# Patient Record
Sex: Male | Born: 1970 | Race: Black or African American | Hispanic: No | Marital: Married | Smoking: Former smoker
Health system: Southern US, Community
[De-identification: ages and names within clinical notes are randomized; demographics above are authoritative.]

## PROBLEM LIST (undated history)

## (undated) DIAGNOSIS — G709 Myoneural disorder, unspecified: Secondary | ICD-10-CM

## (undated) DIAGNOSIS — K5090 Crohn's disease, unspecified, without complications: Secondary | ICD-10-CM

## (undated) DIAGNOSIS — G35D Multiple sclerosis, unspecified: Secondary | ICD-10-CM

## (undated) DIAGNOSIS — I4891 Unspecified atrial fibrillation: Secondary | ICD-10-CM

## (undated) DIAGNOSIS — A4902 Methicillin resistant Staphylococcus aureus infection, unspecified site: Secondary | ICD-10-CM

## (undated) DIAGNOSIS — G35 Multiple sclerosis: Secondary | ICD-10-CM

## (undated) DIAGNOSIS — K5010 Crohn's disease of large intestine without complications: Secondary | ICD-10-CM

## (undated) DIAGNOSIS — Z9049 Acquired absence of other specified parts of digestive tract: Secondary | ICD-10-CM

## (undated) DIAGNOSIS — K56699 Other intestinal obstruction unspecified as to partial versus complete obstruction: Secondary | ICD-10-CM

## (undated) DIAGNOSIS — K624 Stenosis of anus and rectum: Secondary | ICD-10-CM

## (undated) DIAGNOSIS — Q250 Patent ductus arteriosus: Secondary | ICD-10-CM

## (undated) DIAGNOSIS — K603 Anal fistula: Secondary | ICD-10-CM

## (undated) DIAGNOSIS — Z86010 Personal history of colonic polyps: Secondary | ICD-10-CM

## (undated) HISTORY — DX: Multiple sclerosis, unspecified: G35.D

## (undated) HISTORY — DX: Patent ductus arteriosus: Q25.0

## (undated) HISTORY — DX: Unspecified atrial fibrillation: I48.91

## (undated) HISTORY — DX: Crohn's disease, unspecified, without complications: K50.90

## (undated) HISTORY — DX: Other intestinal obstruction unspecified as to partial versus complete obstruction: K56.699

## (undated) HISTORY — DX: Acquired absence of other specified parts of digestive tract: Z90.49

## (undated) HISTORY — DX: Personal history of colonic polyps: Z86.010

## (undated) HISTORY — DX: Stenosis of anus and rectum: K62.4

## (undated) HISTORY — DX: Myoneural disorder, unspecified: G70.9

## (undated) HISTORY — DX: Crohn's disease of large intestine without complications: K50.10

## (undated) HISTORY — DX: Multiple sclerosis: G35

## (undated) HISTORY — DX: Anal fistula: K60.3

## (undated) HISTORY — PX: COLONOSCOPY: SHX174

---

## 1994-10-24 DIAGNOSIS — K603 Anal fistula, unspecified: Secondary | ICD-10-CM

## 1994-10-24 HISTORY — DX: Anal fistula: K60.3

## 1994-10-24 HISTORY — DX: Anal fistula, unspecified: K60.30

## 2001-06-01 ENCOUNTER — Other Ambulatory Visit: Admission: RE | Admit: 2001-06-01 | Discharge: 2001-06-01 | Payer: Self-pay | Admitting: Gastroenterology

## 2001-06-01 ENCOUNTER — Encounter (INDEPENDENT_AMBULATORY_CARE_PROVIDER_SITE_OTHER): Payer: Self-pay | Admitting: Specialist

## 2005-01-20 ENCOUNTER — Ambulatory Visit: Payer: Self-pay | Admitting: Internal Medicine

## 2005-01-28 ENCOUNTER — Ambulatory Visit: Payer: Self-pay | Admitting: Cardiology

## 2005-02-16 ENCOUNTER — Ambulatory Visit: Payer: Self-pay | Admitting: Gastroenterology

## 2005-04-05 ENCOUNTER — Ambulatory Visit: Payer: Self-pay | Admitting: Gastroenterology

## 2005-04-14 ENCOUNTER — Ambulatory Visit: Payer: Self-pay | Admitting: Gastroenterology

## 2005-04-14 ENCOUNTER — Encounter (INDEPENDENT_AMBULATORY_CARE_PROVIDER_SITE_OTHER): Payer: Self-pay | Admitting: Specialist

## 2005-04-15 ENCOUNTER — Encounter: Payer: Self-pay | Admitting: Internal Medicine

## 2005-04-15 ENCOUNTER — Ambulatory Visit: Payer: Self-pay | Admitting: Gastroenterology

## 2005-04-15 ENCOUNTER — Inpatient Hospital Stay (HOSPITAL_COMMUNITY): Admission: EM | Admit: 2005-04-15 | Discharge: 2005-04-18 | Payer: Self-pay | Admitting: Emergency Medicine

## 2005-05-11 ENCOUNTER — Ambulatory Visit: Payer: Self-pay | Admitting: Gastroenterology

## 2005-06-13 ENCOUNTER — Encounter: Payer: Self-pay | Admitting: Internal Medicine

## 2005-06-23 ENCOUNTER — Encounter: Payer: Self-pay | Admitting: Internal Medicine

## 2005-08-04 ENCOUNTER — Encounter: Payer: Self-pay | Admitting: Internal Medicine

## 2006-01-04 ENCOUNTER — Ambulatory Visit: Payer: Self-pay | Admitting: Gastroenterology

## 2006-09-13 ENCOUNTER — Ambulatory Visit: Payer: Self-pay | Admitting: Gastroenterology

## 2006-11-26 ENCOUNTER — Emergency Department (HOSPITAL_COMMUNITY): Admission: EM | Admit: 2006-11-26 | Discharge: 2006-11-26 | Payer: Self-pay | Admitting: Family Medicine

## 2006-11-28 ENCOUNTER — Emergency Department (HOSPITAL_COMMUNITY): Admission: EM | Admit: 2006-11-28 | Discharge: 2006-11-28 | Payer: Self-pay | Admitting: Family Medicine

## 2007-01-11 ENCOUNTER — Ambulatory Visit: Payer: Self-pay | Admitting: Cardiology

## 2007-01-24 ENCOUNTER — Encounter: Payer: Self-pay | Admitting: Internal Medicine

## 2007-01-24 ENCOUNTER — Ambulatory Visit: Payer: Self-pay

## 2007-01-31 ENCOUNTER — Encounter: Admission: RE | Admit: 2007-01-31 | Discharge: 2007-05-01 | Payer: Self-pay | Admitting: Neurology

## 2008-12-11 ENCOUNTER — Ambulatory Visit: Payer: Self-pay | Admitting: Internal Medicine

## 2008-12-11 ENCOUNTER — Ambulatory Visit: Payer: Self-pay | Admitting: Family Medicine

## 2008-12-11 DIAGNOSIS — K5010 Crohn's disease of large intestine without complications: Secondary | ICD-10-CM

## 2008-12-11 DIAGNOSIS — G35 Multiple sclerosis: Secondary | ICD-10-CM

## 2008-12-15 ENCOUNTER — Ambulatory Visit: Payer: Self-pay | Admitting: Cardiovascular Disease

## 2009-01-07 ENCOUNTER — Ambulatory Visit: Payer: Self-pay | Admitting: Internal Medicine

## 2009-01-14 ENCOUNTER — Ambulatory Visit: Payer: Self-pay | Admitting: Internal Medicine

## 2009-01-14 ENCOUNTER — Encounter: Payer: Self-pay | Admitting: Internal Medicine

## 2009-01-22 ENCOUNTER — Encounter: Payer: Self-pay | Admitting: Internal Medicine

## 2009-02-19 ENCOUNTER — Encounter: Payer: Self-pay | Admitting: Internal Medicine

## 2009-02-23 ENCOUNTER — Telehealth: Payer: Self-pay | Admitting: Internal Medicine

## 2009-03-11 ENCOUNTER — Ambulatory Visit: Payer: Self-pay | Admitting: Internal Medicine

## 2009-03-14 ENCOUNTER — Encounter: Payer: Self-pay | Admitting: Internal Medicine

## 2009-04-08 ENCOUNTER — Encounter: Payer: Self-pay | Admitting: Internal Medicine

## 2010-10-21 ENCOUNTER — Telehealth: Payer: Self-pay | Admitting: Internal Medicine

## 2010-10-22 ENCOUNTER — Encounter (INDEPENDENT_AMBULATORY_CARE_PROVIDER_SITE_OTHER): Payer: Self-pay | Admitting: *Deleted

## 2010-11-21 LAB — CONVERTED CEMR LAB
Basophils Absolute: 0 10*3/uL (ref 0.0–0.1)
Basophils Relative: 0.1 % (ref 0.0–3.0)
CO2: 31 meq/L (ref 19–32)
Creatinine, Ser: 1 mg/dL (ref 0.4–1.5)
GFR calc Af Amer: 107 mL/min
GFR calc non Af Amer: 88 mL/min
Glucose, Bld: 94 mg/dL (ref 70–99)
HCT: 39.9 % (ref 39.0–52.0)
Hemoglobin: 13.6 g/dL (ref 13.0–17.0)
Lymphocytes Relative: 20.3 % (ref 12.0–46.0)
MCHC: 34.2 g/dL (ref 30.0–36.0)
Monocytes Relative: 5.1 % (ref 3.0–12.0)
Neutro Abs: 4.1 10*3/uL (ref 1.4–7.7)
RBC: 4.77 M/uL (ref 4.22–5.81)
Total Bilirubin: 1.5 mg/dL — ABNORMAL HIGH (ref 0.3–1.2)
Vit D, 25-Hydroxy: 16 ng/mL — ABNORMAL LOW (ref 30–89)

## 2010-11-25 NOTE — Letter (Signed)
Summary: Appt Reminder [facility]  [place], [place] [address]   Phone: [phone]  Fax: [phone]        October 22, 2010 MRN: [id number]    JOHNTHAN AXTMAN [address] [place], [place]  [address]    Dear Mr. Partin,   You have a return appointment with Dr. Carlean Purl on 11/22/2010 at 1:30pm. Please remember to bring a complete list of the medicines you are taking, your insurance card and your co-pay. If you have to cancel or reschedule this appointment, please call before 5:00 pm the evening before to avoid a cancellation fee. If you have any questions or concerns, please call [phone].    Sincerely,    Bernita Buffy CMA (AAMA)

## 2010-11-25 NOTE — Progress Notes (Signed)
Summary: recall project  ---- Converted from flag ---- ---- 10/21/2010 11:49 AM, Bernita Buffy CMA (AAMA) wrote: this patient is on the recall project list, the last office visit does not mention a recall colonoscopy. Does he still need a colonoscopy? ------------------------------  Phone Note Outgoing Call Call back at [facility] Phone [phone]   Call placed by: Bernita Buffy CMA Deborra Medina),  October 21, 2010 2:47 PM Call placed to: Patient Summary of Call: he needs a phone call to see if he plans to return to our practice and if so an office visit. If he has had other care at [facility], etc, will need records.  Initial call taken by: Gatha Mayer MD, [facility]  October 21, 2010 12:04 PM  Follow-up for Phone Call        Left a message on patients machine to call back. Bernita Buffy CMA Deborra Medina)  October 21, 2010 2:48 PM   spoke to pt he states that the last time he saw a MD at [facility] was 03/2009 and he has been dealing with his MS. I have made him an office visit with Dr. Carlean Purl for the first open slot on 11/22/2010 and I will be mailing him a reminder letter as well.  Follow-up by: Bernita Buffy CMA Deborra Medina),  October 22, 2010 12:27 PM

## 2010-12-03 ENCOUNTER — Encounter: Payer: Self-pay | Admitting: Internal Medicine

## 2010-12-03 ENCOUNTER — Ambulatory Visit (INDEPENDENT_AMBULATORY_CARE_PROVIDER_SITE_OTHER): Payer: Medicare Other | Admitting: Internal Medicine

## 2010-12-03 DIAGNOSIS — G35 Multiple sclerosis: Secondary | ICD-10-CM

## 2010-12-03 DIAGNOSIS — K5010 Crohn's disease of large intestine without complications: Secondary | ICD-10-CM

## 2010-12-03 DIAGNOSIS — K56609 Unspecified intestinal obstruction, unspecified as to partial versus complete obstruction: Secondary | ICD-10-CM

## 2010-12-09 NOTE — Assessment & Plan Note (Addendum)
Summary: Discuss Colonoscopy   History of Present Illness Visit Type: Follow-up Visit Primary GI MD: Silvano Rusk MD [facility] Primary Provider: Juluis Pitch, MD Requesting Provider: n/a Chief Complaint: Discuss Colonoscopy, No GI complaints History of Present Illness:   40 yo African American man last seen in 2010 at which time he was sent to [facility] for surgical consultation. He has a chronic left colon stricture that makes colonoscopy impossible to complete. He was and is not interested in pursuing surgery. He continues on Avonex for his Crohn's and saays he has no bowel or abdominal problems.  He is now on disability due to MS. He had MS relapse 7 months ago but lately has been ok (no numbness, dizziness and balance problems).   GI Review of Systems      Denies abdominal pain, acid reflux, belching, bloating, chest pain, dysphagia with liquids, dysphagia with solids, heartburn, loss of appetite, nausea, vomiting, vomiting blood, weight loss, and  weight gain.        Denies anal fissure, black tarry stools, change in bowel habit, constipation, diarrhea, diverticulosis, fecal incontinence, heme positive stool, hemorrhoids, irritable bowel syndrome, jaundice, light color stool, liver problems, rectal bleeding, and  rectal pain.    Current Medications (verified): 1)  Avonex 30 Mcg/vial Kit (Interferon Beta-1a) .Marland Kitchen.. 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  Alcohol Use - no Illicit Drug Use - no Occupation: disabled, [employer] in past Married, son (1)  and daughter (2) Daily Caffeine Use - 2-3 sodas  caffeine free Patient does not get regular exercise.   Social History: Patient has never smoked.  Alcohol Use - no Illicit Drug Use - no Occupation: disabled, [employer] in past Married, son (1)  and daughter (2) Daily Caffeine Use - 2-3 sodas  caffeine free Patient does not get regular exercise.   Vital Signs:  Patient profile:   40 year old male Height:      72 inches Weight:      181 pounds BMI:     24.64 BSA:     2.04 Pulse rate:   84 / minute Pulse rhythm:   regular BP sitting:   118 / 58  (left arm)  Vitals Entered By: Jonathon Vazquez) (December 03, 2010 10:47 AM)  Physical Exam  General:  Well developed, well nourished, no acute distress. Eyes:  anicteric Lungs:  Clear throughout to auscultation. Heart:  Regular rate and rhythm; no murmurs, rubs,  or bruits. Abdomen:  Soft, nontender and nondistended. No masses, hepatosplenomegaly or hernias noted. Normal bowel sounds.   Impression & Recommendations:  Problem # 1:  CROHN'S DISEASE, LARGE INTESTINE (ICD-555.1) Assessment Unchanged This appears improved and under some control on Avonex (takes for multiple sclerosis). The descending colon stricture is his main problem but that seems asymptomatic. Main issue is the cancer risk there and inability to screen and due surveillance. Avonex has been used in Crohn's trials but is not established therapy. However, I think he is getting benefit and would not add other medication  at this time. He should have a colonoscopy attempted and biopsies of the stricture taken given malignancy poetential. He says he will call back to do in April. Will request labs from [facility].  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 Understandable though it limits ability to adequately screen and do surveillance of the colon.   Problem # 3:  MULTIPLE SCLEROSIS (ICD-340) Assessment: Unchanged Seems ok for the most part on Avonex, though he is now disabled due to [place].  Patient Instructions: 1)  You are due for a colonoscopy. We will contact you in 2-3 weeks once we get our schedule out for April and will discuss colonoscopy at that time with you. 2)  We will obtain labs from the last 6 months from [facility]. 3)  The medication list was reviewed and reconciled.  All changed / newly prescribed medications were explained.  A complete medication list was provided to the patient / caregiver. cc: W. Margaretmary Eddy, MD and [facility].  Appended Document: Discuss Colonoscopy we asked local neuro and his PCP no labs in last year will obtain when he returns for colonoscopy

## 2010-12-22 ENCOUNTER — Telehealth: Payer: Self-pay | Admitting: Internal Medicine

## 2010-12-28 ENCOUNTER — Encounter: Payer: Self-pay | Admitting: Internal Medicine

## 2011-01-04 NOTE — Progress Notes (Signed)
Summary: Schedule Colonoscopy  Phone Note Outgoing Call   Call placed by: Madlyn Frankel CMA Deborra Medina),  December 22, 2010 9:01 AM Call placed to: Patient Summary of Call: Left message on patient's voicemail for him to call back. Patient needs to schedule colonoscopy as per Dr Celesta Aver recommendations at patients recent office visit. Initial call taken by: Madlyn Frankel CMA Deborra Medina),  December 22, 2010 9:02 AM  Follow-up for Phone Call        I have left a message for the patient to call back. Dottie Nelson-Thull CMA Deborra Medina)  December 24, 2010 8:13 AM   I have left a message for the patient to call back. Dottie Nelson-Ruegg CMA Deborra Medina)  December 27, 2010 11:50 AM   Additional Follow-up for Phone Call Additional follow up Details #1::        3 attempts to reach patient. No return call. I will send a letter to patient's home address. Dottie Nelson-Barney CMA Deborra Medina)  December 28, 2010 12:08 PM

## 2011-01-04 NOTE — Letter (Signed)
Summary: Colonoscopy Letter  [facility]  [place], [place] [address]   Phone: [phone]  Fax: [phone]      December 28, 2010 MRN: [id number]   Jonathon Vazquez, [place]  [address]   Dear [name],   According to your medical record, it is time for you to schedule a Colonoscopy. Our office has tried to contact you on multiple occasions to schedule this but unfortunately, we have been unable to do so. The American Cancer Society recommends this procedure as a method to detect early colon cancer. Patients with a family history of colon cancer, or a personal history of colon polyps or inflammatory bowel disease are at increased risk.  This letter has been generated based on the recommendations made at the time of your procedure. If you feel that in your particular situation this may no longer apply, please contact our office.  Please call our office at [phone] to schedule this appointment or to update your records at your earliest convenience.  Thank you for cooperating with [place] to provide you with the very best care possible.   Sincerely,    [facility] [phone]  Appended Document: Colonoscopy Letter letter mailed to patient's home address.

## 2011-03-11 NOTE — H&P (Signed)
Jonathon Vazquez, ROHRBACH NO.:  [id number]   MEDICAL RECORD NO.:  [id number]          PATIENT TYPE:  INP   LOCATION:  3014                         FACILITY:  [facility]   PHYSICIAN:  Norberto Sorenson T. Fuller Plan, M.D. [facility] OF BIRTH:  September 03, 1971   DATE OF ADMISSION:  04/14/2005  DATE OF DISCHARGE:                                HISTORY & PHYSICAL   GASTROENTEROLOGIST: Dr. Lyla Son.   CHIEF COMPLAINT:  Temperatures with abdominal pain and nausea, vomiting,  following a colonoscopy earlier on the day of admission.   HISTORY OF PRESENT ILLNESS:  This is a very nice, 40 year old African-  American gentleman with an approximately 11-year history of Crohn's disease.  Apparently he has had some fistulous disease in the past but has never been  on Remicade. He has been taking Asacol for a long time and has required  steroids in the past, but since being transitioned over to a daily dosage of  75 mg of 6-MP, he has not needed steroids. He has been pretty asymptomatic  from the stand-point of his Crohn's with no significant diarrheal episodes,  gaining weight, having a good appetite and no constitutional symptoms. He is  able to hold down a full time and a part-time job.   Yesterday he underwent a screening colonoscopy by Dr. Lyla Son. During  that colonoscopy Dr. Inocente Salles found active Crohn's disease and several areas of  stricturing and stenosis associated with the Crohn's disease at one point.  We believe this was in the ascending colon. He was unable to pass the scope  beyond the strictured area. At its most severe, the Crohn's disease was  described as severe. Biopsies were obtained. In the postoperative area he  did pass some bloody diarrhea before he went home.   The patient went home from the endoscopy center and that evening (Thursday,  April 14, 2005) developed a temperature of 101 degrees. His wife, who is an  R. N. at [employer], and also does relief nursing at [employer] on the rehab unit, called Dr. Fuller Plan covering for GI service. Dr.  Fuller Plan recommended Tylenol. He did say that if the patient developed any  further abdominal pain, bleeding or nausea and vomiting, or if the fevers  persisted, that she should call Dr. Fuller Plan back. At the time of that first  call the patient was not having any GI symptoms, just the fever. Shortly  thereafter he developed chills and right-sided abdominal pain, proceeding  then to nausea and vomiting. The family was then in contact again with Dr.  Fuller Plan and it was recommended that he come to the emergency room for further  evaluation. A CT scan of the abdomen did reveal left-sided colitis. Plain  abdominal films as well as the CT scan did not confirm any perforation. Once  the patient got to the emergency room he did pass a large amount of gas,  which he had been unable to do at home, and he did feel better. His  temperature initially in the ER was 104.8 with a pulse of 115 and  blood  pressure 118/61. At one point in the emergency room his blood pressure did  go as low as 84/30.   Dr. Fuller Plan evaluated the patient and admitted him for further work up and  observation. Note that Dr. Velora Heckler had wanted to increase this patient's  dose of 6-MP from 75 mg daily up to 100 mg daily but the patient has not  started this increased dose yet.   PAST MEDICAL HISTORY:  Crohn's disease. Has not required surgery in the  past.   CURRENT MEDICATIONS:  1.  Asacol 1.2 grams p.o. t.i.d.  2.  6-MP 75 mg daily but was to be increased to 100 mg daily.  3.  Multivitamin once daily when he remembers but this is usually 2-3 times      a week.   ALLERGIES:  No known drug or environmental allergies.   SOCIAL HISTORY:  The patient works at the [employer] in [place],  [place]. He also works as a Adult nurse. Does not drink alcohol.  He lives with his wife. He has a 22-year-old child from a prior marriage but  this child  does not live with the patient.   FAMILY HISTORY:  Several of his grandparents had diabetes mellitus type 2.  His paternal grandfather had prostate cancer. His paternal grandmother has  renal cell carcinoma and diabetes mellitus type 2.   REVIEW OF SYSTEMS:  CONSTITUTIONAL:  Fevers, sweats, rigors as described  above. Some weakness, no presyncope. GASTROINTESTINAL: Abdominal pain has  predominantly been on the right side of the abdomen but seems to be  radiating over to the left side at times. It is probably about a 7 or 8 out  of 10 in severity. UROLOGIC:  The patient is urinating well, has not noticed  any oliguria. GENERAL: The patient reports about a 10 pound weight gain in  the last year. ENDOCRINE:  The patient denies any personal history of  diabetes, excessive thirst or excessive urination. CARDIOVASCULAR:  No  palpitations, no history of angina or chest pain. No peripheral edema. The  patient does report he had a heart murmur as a child.  MUSCULOSKELETAL:  No  arthralgias. The patient does not exercise regularly.   PHYSICAL EXAMINATION:  GENERAL:  The patient is a pleasant, mildly  uncomfortable African-American man who looks non toxic. He is a good  historian.  VITAL SIGNS:  Blood pressure is 106/44, pulse is 70, respirations 20,  temperature max 104.8. By the time of the actual exam by Dr. Fuller Plan, 98.5.  HEENT:  Sclerae are non icteric. Conjunctivae pink. Extraocular movements  are intact.  Oropharynx is moist and clear. Dentition in good repair.  NECK:  No jugular venous distension, no masses, no bruits.  CHEST:  Clear to auscultation and percussion with excellent breath sounds,  no cough and no dyspnea with speech.  HEART:  Regular rate and rhythm, questionable systolic click.  ABDOMEN:  Mild right abdominal tenderness. The abdomen is soft and  nondistended. No guarding or rebound. No masses or hepatosplenomegaly and no  hernia is noted. RECTAL EXAM:  Deferred as he just  had a colonoscopy on April 14, 2005.  NEUROLOGIC EXAM:  The patient is alert and oriented x3. He is moving all  four extremities. Grip and pedal strength are 5/5 bilaterally. No tremor is  noted.  PSYCHIATRIC:  The patient is a little bit subdued but does not appear  depressed. He is appropriate.  GU:  Exam was deferred.   IMAGING  STUDIES:  Plain abdominal films show no obstruction or free air but  some fluid in the proximal colon. Abdomen and pelvic CT shows two segments  of colonic wall thickening with inflammatory edematous type changes in the  descending colon. No free air or bowel distention.   LABORATORY DATA:  White blood cell count 8 thousand, hemoglobin 13.5,  hematocrit 40.7. MCV 88.7. Platelets 149,000. Urinalysis: no nitrites, no  leukocyte esterase, negative for protein, 15 mg of ketones are present.  There is a small amount of bilirubin, no glucose present and specific  gravity is within normal limits. Sodium is 135, potassium is 2.8, chloride  is 105. CO2 23. Glucose 103. BUN is 14, creatinine is 1.2. Total bilirubin  2.0, alkaline phosphatase 74, AST 20, ALT 18. Albumin is 3.4. Lipase is 31.   IMPRESSION:  1.  Crohn's disease with colonoscopy within the last 24 hours revealing      active Crohn's and stricturing. Now with high fever following      colonoscopy, and right-sided abdominal pain along with nausea and      vomiting. Rule out bacterial translocation/bacteremia, sepsis. Currently      the white blood cell count is normal. There is no evidence for any      perforation of the bowel.  2.  Hypokalemia.   PLAN:  Admit patient to the hospital on a non monitored bed. Obtain blood  cultures. Will begin empiric antibiotics, specifically Primaxin. Will  continue his oral Asacol and 6-MP. Will supplement potassium in his IV  fluids. Plan to repeat labs: CBC and BMET along with sedimentation rate in  several hours.       SG/MEDQ  D:  04/15/2005  T:  04/15/2005   Job:  [id number]

## 2011-03-11 NOTE — Discharge Summary (Signed)
NAMEJHASE, Jonathon Vazquez NO.:  [id number]   MEDICAL RECORD NO.:  [id number]          PATIENT TYPE:  INP   LOCATION:  3014                         FACILITY:  [facility]   PHYSICIAN:  Delfin Edis, M.D. [facility]  DATE OF BIRTH:  1971-09-21   DATE OF ADMISSION:  04/14/2005  DATE OF DISCHARGE:  04/18/2005                                 DISCHARGE SUMMARY   ADMISSION DIAGNOSES:  1.  High fevers, abdominal pain, nausea and vomiting following colonoscopy.  2.  Colonoscopy on April 14, 2005, revealing active Crohn's disease and      stricturing.  3.  Hypokalemia.  4.  Longstanding history of Crohn's disease. Has not required surgery in the      past.   DISCHARGE DIAGNOSES:  1.  Fevers, abdominal pain, nausea and vomiting following colonoscopy.      Question transient bacteremia from translocation of bacteria via the      colon. Fevers resolved. White blood cell count normal throughout the      hospital stay. No evidence for Clostridium difficile colitis. Imaging      studies unremarkable. Fevers and symptoms resolved.  2.  Hypokalemia, corrected, resolved.   CONSULTATIONS:  None.   PROCEDURES:  None.   BRIEF HISTORY:  Jonathon Vazquez is a very nice, 40 year old, American gentleman.  He has about an 11-year history of Crohn's disease and says that he has had  some fistulous disease in the past, but has not been treated with Remicade  previously. He has been on Asacol for many years and has required steroids  in the past. However, he was started on 6MP about a year ago and has been  able to taper off steroid use as a result. Crohn's disease has been quiet  for several months with no diarrheal episodes and no bloody stools. He has  been gaining weight and appetite is good. The patient underwent screening  colonoscopy by Dr. Lyla Son on April 14, 2005. Findings included active  Crohn's disease and several areas of stricturing and stenosis associated  with the Crohn's disease.  Apparently in the descending colon, Dr. Velora Heckler  was unable to pass the scope beyond a strictured region. At its most severe,  the disease was described as severe. Biopsies were obtained, but are still  pending. He had some bloody diarrhea before he went home from the  colonoscopy. That very evening, the patient developed an initial temperature  spike of 101 degrees and Dr. Fuller Plan, on call, was contacted. He advised them  to use some Tylenol, but to call back if there are any other problems. A few  hours later, the patient's wife called saying the patient was having chills  and was now having right-sided abdominal pain, as well as nausea and  vomiting. The patient was advised to come to the emergency room. On initial  x-rays, there was no evidence for any free air or perforation. There was a  nonobstructive bowel gas pattern and some fluid contents in the proximal  colon. CT scan showed focal colitis in the descending colon. Again there was  no evidence for any perforation or obstructive pattern. In the emergency  room, his initial temperature reading was 104.8 degrees. His white blood  cell count was, however, normal, though the differential did reveal a left  shift. Urinalysis was negative. The patient was quite uncomfortable and Dr.  Fuller Plan admitted him for observation and supportive care. Blood cultures were  obtained and the patient was then started on Primaxin.   LABORATORIES:  Clostridium difficile toxin assay was negative. The initial  white blood cell count was 8, it was 4.2 at discharge. The hematocrit was  38.7 and MCV 87.5. The differential on white blood cells showed a left shift  with 93% neutrophils, though the absolute count normal and lymphocytes  decreased at 2% with the absolute count also decreased. Other white cell  lines were within normal limits. The sedimentation rate was minimally  elevated at 18. Sodium 135. Initial potassium 2.8, corrected to 3.8.  Chloride 105,  CO2 23, glucose 103, BUN 14, creatinine 1.2, calcium 8.3,  albumin 3.4. Total bilirubin 2, alkaline phosphatase 74, AST 20, ALT 18.  Lipase 31. Urinalysis revealed no nitrites, no leukocyte esterase and  negative for protein. There were 15 mg of ketones. Specific gravity within  normal limits. No growth on blood cultures. Urine culture also with no  growth. Clostridium difficile toxin assay was checked twice and was both  times negative.   IMAGING STUDIES:  Described above in history of present illness.   HOSPITAL COURSE:  The patient was admitted to a nontelemetry bed. Within the  first 16 hours, the patient did continue to have temperatures up to 102.4  degrees, but never surpassed the initial temperature of 104.8 degrees  registered in the emergency room. On April 16, 2005, and thereafter, he was  afebrile. Laboratories as outlined above were unremarkable as to source of  the patient's fever.   Within the first 18 hours or so, he had diarrhea all night long, but this,  along with the fevers resolved within 24 hours. Diet was slowly advanced  from ice chips and sips up to a low-residue diet. He tolerated diet  progression without problems. Primaxin, started empirically, was continued  for the duration of his hospital stay, but that it was not felt that he  would need antibiotics when discharge as he had improved so rapidly and had  remained afebrile  for more than 48 hours.   During this hospitalization the patient's 6MP dosage was increased from the  previous 75 mg a day up to 100 mg a day which had been recommended by Dr.  Lyla Son. Levels of these drugs will need to be checked within the next  month or so. Other considerations for future treatment in this patient are  Remicade or Humira. All in all, the patient's condition improved and he was  stable for discharge on April 18, 2005. The overall working diagnosis as to cause of his acute fevers and GI symptoms was transient  bacteremia from  translocation of intestinal bacteria.   MEDICATIONS AT DISCHARGE:  1.  Asacol 400 mg four tablets three times daily.  2.  MP6 50 mg two tablets daily.       SG/MEDQ  D:  04/18/2005  T:  04/18/2005  Job:  [id number]   cc:   Clarene Reamer, M.D. [facility]

## 2011-03-11 NOTE — Letter (Signed)
September 13, 2006    Candis Shine, M.D.  [facility]  [address], [place] [address]   RE:  Jonathon Vazquez, Jonathon Vazquez  MRN:  [id number]  /  DOB:  1971-01-18   Dear Mechele Claude:   I would appreciate it if you would see this very nice young man, Jonathon Vazquez, some time in the near future.  I told him you probably could see him  some time in January or early February.  I just wanted to make sure that his  condition is not worsening.  He is so stoic that it is hard to really know  where he stands with his Crohn's.  Hopefully he is doing well with the 6-MP  but it is hard to be sure.   Again, thank you for your care.    Sincerely,      Clarene Reamer, MD  Electronically Signed    SML/MedQ  DD: 09/13/2006  DT: 09/13/2006  Job #: [id number]   CC:    along with this letter, both done today. Send copy of office note on  this patient

## 2011-03-11 NOTE — Assessment & Plan Note (Signed)
[place] OFFICE NOTE   GANNON, HEINZMAN                        MRN:          [id number]  DATE:01/11/2007                            DOB:          [birth year]    Mr. Bernards is a 40 year old male with past medical history of Crohn's as  well as a patent ductus arteriosus we were asked to evaluate prior to  consideration of life insurance.  The patient does have cardiac history  and I saw the patient in 1998.  Note that at that time he had been seen  in [place] and an electrocardiogram revealed atrial fibrillation with a  controlled ventricular response.  He did have an echocardiogram on Feb 27, 1997.  He was found to have low-normal LV function with an estimated  ejection fraction of 50-55%.  There was mild left ventricular  hypertrophy and mild left ventricular enlargement.  The right atrium and  right ventricle were felt to be mildly enlarged.  The pulmonary artery  was also dilated.  By color, there was a patent ductus arteriosus.  We  did refer the patient to [facility] so that he could see Dr. Jenne Pane for this PDA.  He apparently was seen but I do not have those  records.  He was told that Dr. Aline Brochure did find his PDA but felt that  it did not need therapy as there were no symptoms.  He has not followed  up at [facility] or in this clinic since.  The patient recently  applied for life insurance and they asked for him to be evaluated prior  to that.  Note he denies any dyspnea on exertion, orthopnea, PND, pedal  edema, palpitations, presyncope, syncope, or exertional chest pain.   His medications include:  1. Lialda 1.2 p.o. daily.  2. Mercaptopurine 50 mg tablets two p.o. daily.  3. Multivitamin one p.o. daily.   PAST MEDICAL HISTORY:  There is no diabetes mellitus, hypertension or  hyperlipidemia.  He does have a history of Crohn's disease.  He also has  a history of atrial  fibrillation when I saw him in May 1998.  He has a  history of a patent ductus arteriosus.  There is no previous surgical  history.   FAMILY HISTORY:  Negative for coronary artery disease.   SOCIAL HISTORY:  He has a remote history of tobacco use but he does not  consume alcohol or use drugs.   REVIEW OF SYSTEMS:  He denies any headaches or fevers or chills.  There  is no productive cough or hemoptysis.  There is no productive cough or  hemoptysis.  There is no dysphagia, odynophagia, or melena.  He does  occasionally have hematochezia related to his Crohn's.  There is no  dysuria or hematuria.  There is no rash or seizure activity.  There is  no orthopnea or PND noted.  There is no claudication noted.  The  remaining systems are negative.   PHYSICAL EXAMINATION:  VITAL SIGNS:  Shows a blood pressure 125/73 and  his pulse  is 15.  He weighs 200 pounds.  GENERAL:  He is well-developed and well-nourished in no acute distress.  His skin is warm and dry.  He does not appear to be depressed and there  is no gross focal abnormality.  HEENT:  Unremarkable with normal eyelids.  NECK:  Supple with an normal upstroke bilaterally and there are no  bruits noted.  There is no jugular venous distention and I cannot  appreciate thyromegaly.  CHEST:  Clear to auscultation with normal expansion.  CARDIOVASCULAR:  Reveals a regular rate and rhythm with a normal S1.  S2  appears to be widely split.  I cannot appreciate murmurs.  There is no  S3 or S4.  I can appreciate no RV heave and his PMI is nondisplaced.  ABDOMEN:  Not tender or distended, positive bowel sounds.  No  hepatosplenomegaly, no mass appreciated.  There is no abdominal bruit.  He has 2+ femoral pulses bilaterally and no bruits.  EXTREMITIES:  Show no edema and I can palpate no cords.  He has 2+  posterior tibial pulses bilaterally.  NEUROLOGIC:  Grossly intact.   His electrocardiogram shows a sinus rhythm at a rate of 91 and there  are  no significant ST changes noted.   DIAGNOSES:  1. History of patent ductus arteriosus.  2. Remote history of atrial fibrillation.  3. Crohn's disease.   PLAN:  Mr. Geissinger presents for evaluation of his patent ductus arteriosus  prior to consideration of life insurance.  Note he has no symptoms  including no dyspnea, palpitations, chest pain or syncope.  On exam  today I do not hear a PDA.  We will schedule him to have an  echocardiogram to reassess his PDA and also obtain records from his  evaluation at [facility] in 1998.  I think if his PDA has closed  then no further cardiac would be indicated.  We will see him back in  approximately 2 years.     Denice Bors Stanford Breed, MD, [facility]  Electronically Signed    BSC/MedQ  DD: 01/11/2007  DT: 01/11/2007  Job #: [id number]   cc:   Robyn Haber, M.D.

## 2011-03-11 NOTE — Assessment & Plan Note (Signed)
[place] OFFICE NOTE   Jonathon Vazquez, Jonathon Vazquez                        MRN:          [id number]  DATE:09/13/2006                            DOB:          Oct 10, 1971    He has Crohn's colitis.  He has been evaluated by Dr. Candis Shine down at  [facility] and there was a question about Remicade but we have kept him on 100 mg  of 6-MP and he says he has been taking it along with some Asacol four t.i.d.  and a stool softener.  He says he has been feeling well, that is why he has  not been back.  He just needs refills on his medication.   PHYSICAL EXAMINATION TODAY:  VITAL SIGNS:  He weighed 189, blood pressure  106/62, pulse 68.  GENERAL:  Awake, alert, appeared very good.  HEENT:  His oropharynx was negative.  NECK:  Negative.  CHEST:  Clear.  HEART:  Revealed regular rhythm without significant murmur.  ABDOMEN:  Soft, no mass or organomegaly.  SKIN:  Clear.  EXTREMITIES:  Unremarkable.  RECTAL:  Deferred.   IMPRESSION:  1. Crohn's colitis, responding nicely to the 6-MP it seems, although Jonathon Vazquez      is quite stoic.  2. History of paroxysmal atrial fibrillation.  3. History of mitral stenosis in the past.  4. Gastroesophageal reflux disease.   RECOMMENDATION:  The patient is to continue the same medication.  I do think  he needs to follow up with Dr. Redmond Pulling down at [facility] and they may think he  needs a repeat colonoscopic examination to reassess his condition.  Jonathon Vazquez is  so stoic it is hard to really know where we stand on his Crohn's.  I do  worry that he could be getting worsening stenosis and he may require surgery  in the future.  I told him I would like to see him back some time after he  sees Dr. Redmond Pulling for followup prior to my retiring in June so that we can  discuss his followup care in the future.  In the meantime, I would keep him  on the same medications.  I have given him refills for this and get  routine  labs on him as well, and he is to call us if he has any change in his  symptomatology.     Clarene Reamer, MD  Electronically Signed    SML/MedQ  DD: 09/13/2006  DT: 09/13/2006  Job #: [id number]   cc:   Candis Shine, MD

## 2011-03-11 NOTE — Letter (Signed)
February 08, 2007     RE:  HOLT, WOOLBRIGHT  MRN:  [id number]  /  DOB:  07-05-1971   To Whom It May Concern:   Reda Gettis is a 40 year old male patient of mine in [place], [place] at [facility] The patient has a patent ductus  arteriosus and wants to be considered for life insurance. We did perform  an echocardiogram on him recently and his LV function was at the lower  limits of normal. He had no significant valvular abnormalities and there  was a patent ductus arteriosus noted. Note, his right atrium and right  ventricle are normal in size and the right ventricular function was  normal. He also has absolutely no symptoms with this. It therefore does  not require any correction at this point and should not affect his life  expectancy. Please feel free to contact me if there are any concerns or  questions concerning this issue.    Sincerely,      Denice Bors. Stanford Breed, MD, [facility]  Electronically Signed    BSC/MedQ  DD: 02/08/2007  DT: 02/09/2007  Job #: [id number]

## 2011-11-04 DIAGNOSIS — R209 Unspecified disturbances of skin sensation: Secondary | ICD-10-CM | POA: Diagnosis not present

## 2011-11-04 DIAGNOSIS — G35 Multiple sclerosis: Secondary | ICD-10-CM | POA: Diagnosis not present

## 2011-11-23 DIAGNOSIS — Z7729 Contact with and (suspected ) exposure to other hazardous substances: Secondary | ICD-10-CM | POA: Diagnosis not present

## 2011-12-27 DIAGNOSIS — G35 Multiple sclerosis: Secondary | ICD-10-CM | POA: Diagnosis not present

## 2011-12-27 DIAGNOSIS — Z79899 Other long term (current) drug therapy: Secondary | ICD-10-CM | POA: Diagnosis not present

## 2011-12-27 DIAGNOSIS — R209 Unspecified disturbances of skin sensation: Secondary | ICD-10-CM | POA: Diagnosis not present

## 2012-01-03 DIAGNOSIS — G35 Multiple sclerosis: Secondary | ICD-10-CM | POA: Diagnosis not present

## 2012-01-03 DIAGNOSIS — H04129 Dry eye syndrome of unspecified lacrimal gland: Secondary | ICD-10-CM | POA: Diagnosis not present

## 2012-01-03 DIAGNOSIS — H40019 Open angle with borderline findings, low risk, unspecified eye: Secondary | ICD-10-CM | POA: Diagnosis not present

## 2012-01-03 DIAGNOSIS — H11159 Pinguecula, unspecified eye: Secondary | ICD-10-CM | POA: Diagnosis not present

## 2012-01-13 DIAGNOSIS — J069 Acute upper respiratory infection, unspecified: Secondary | ICD-10-CM | POA: Diagnosis not present

## 2012-01-13 DIAGNOSIS — J019 Acute sinusitis, unspecified: Secondary | ICD-10-CM | POA: Diagnosis not present

## 2012-01-18 DIAGNOSIS — Z79899 Other long term (current) drug therapy: Secondary | ICD-10-CM | POA: Diagnosis not present

## 2012-01-18 DIAGNOSIS — R209 Unspecified disturbances of skin sensation: Secondary | ICD-10-CM | POA: Diagnosis not present

## 2012-01-18 DIAGNOSIS — G35 Multiple sclerosis: Secondary | ICD-10-CM | POA: Diagnosis not present

## 2012-01-31 DIAGNOSIS — J309 Allergic rhinitis, unspecified: Secondary | ICD-10-CM | POA: Diagnosis not present

## 2012-01-31 DIAGNOSIS — J3489 Other specified disorders of nose and nasal sinuses: Secondary | ICD-10-CM | POA: Diagnosis not present

## 2012-02-08 ENCOUNTER — Encounter: Payer: Self-pay | Admitting: *Deleted

## 2012-02-09 DIAGNOSIS — Z113 Encounter for screening for infections with a predominantly sexual mode of transmission: Secondary | ICD-10-CM | POA: Diagnosis not present

## 2012-02-09 DIAGNOSIS — E785 Hyperlipidemia, unspecified: Secondary | ICD-10-CM | POA: Diagnosis not present

## 2012-02-09 DIAGNOSIS — Z202 Contact with and (suspected) exposure to infections with a predominantly sexual mode of transmission: Secondary | ICD-10-CM | POA: Diagnosis not present

## 2012-02-09 DIAGNOSIS — Z7721 Contact with and (suspected) exposure to potentially hazardous body fluids: Secondary | ICD-10-CM | POA: Diagnosis not present

## 2012-02-09 DIAGNOSIS — G35 Multiple sclerosis: Secondary | ICD-10-CM | POA: Diagnosis not present

## 2012-02-13 ENCOUNTER — Encounter: Payer: Self-pay | Admitting: Internal Medicine

## 2012-02-13 ENCOUNTER — Ambulatory Visit (INDEPENDENT_AMBULATORY_CARE_PROVIDER_SITE_OTHER): Payer: Medicare Other | Admitting: Internal Medicine

## 2012-02-13 VITALS — BP 104/60 | HR 72 | Ht 71.0 in | Wt 183.8 lb

## 2012-02-13 DIAGNOSIS — K5010 Crohn's disease of large intestine without complications: Secondary | ICD-10-CM

## 2012-02-13 DIAGNOSIS — K56609 Unspecified intestinal obstruction, unspecified as to partial versus complete obstruction: Secondary | ICD-10-CM | POA: Diagnosis not present

## 2012-02-13 DIAGNOSIS — K56699 Other intestinal obstruction unspecified as to partial versus complete obstruction: Secondary | ICD-10-CM

## 2012-02-13 DIAGNOSIS — K5900 Constipation, unspecified: Secondary | ICD-10-CM | POA: Diagnosis not present

## 2012-02-13 HISTORY — DX: Other intestinal obstruction unspecified as to partial versus complete obstruction: K56.699

## 2012-02-13 MED ORDER — PEG-KCL-NACL-NASULF-NA ASC-C 100 G PO SOLR: 1.0000 | Freq: Once | ORAL | Status: DC

## 2012-02-13 NOTE — Assessment & Plan Note (Addendum)
Symptomatically deteriorated with starting prednisone. He is having constipation and change in bowel habits presumably related to his colonic stricture from Crohn's disease. Will investigate with colonoscopy.

## 2012-02-13 NOTE — Progress Notes (Signed)
Subjective:    Patient ID: Jonathon Vazquez, male    DOB: 17-Jul-1971, 41 y.o.   MRN: [id number]  HPI This 41 year old [place] American man presents for followup in the setting of Crohn's disease of the colon with known colonic stricture. He was last seen in the lower a year ago. He decided not to schedule colonoscopy at that time. He has been treated with Avonex for multiple sclerosis for a number of years and that seems to have helped his Crohn's disease. Recently he developed a sinus infection. He took some antibiotics which did not seem to clear it. A prednisone taper was prescribed and when he started that he developed constipation and small caliber stools. He started laxative pills with success, he temporarily held as prednisone and has improved. He is now having bowel movements. He is not reporting any perianal problems, fever, nausea, vomiting, abdominal distention or abdominal pain.  No Known Allergies Outpatient Prescriptions Prior to Visit  Medication Sig Dispense Refill  . interferon beta-1a (AVONEX) 30 MCG/0.5ML injection Inject 30 mcg into the muscle every 7 (seven) days.       Past Medical History  Diagnosis Date  . Crohn's disease   . Patent ductus arteriosus   . Atrial fibrillation   . Multiple sclerosis   . Colon stricture     descending colon  . Anal fistula 1996   Past Surgical History  Procedure Date  . Colonoscopy Multiple   History   Social History  . Marital Status: Married    Spouse Name: N/A    Number of Children: 3  . Years of Education: N/A   Occupational History  . disabled    Social History Main Topics  . Smoking status: Never Smoker   . Smokeless tobacco: Never Used  . Alcohol Use: No  . Drug Use: No    Social History Narrative   Married, 1 son one daughter. Wife is Therapist, sports.Disabled, previously worked at [employer], disability due to multiple sclerosis   Family History  Problem Relation Age of Onset  . Prostate cancer Paternal Grandfather   .  Diabetes Paternal Grandmother   . Colon cancer Neg Hx   . Ulcerative colitis Father         Review of Systems As per history of present illness    Objective:   Physical Exam General:  NAD Eyes:   anicteric Lungs:  clear Heart:  S1S2 no rubs, murmurs or gallops Abdomen:  soft and nontender, BS+ Ext:   no edema    Data Reviewed:   Will request copies of recent labs, he says he had blood work at [place]:   1. CROHN'S DISEASE, LARGE INTESTINE   2. Stricture of colon   3. Constipation    He's had a change in bowel habits with new constipation, in the setting of a known colonic stricture related to Crohn's disease. He will continue what he is doing as far as laxatives. He does say he tried some MiraLax which did seem to help. We will schedule a colonoscopy, upper endoscopy will be used given the stricture, history of inability to pass a pediatric colonoscope. Further plans pending the above. Risks benefits and indications were explained to the patient he understands and agrees to proceed. I have explained that there is a cancer risk versus just progression of the disease and stricture affect from inflammation and scarring.   Will send copies to Gillespie, MD  and [facility].

## 2012-02-13 NOTE — Patient Instructions (Addendum)
You have been scheduled for a colonoscopy. Please follow written instructions given to you at your visit today.  Please pick up your prep kit at the pharmacy within the next 1-3 days.  We have requested your labs from [facility].

## 2012-02-22 ENCOUNTER — Encounter: Payer: Self-pay | Admitting: Internal Medicine

## 2012-02-22 ENCOUNTER — Ambulatory Visit (AMBULATORY_SURGERY_CENTER): Payer: Medicare Other | Admitting: Internal Medicine

## 2012-02-22 VITALS — BP 112/62 | HR 54 | Temp 96.7°F | Resp 20 | Ht 71.0 in | Wt 183.0 lb

## 2012-02-22 DIAGNOSIS — K56609 Unspecified intestinal obstruction, unspecified as to partial versus complete obstruction: Secondary | ICD-10-CM | POA: Diagnosis not present

## 2012-02-22 DIAGNOSIS — K5090 Crohn's disease, unspecified, without complications: Secondary | ICD-10-CM | POA: Diagnosis not present

## 2012-02-22 DIAGNOSIS — K633 Ulcer of intestine: Secondary | ICD-10-CM | POA: Diagnosis not present

## 2012-02-22 DIAGNOSIS — K56699 Other intestinal obstruction unspecified as to partial versus complete obstruction: Secondary | ICD-10-CM

## 2012-02-22 DIAGNOSIS — K624 Stenosis of anus and rectum: Secondary | ICD-10-CM | POA: Diagnosis not present

## 2012-02-22 DIAGNOSIS — K5010 Crohn's disease of large intestine without complications: Secondary | ICD-10-CM

## 2012-02-22 MED ORDER — SODIUM CHLORIDE 0.9 % IV SOLN: 500.0000 mL | INTRAVENOUS | Status: DC

## 2012-02-22 NOTE — Op Note (Addendum)
[address] [address] [place], [place]  [address]  COLONOSCOPY PROCEDURE REPORT  PATIENT:  Jonathon Vazquez, Jonathon Vazquez  MR#:  [id number] BIRTHDATE:  1971-01-29, 41 yrs. old  GENDER:  male ENDOSCOPIST:  Gatha Mayer, MD, [facility]  PROCEDURE DATE:  02/22/2012 PROCEDURE:  Colonoscopy with biopsy ASA CLASS:  Class II INDICATIONS:  Crohn's disease change in bowels MEDICATIONS:   These medications were titrated to patient response per physician's verbal order, Fentanyl 50 mcg IV, Versed 5 mg IV  DESCRIPTION OF PROCEDURE:   After the risks benefits and alternatives of the procedure were thoroughly explained, informed consent was obtained.  Digital rectal exam was performed and revealed anal stenosis and normal prostate.   The  Olympus gastroscope  was introduced through the anus and advanced to the terminal ileum which was intubated for a short distance, without limitations.  The quality of the prep was excellent, using MoviPrep.  The instrument was then slowly withdrawn as the colon was fully examined. <<PROCEDUREIMAGES>>  FINDINGS:  A stricture was found in the sigmoid colon. Ulcerated and inflamed stricture, firm and somewhat fixed. 35-38 cm. Multiple biopsies were obtained and sent to pathology.  Stenosis was found in the descending colon. Stenosis with associated diverticulum at 50 cm. Addedum: two ersoins on the ileocecal valve.  This was otherwise a normal examination of the colon and distal terminal ileum.  Retroflexed views in the rectum revealed no abnormalities.    The time to cecum = 6:02 minutes. The scope was then withdrawn in 7:55 minutes from the cecum and the procedure completed. COMPLICATIONS:  None ENDOSCOPIC IMPRESSION: 1) Crohn's stricture in the sigmoid colon - 3 cm long 2) Stenosis in the descending colon - with diverticulum. Suspect prior stricture (healed) 3) Otherwise normal examination to terminal ileum using gastroscope 4) Addendum: two small erosions on  ileocecal valve RECOMMENDATIONS: 1) Await biopsy results 2) will call him with results and plans REPEAT EXAM:  In for Colonoscopy, pending biopsy results.  Gatha Mayer, MD, [facility]  CC:  The Patient and Jenna Luo, MD  n. REVISED:  02/22/2012 12:03 PM eSIGNED:   Gatha Mayer at 02/22/2012 12:03 PM  [place], [phone]

## 2012-02-22 NOTE — Patient Instructions (Addendum)
I was able to complete a colonoscopy using a gastroscope (smaller, usually used for checking the stomach). The colon stricture remaine, looks inflammatory and not like cancer but I took biopsies. I will call with the results and plans. Gatha Mayer, MD, FACG   YOU HAD AN ENDOSCOPIC PROCEDURE TODAY AT [facility]: Refer to the procedure report that was given to you for any specific questions about what was found during the examination.  If the procedure report does not answer your questions, please call your gastroenterologist to clarify.  If you requested that your care partner not be given the details of your procedure findings, then the procedure report has been included in a sealed envelope for you to review at your convenience later.  YOU SHOULD EXPECT: Some feelings of bloating in the abdomen. Passage of more gas than usual.  Walking can help get rid of the air that was put into your GI tract during the procedure and reduce the bloating. If you had a lower endoscopy (such as a colonoscopy or flexible sigmoidoscopy) you may notice spotting of blood in your stool or on the toilet paper. If you underwent a bowel prep for your procedure, then you may not have a normal bowel movement for a few days.  DIET: Your first meal following the procedure should be a light meal and then it is ok to progress to your normal diet.  A half-sandwich or bowl of soup is an example of a good first meal.  Heavy or fried foods are harder to digest and may make you feel nauseous or bloated.  Likewise meals heavy in dairy and vegetables can cause extra gas to form and this can also increase the bloating.  Drink plenty of fluids but you should avoid alcoholic beverages for 24 hours.  ACTIVITY: Your care partner should take you home directly after the procedure.  You should plan to take it easy, moving slowly for the rest of the day.  You can resume normal activity the day after the procedure however you  should NOT DRIVE or use heavy machinery for 24 hours (because of the sedation medicines used during the test).    SYMPTOMS TO REPORT IMMEDIATELY: A gastroenterologist can be reached at any hour.  During normal business hours, 8:30 AM to 5:00 PM Monday through Friday, call [phone].  After hours and on weekends, please call the GI answering service at [phone] who will take a message and have the physician on call contact you.   Following lower endoscopy (colonoscopy or flexible sigmoidoscopy):  Excessive amounts of blood in the stool  Significant tenderness or worsening of abdominal pains  Swelling of the abdomen that is new, acute  Fever of 100F or higher  Following upper endoscopy (EGD)  Vomiting of blood or coffee ground material  New chest pain or pain under the shoulder blades  Painful or persistently difficult swallowing  New shortness of breath  Fever of 100F or higher  Black, tarry-looking stools  FOLLOW UP: If any biopsies were taken you will be contacted by phone or by letter within the next 1-3 weeks.  Call your gastroenterologist if you have not heard about the biopsies in 3 weeks.  Our staff will call the home number listed on your records the next business day following your procedure to check on you and address any questions or concerns that you may have at that time regarding the information given to you following your procedure. This is a  courtesy call and so if there is no answer at the home number and we have not heard from you through the emergency physician on call, we will assume that you have returned to your regular daily activities without incident.  SIGNATURES/CONFIDENTIALITY: You and/or your care partner have signed paperwork which will be entered into your electronic medical record.  These signatures attest to the fact that that the information above on your After Visit Summary has been reviewed and is understood.  Full responsibility of the  confidentiality of this discharge information lies with you and/or your care-partner.   Please follow all discharge instructions given to you by the recovery room nurse. If you have any questions or problems after discharge please call one of the numbers listed above. You will receive a phone call in the am to see how you are doing and answer any questions you may have. Thank you for choosing [facility] for your health care needs.

## 2012-02-23 ENCOUNTER — Telehealth: Payer: Self-pay | Admitting: *Deleted

## 2012-02-23 NOTE — Telephone Encounter (Signed)
  Follow up Call-  Call back number 02/22/2012  Post procedure Call Back phone  # [phone] or cell [phone]  Permission to leave phone message Yes     Patient questions:  Do you have a fever, pain , or abdominal swelling? no Pain Score  0 *  Have you tolerated food without any problems? yes  Have you been able to return to your normal activities? yes  Do you have any questions about your discharge instructions: Diet   no Medications  no Follow up visit  no  Do you have questions or concerns about your Care? no  Actions: * If pain score is 4 or above: No action needed, pain <4.

## 2012-03-01 ENCOUNTER — Encounter: Payer: Self-pay | Admitting: Internal Medicine

## 2012-03-01 DIAGNOSIS — Q250 Patent ductus arteriosus: Secondary | ICD-10-CM | POA: Insufficient documentation

## 2012-05-02 ENCOUNTER — Ambulatory Visit: Payer: Medicare Other | Admitting: Internal Medicine

## 2012-05-29 ENCOUNTER — Ambulatory Visit (INDEPENDENT_AMBULATORY_CARE_PROVIDER_SITE_OTHER): Payer: Medicare Other | Admitting: Internal Medicine

## 2012-05-29 ENCOUNTER — Encounter: Payer: Self-pay | Admitting: Internal Medicine

## 2012-05-29 VITALS — BP 90/54 | HR 72 | Ht 69.5 in | Wt 176.1 lb

## 2012-05-29 DIAGNOSIS — K5010 Crohn's disease of large intestine without complications: Secondary | ICD-10-CM

## 2012-05-29 DIAGNOSIS — K56609 Unspecified intestinal obstruction, unspecified as to partial versus complete obstruction: Secondary | ICD-10-CM

## 2012-05-29 DIAGNOSIS — K56699 Other intestinal obstruction unspecified as to partial versus complete obstruction: Secondary | ICD-10-CM

## 2012-05-29 NOTE — Patient Instructions (Addendum)
Glad you are doing ok.  I will contact Dr. Redmond Pulling about your treatment and let you know what I find out within a few weeks.  Thank you for choosing me and [facility].  Gatha Mayer, MD, [facility]

## 2012-05-29 NOTE — Assessment & Plan Note (Signed)
asymptomatic

## 2012-05-29 NOTE — Progress Notes (Addendum)
Patient ID: Jonathon Vazquez, male   DOB: 1971/09/03, 41 y.o.   MRN: [id number]  41 yo AA man here with wife. He has crohn's disease and colonoscopy in spring showed a left colon stricture that appears inflammatory and biopsies confirmed this.  He is doing well overall without pain, diarrhea or constipation. No rectal bleeding.  Medications, allergies, past medical history, past surgical history, family history and social history are reviewed and updated in the EMR.   Ass/Plan:  He appears to be doing well.  1. Continue Avonex for MS, I suspect it is helping his Crohn's 2. Will investigate if there is other therapy needed at this time.   07/17/2012  Will ask him to add mesalamine to his regiein.

## 2012-06-05 DIAGNOSIS — M940 Chondrocostal junction syndrome [Tietze]: Secondary | ICD-10-CM | POA: Diagnosis not present

## 2012-07-18 ENCOUNTER — Telehealth: Payer: Self-pay

## 2012-07-18 MED ORDER — MESALAMINE 1.2 G PO TBEC: 2400.0000 mg | DELAYED_RELEASE_TABLET | Freq: Every day | ORAL | Status: DC

## 2012-07-18 NOTE — Telephone Encounter (Signed)
Patient advised.  Recall entered.  I have provided him samples of Lialda Samples of this drug were given to the patient, quantity 2 boxes Lot Number   [id number], exp 11/2013

## 2012-07-18 NOTE — Telephone Encounter (Signed)
Message copied by Marlon Pel on Wed Jul 18, 2012  9:36 AM ------      Message from: Gatha Mayer      Created: Tue Jul 17, 2012  1:25 PM      Regarding: Crohn's tx       Let him know I was unable to get in touch with Dr. Redmond Pulling at [facility] - they are tied up with Epic implementation and I know she is busy            I recommend he take Lialda 2.4 grams daily to try to help Crohn's more            #60 with 11 refills            Office visit recall 10 months

## 2012-10-05 DIAGNOSIS — Z23 Encounter for immunization: Secondary | ICD-10-CM | POA: Diagnosis not present

## 2013-01-03 DIAGNOSIS — H40019 Open angle with borderline findings, low risk, unspecified eye: Secondary | ICD-10-CM | POA: Diagnosis not present

## 2013-01-03 DIAGNOSIS — H11159 Pinguecula, unspecified eye: Secondary | ICD-10-CM | POA: Diagnosis not present

## 2013-01-03 DIAGNOSIS — H04129 Dry eye syndrome of unspecified lacrimal gland: Secondary | ICD-10-CM | POA: Diagnosis not present

## 2013-01-10 ENCOUNTER — Other Ambulatory Visit: Payer: Self-pay | Admitting: Neurology

## 2013-03-22 ENCOUNTER — Ambulatory Visit (INDEPENDENT_AMBULATORY_CARE_PROVIDER_SITE_OTHER): Payer: Medicare Other | Admitting: Neurology

## 2013-03-22 ENCOUNTER — Encounter: Payer: Self-pay | Admitting: Neurology

## 2013-03-22 VITALS — BP 93/60 | HR 62 | Ht 71.0 in | Wt 172.0 lb

## 2013-03-22 DIAGNOSIS — G35 Multiple sclerosis: Secondary | ICD-10-CM

## 2013-03-22 NOTE — Progress Notes (Signed)
History of Present Illness  HPI: Jonathon Vazquez, 42year-old black male returns today for followup of RRMS.  He was seen by his GNA since 2000, initially presenting with facial numbness, MRI demonstrates lesions consistent with MS, however he has lost followups, was suggested Copaxone in 2004, but never followed through, he was reevaluated by Dr. Ron Vazquez in February of 2008, he began to have bladder incontinence, dizziness, blurry vision, unbalanced gait, repeat MRI at that time continued to demonstrate multiple foci of signal hyperintensity, involving brainstem, superior peduncle, deep white matters, there was pericallosal involvement, no enhancement, but there was positive DWIs lesion, he was started on Avenox since 06/13/2007,  he continued to have flulike illness following injection,  He also reported a history of Crohn's disease, last treatment was in 2004 with Ascol, he no longer has significant GI symptoms, not on any immunosuppressive medications.  He was treated with immunosuppressive treatment since 1992.  He had one to 2 flareups each year,  presenting with increased dizziness, unbalanced gait, blurry vision, worsening memory trouble, often partial improvement with steroid package.  Most recent MRI cervical was in July 2011, remote demyelinating plaque at C2 and C4, no enhancing lesions.  Repeat MRI brain and cervical in Jan 2013 showed a defined hyperintensity in the spinal cord at C2 which likely represents remote age inactive demyelinating plaque. No enhancing lesions are noted. As compared with previous MRI dated 04/30/2010 the C4 spinal cord hyperintensity is no longer seen.  MRI scan of the brain showed multiple brainstem, cerebellar, corpus callosal and periventricular white matter hyperintensities consistent with multiple sclerosis. No enhancing lesions are noted. Presence of T1 black holes  andd corpus callosal atrophy indicate chronic disease. Overall no significant changes compared with  MRI from 01/23/2007   UPDATE May 30th 2014: He is doing very well, there was no new neurological symptoms, he is taking Avonex, mild flulike illness right out of the injection, improved by Tylenol, or aleve.  She complains of frequent bilateral frontal headaches,  Review of Systems  Out of a complete 14 system review, the patient complains of only the following symptoms, and all other reviewed systems are negative.   Constitutional:   fatigue Cardiovascular:  N/A Ear/Nose/Throat:  N/A Skin: N/A Eyes: N/A Respiratory: N/A Gastroitestinal: N/A    Hematology/Lymphatic:  N/A Endocrine:  N/A Musculoskeletal: aching muscles Allergy/Immunology: N/A Neurological: memory loss, headache Psychiatric:    Decreased energy   Physical Exam  General: well developed, well  nourished, seated, in no evident distress Head: head  normocephalic and atraumatic.  Oropharynx benign. Neck: supple no carotid bruits Respiratory: clear to auscultation bilaterally Cardiovascular: regular rate rhythm  Neurologic Exam  Mental Status: pleasant, awake, alert, cooperative to history, talking, and casual conversation. Cranial Nerves: CN II-XII pupils were equal round reactive to light.  Fundi were sharp bilaterally.  Extraocular movements were full.  Visual fields were full on confrontational test.  Facial sensation and strength were normal.  Hearing was intact to finger rubbing bilaterally.  Uvula tongue were midline.  Head turning and shoulder shrugging were normal and symmetric.  Tongue protrusion into the cheeks strength were normal.  Motor: mild lower extremity spasticity, no weakness. Sensory: Normal to light touch, pinprick, proprioception, and vibratory sensation. Coordination: mild finger to nose and heel to shin dysmetria. Gait and Station: wide based and unsteady, mild difficulty to perform tiptoe, heel, and moderate difficulty with tandem walking.  Romberg sign: positive. mild trunk ataxia Reflexes: Deep  tendon reflexes: Biceps: 2/2, Brachioradialis: 2/2, Triceps:  2/2, Pateller: 2+/2+, Achilles: 1/1.  Plantar responses are flexor.   Assessment and Plan:   42 years old right handed [place] American male, with history of relapsing remitting multiple sclerosis, continued symptoms of unsteady gait,dizziness, memory loss, he has been on Avonex since August 2008, flulike illness with Avonex injection.  1.Repeat MRI of the brain, and cervical continued showed evidence of demyelinating disease. 2.  Because of his previous history of Crohn;s disease  and immunosuppresive treatment, will keep him on Avonex now, only switch to different medication if he has more clinical relapses, and worsening imaging findings.

## 2013-04-02 ENCOUNTER — Telehealth: Payer: Self-pay | Admitting: Neurology

## 2013-04-02 NOTE — Telephone Encounter (Signed)
I called pt and LMVM for her that Dr. Krista Blue wanted to proceed with MRI's.  He is to call back if questions.

## 2013-04-08 ENCOUNTER — Encounter: Payer: Self-pay | Admitting: Internal Medicine

## 2013-04-12 ENCOUNTER — Other Ambulatory Visit: Payer: Self-pay

## 2013-04-12 MED ORDER — INTERFERON BETA-1A 30 MCG/0.5ML IM KIT: 30.0000 ug | PACK | INTRAMUSCULAR | Status: DC

## 2013-04-17 ENCOUNTER — Telehealth: Payer: Self-pay | Admitting: Neurology

## 2013-04-17 MED ORDER — INTERFERON BETA-1A 30 MCG/0.5ML IM KIT: 30.0000 ug | PACK | INTRAMUSCULAR | Status: DC

## 2013-04-17 NOTE — Telephone Encounter (Signed)
Rx Sent  

## 2013-04-19 ENCOUNTER — Encounter: Payer: Self-pay | Admitting: Family Medicine

## 2013-04-19 ENCOUNTER — Ambulatory Visit (INDEPENDENT_AMBULATORY_CARE_PROVIDER_SITE_OTHER): Payer: Medicare Other | Admitting: Family Medicine

## 2013-04-19 VITALS — BP 90/60 | HR 72 | Temp 98.2°F | Resp 14 | Ht 71.0 in | Wt 162.0 lb

## 2013-04-19 DIAGNOSIS — L259 Unspecified contact dermatitis, unspecified cause: Secondary | ICD-10-CM

## 2013-04-19 MED ORDER — PREDNISONE 20 MG PO TABS: ORAL_TABLET | ORAL | Status: DC

## 2013-04-19 NOTE — Progress Notes (Signed)
  Subjective:    Patient ID: Jonathon Vazquez, male    DOB: 1970/11/05, 42 y.o.   MRN: [id number]  HPI Very pleasant gentleman he comes in with a one-week history of hives and erythema on his left volar forearm.  They're linear excoriations within the erythema, there also linear groupings of papules.  Is intensely pruritic and is not going away. He was recently working in the yard and rubbed a rag that had been laying in the field over that part of his skin. Past Medical History  Diagnosis Date  . Crohn's disease   . Patent ductus arteriosus   . Atrial fibrillation   . Multiple sclerosis   . Colon stricture     descending colon  . Anal fistula 1996  . Anal stenosis   . Regional enteritis of large intestine    Current Outpatient Prescriptions on File Prior to Visit  Medication Sig Dispense Refill  . acetaminophen (TYLENOL) 325 MG tablet Take 650 mg by mouth every 6 (six) hours as needed.      . interferon beta-1a (AVONEX PEN) 30 MCG/0.5ML injection Inject 0.5 mLs (30 mcg total) into the muscle every 7 (seven) days.  1 kit  11   No current facility-administered medications on file prior to visit.   No Known Allergies History   Social History  . Marital Status: Married    Spouse Name: [place]    Number of Children: 3  . Years of Education: 13   Occupational History  . disabled    Social History Main Topics  . Smoking status: Former Research scientist (life sciences)  . Smokeless tobacco: Never Used  . Alcohol Use: No  . Drug Use: No  . Sexually Active: Not on file   Other Topics Concern  . Not on file   Social History Narrative   Married, 1 son one daughter. 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 Multimedia programmer)   Disabled, previously worked at [employer], disability due to multiple sclerosis   Caffeine one daily   Left handed.      Review of Systems  All other systems reviewed and are negative.       Objective:   Physical Exam  Vitals reviewed. Cardiovascular: Normal rate and regular rhythm.   Pulmonary/Chest:  Effort normal and breath sounds normal.  Skin: Rash noted. There is erythema.   see the description in the history of present illness.        Assessment & Plan:  1. Contact dermatitis I suspect rhus dermatitis. Recheck in one week if no better sooner if worse. - predniSONE (DELTASONE) 20 MG tablet; 3 tabs poqday 1-2, 2 tabs poqday 3-4, 1 tab poqday 5-6  Dispense: 12 tablet; Refill: 0

## 2013-04-22 ENCOUNTER — Ambulatory Visit
Admission: RE | Admit: 2013-04-22 | Discharge: 2013-04-22 | Disposition: A | Discharge status: Home / Self Care | Payer: Medicare Other | Source: Ambulatory Visit | Attending: Neurology | Admitting: Neurology

## 2013-04-22 DIAGNOSIS — G35 Multiple sclerosis: Secondary | ICD-10-CM

## 2013-04-22 MED ORDER — GADOBENATE DIMEGLUMINE 529 MG/ML IV SOLN
15.0000 mL | Freq: Once | INTRAVENOUS | Status: AC | PRN
  Administered 2013-04-22: 15 mL via INTRAVENOUS

## 2013-04-23 ENCOUNTER — Telehealth: Payer: Self-pay | Admitting: Neurology

## 2013-04-23 NOTE — Telephone Encounter (Signed)
Please call patient, MRI brain showed progression of his MS,  I need to discuss with him treatment options, give him a followup appt in 1-3 weeks.

## 2013-04-24 NOTE — Telephone Encounter (Signed)
I spoke to patient and relayed results of MRI brain showing progression of MS, per Dr. Krista Blue.  I will have Jonathon Vazquez call him for a follow up appointment to discuss treatment options.  Best number to reach him is [phone].

## 2013-04-30 ENCOUNTER — Ambulatory Visit (INDEPENDENT_AMBULATORY_CARE_PROVIDER_SITE_OTHER): Payer: Medicare Other | Admitting: Neurology

## 2013-04-30 ENCOUNTER — Encounter: Payer: Self-pay | Admitting: Neurology

## 2013-04-30 VITALS — BP 112/65 | HR 69 | Ht 69.0 in | Wt 169.5 lb

## 2013-04-30 DIAGNOSIS — K5010 Crohn's disease of large intestine without complications: Secondary | ICD-10-CM | POA: Diagnosis not present

## 2013-04-30 DIAGNOSIS — K56609 Unspecified intestinal obstruction, unspecified as to partial versus complete obstruction: Secondary | ICD-10-CM | POA: Diagnosis not present

## 2013-04-30 DIAGNOSIS — G35 Multiple sclerosis: Secondary | ICD-10-CM

## 2013-04-30 DIAGNOSIS — K56699 Other intestinal obstruction unspecified as to partial versus complete obstruction: Secondary | ICD-10-CM

## 2013-04-30 DIAGNOSIS — Q250 Patent ductus arteriosus: Secondary | ICD-10-CM

## 2013-04-30 NOTE — Progress Notes (Signed)
History of Present Illness:   Jonathon Vazquez, 42 year-old black male returns today for followup of RRMS.  He was seen by his GNA since 2000, initially presenting with facial numbness, MRI demonstrates lesions consistent with MS, however he has lost followups, was suggested Copaxone in 2004, but never followed through, he was reevaluated by Dr. Ron Parker in February of 2008, he began to have bladder incontinence, dizziness, blurry vision, unbalanced gait, repeat MRI at that time continued to demonstrate multiple foci of signal hyperintensity, involving brainstem, superior peduncle, deep white matters, there was pericallosal involvement, no enhancement, but there was positive DWIs lesion, he was started on Avenox since 06/13/2007,  he continued to have flulike illness following injection,  He also reported a history of Crohn's disease, last treatment was in 2004 with Ascol, he no longer has significant GI symptoms, not on any immunosuppressive medications.  He was treated with immunosuppressive treatment since 1992.  He had one to 2 flareups each year,  presenting with increased dizziness, unbalanced gait, blurry vision, worsening memory trouble, often partial improvement with steroid package.  Most recent MRI cervical was in July 2011, remote demyelinating plaque at C2 and C4, no enhancing lesions.  Repeat MRI brain and cervical in Jan 2013 showed a defined hyperintensity in the spinal cord at C2 which likely represents remote age inactive demyelinating plaque. No enhancing lesions are noted. As compared with previous MRI dated 04/30/2010 the C4 spinal cord hyperintensity is no longer seen.  MRI scan of the brain showed multiple brainstem, cerebellar, corpus callosal and periventricular white matter hyperintensities consistent with multiple sclerosis. No enhancing lesions are noted. Presence of T1 black holes  andd corpus callosal atrophy indicate chronic disease. Overall no significant changes compared with MRI  from 01/23/2007   UPDATE May 30th 2014: He is doing very well, there was no new neurological symptoms, he is taking Avonex, mild flulike illness right out of the injection, improved by Tylenol, or aleve.  He complains of frequent bilateral frontal headaches.  UPDATE July 9th 2014: He came in early to review MRI of the brain, which has demonstrated multiple periventricular and subcortical and infratentorial chronic demyelinating plaques. No acute plaques. Compared to prior MRI on 01/23/07, there are has been disease progression.  He continues to do very well, reported prior to MRI scan, he has been off of his Avonex for 4 months, due to insurance reasons, after discussed with Jonathon Vazquez we decided to stay on current Avonex treatment, overall only slight change in his MRI scan there is no significant clinical worsening, we will repeat MRI of the brain and cervical spine with and without contrast in one year  Review of system:Out of a complete 14 system review, the patient complains of only the following symptoms, and all other reviewed systems are negative.   Constitutional:   fatigue Cardiovascular:  N/A Ear/Nose/Throat:  N/A Skin: N/A Eyes: N/A Respiratory: N/A Gastroitestinal: N/A    Hematology/Lymphatic:  N/A Endocrine:  N/A Musculoskeletal: aching muscles Allergy/Immunology: N/A Neurological: memory loss, headache Psychiatric:    Decreased energy   Physical Exam  General: well developed, well  nourished, seated, in no evident distress Head: head  normocephalic and atraumatic.  Oropharynx benign. Neck: supple no carotid bruits Respiratory: clear to auscultation bilaterally Cardiovascular: regular rate rhythm  Neurologic Exam  Mental Status: pleasant, awake, alert, cooperative to history, talking, and casual conversation. Cranial Nerves: CN II-XII pupils were equal round reactive to light.  Fundi were sharp bilaterally.  Extraocular movements were full.  Visual fields were full on  confrontational test.  Facial sensation and strength were normal.  Hearing was intact to finger rubbing bilaterally.  Uvula tongue were midline.  Head turning and shoulder shrugging were normal and symmetric.  Tongue protrusion into the cheeks strength were normal.  Motor: mild lower extremity spasticity, no weakness. Sensory: Normal to light touch, pinprick, proprioception, and vibratory sensation. Coordination: mild finger to nose and heel to shin dysmetria. Gait and Station: wide based and unsteady, mild difficulty to perform tiptoe, heel, and moderate difficulty with tandem walking.  Romberg sign: positive. mild trunk ataxia Reflexes: Deep tendon reflexes: Biceps: 2/2, Brachioradialis: 2/2, Triceps: 2/2, Pateller: 2+/2+, Achilles: 1/1.  Plantar responses are flexor.   Assessment and Plan: 42 years old right handed [place] American male, with history of relapsing remitting multiple sclerosis, continued symptoms of unsteady gait,dizziness, memory loss, he has been on Avonex since August 2008, flulike illness with Avonex injection.  1. Repeat MRI of the brain, and cervical in August 2015. 2. Continue Avenox now 3. RTC in on year

## 2013-05-23 ENCOUNTER — Other Ambulatory Visit (INDEPENDENT_AMBULATORY_CARE_PROVIDER_SITE_OTHER): Payer: Medicare Other

## 2013-05-23 ENCOUNTER — Ambulatory Visit (INDEPENDENT_AMBULATORY_CARE_PROVIDER_SITE_OTHER): Payer: Medicare Other | Admitting: Internal Medicine

## 2013-05-23 ENCOUNTER — Encounter: Payer: Self-pay | Admitting: Internal Medicine

## 2013-05-23 VITALS — BP 94/60 | HR 72 | Ht 69.5 in | Wt 166.2 lb

## 2013-05-23 DIAGNOSIS — K5010 Crohn's disease of large intestine without complications: Secondary | ICD-10-CM

## 2013-05-23 DIAGNOSIS — K56609 Unspecified intestinal obstruction, unspecified as to partial versus complete obstruction: Secondary | ICD-10-CM | POA: Diagnosis not present

## 2013-05-23 DIAGNOSIS — K50112 Crohn's disease of large intestine with intestinal obstruction: Secondary | ICD-10-CM

## 2013-05-23 DIAGNOSIS — Z92241 Personal history of systemic steroid therapy: Secondary | ICD-10-CM | POA: Diagnosis not present

## 2013-05-23 DIAGNOSIS — E559 Vitamin D deficiency, unspecified: Secondary | ICD-10-CM

## 2013-05-23 LAB — COMPREHENSIVE METABOLIC PANEL
ALT: 8 U/L (ref 0–53)
Albumin: 3.6 g/dL (ref 3.5–5.2)
CO2: 29 mEq/L (ref 19–32)
Calcium: 9.2 mg/dL (ref 8.4–10.5)
Chloride: 102 mEq/L (ref 96–112)
Creatinine, Ser: 1 mg/dL (ref 0.4–1.5)
GFR: 103.46 mL/min (ref >60.00)
Total Protein: 7.3 g/dL (ref 6.0–8.3)

## 2013-05-23 LAB — CBC WITH DIFFERENTIAL/PLATELET
Basophils Absolute: 0 10*3/uL (ref 0.0–0.1)
Hemoglobin: 13.2 g/dL (ref 13.0–17.0)
Lymphocytes Relative: 25.5 % (ref 12.0–46.0)
Monocytes Relative: 10.3 % (ref 3.0–12.0)
Neutro Abs: 2.9 10*3/uL (ref 1.4–7.7)
RBC: 4.74 Mil/uL (ref 4.22–5.81)
RDW: 13.6 % (ref 11.5–14.6)

## 2013-05-23 MED ORDER — SOD PICOSULFATE-MAG OX-CIT ACD 10-3.5-12 MG-GM-GM PO PACK: 1.0000 | PACK | Freq: Once | ORAL | Status: DC

## 2013-05-23 NOTE — Patient Instructions (Addendum)
Your physician has requested that you go to the basement for the following lab work before leaving today: CBC/diff, CMET, C-reactive protein, Vitamin D level  You have been scheduled for a colonoscopy with propofol. Please follow written instructions given to you at your visit today.  Please use the prep kit you have been given today. If you use inhalers (even only as needed), please bring them with you on the day of your procedure. Your physician has requested that you go to www.[email or web address].com and enter the access code given to you at your visit today. This web site gives a general overview about your procedure. However, you should still follow specific instructions given to you by our office regarding your preparation for the procedure.   Please bring Jonathon Vazquez a copy of your vaccine records for our files.    I appreciate the opportunity to care for you.

## 2013-05-23 NOTE — Progress Notes (Signed)
  Subjective:    Patient ID: Jonathon Vazquez, male    DOB: 02/04/1971, 42 y.o.   MRN: [id number]  HPI The patient is here with his wife. He has Crohn's colitis and stricture - has been on IFN (Avonex) for MS and that has been treating his Crohn's - not on typical Crohn's Tx for some time. Earlier in Spring/Summer was unable to obtain IFN due to insurance issues - he has had some new brain lesions but no neuro sxs. In the past month or so he developed narrowing of stool caliber, increased LLQ pain and had some vomiting. This is improving.  Similar to prior flares  Medications, allergies, past medical history, past surgical history, family history and social history are reviewed and updated in the EMR.  Review of Systems Recent contact dermatitis    Objective:   Physical Exam General:  NAD Eyes:   anicteric Lungs:  clear Heart:  S1S2 no rubs, murmurs or gallops Abdomen:  soft and mildly tender LLQ, BS+ Ext:   no edema  Data Reviewed:  Prior colonoscopy and path Neuro note 05/01/2013    Assessment & Plan:  CC (Crohn's colitis), with stricture  Time to reassess with colonoscopy given chronic colitis and stricture with increased Ca risk May have had a flare since he was w/o IFN for 2-3 months - seems to be improving Will use gastroscope to perform colonoscopy  Labs today - CBC, CMET, CRP He will bring prior immunization records  Unspecified vitamin D deficiency  Recheck level - has been deficient in past  Personal history of systemic steroid therapy  DEXA scan due to osteoporosis risk - we tried but not covered by [facility] - will investigate why not as he has had significant hx prednisone use in past

## 2013-05-24 NOTE — Progress Notes (Signed)
Quick Note:  Labs ok - inform patient please ______

## 2013-08-01 ENCOUNTER — Encounter: Payer: Self-pay | Admitting: Internal Medicine

## 2013-08-01 ENCOUNTER — Ambulatory Visit (AMBULATORY_SURGERY_CENTER): Payer: Medicare Other | Admitting: Internal Medicine

## 2013-08-01 VITALS — BP 112/70 | HR 60 | Temp 97.7°F | Resp 0 | Ht 69.5 in | Wt 166.0 lb

## 2013-08-01 DIAGNOSIS — K5010 Crohn's disease of large intestine without complications: Secondary | ICD-10-CM

## 2013-08-01 DIAGNOSIS — K56609 Unspecified intestinal obstruction, unspecified as to partial versus complete obstruction: Secondary | ICD-10-CM | POA: Diagnosis not present

## 2013-08-01 DIAGNOSIS — K5289 Other specified noninfective gastroenteritis and colitis: Secondary | ICD-10-CM

## 2013-08-01 DIAGNOSIS — K6389 Other specified diseases of intestine: Secondary | ICD-10-CM

## 2013-08-01 DIAGNOSIS — Z1211 Encounter for screening for malignant neoplasm of colon: Secondary | ICD-10-CM | POA: Diagnosis not present

## 2013-08-01 DIAGNOSIS — K5090 Crohn's disease, unspecified, without complications: Secondary | ICD-10-CM | POA: Diagnosis not present

## 2013-08-01 DIAGNOSIS — K56699 Other intestinal obstruction unspecified as to partial versus complete obstruction: Secondary | ICD-10-CM

## 2013-08-01 DIAGNOSIS — I4891 Unspecified atrial fibrillation: Secondary | ICD-10-CM | POA: Diagnosis not present

## 2013-08-01 MED ORDER — SODIUM CHLORIDE 0.9 % IV SOLN: 500.0000 mL | INTRAVENOUS | Status: DC

## 2013-08-01 NOTE — Op Note (Addendum)
[place]  [address] [place], [address]   COLONOSCOPY PROCEDURE REPORT  PATIENT: Jonathon, Vazquez  MR#: [id number] BIRTHDATE: 12/01/1970 , 42  yrs. old GENDER: Male ENDOSCOPIST: Gatha Mayer, MD, [facility] PROCEDURE DATE:  08/01/2013 PROCEDURE:   Colonoscopy with biopsy First Screening Colonoscopy - Avg.  risk and is 50 yrs.  old or older - No.  Prior Negative Screening - Now for repeat screening. N/A  History of Adenoma - Now for follow-up colonoscopy & has been > or = to 3 yrs.  N/A ASA CLASS:   Class II INDICATIONS:High risk patient with previously diagnosed Crohn's disease: large intestine.   Stricture MEDICATIONS: propofol (Diprivan) 234m IV, MAC sedation, administered by CRNA, and These medications were titrated to patient response per physician's verbal order  DESCRIPTION OF PROCEDURE:   After the risks benefits and alternatives of the procedure were thoroughly explained, informed consent was obtained.  A digital rectal exam revealed no abnormalities of the rectum, A digital rectal exam revealed no prostatic nodules, and A digital rectal exam revealed the prostate was not enlarged.   The [id number] endoscope was introduced through the anus and advanced to the terminal ileum which was intubated for a short distance. No adverse events experienced. The quality of the prep was Prepopik excellent  The instrument was then slowly withdrawn as the colon was fully examined.   COLON FINDINGS: Traversable stricture with ulceration and inflammation but somewjhat fixed,  measuring 10 mm in length was found The finding was in the left colon 30-40 cm.  Multiple biopsies were performed using cold forceps.   Mild ileitis was found in the terminal ileum and at the ileocecal valve. Small ulcers, a few. Multiple biopsies were performed using cold forceps. A scar from prior stricure was noted at the splenic flexure. The colon mucosa was otherwise normal.   Retroflexed views revealed no abnormalities. The time to cecum=1 minutes 32 seconds. Withdrawal time=12 minutes 13 seconds.  The scope was withdrawn and the procedure completed. COMPLICATIONS: There were no complications.  ENDOSCOPIC IMPRESSION: 1.   Inflammatory stricture measuring 10 mm in length in the left colon 30-40 cm; multiple biopsies were performed using cold forceps  2.   Mild erosion and ileitis was found in the terminal ileum and at the ileocecal valve; multiple biopsies were performed using cold forceps 3.   Post-polypectomy scar at the splenic flexure 4.   The colon mucosa was otherwise normal - gastroscope used to perform procedure  RECOMMENDATIONS: Office will call with the results/recommendations. Current therapy not controlling disease. eSigned:  CGatha Mayer MD, [facility]10/06/2013 11:06 AMRevised: 08/01/2013 11:06 AM  cc: The Patient

## 2013-08-01 NOTE — Progress Notes (Signed)
Called to room to assist during endoscopic procedure.  Patient ID and intended procedure confirmed with present staff. Received instructions for my participation in the procedure from the performing physician.  

## 2013-08-01 NOTE — Progress Notes (Signed)
No complaints noted in the recovery room. Maw

## 2013-08-01 NOTE — Progress Notes (Signed)
Lidocaine-40mg IV prior to Propofol InductionPropofol given over incremental dosages 

## 2013-08-01 NOTE — Patient Instructions (Addendum)
You still have a stricture and it is  inflamed. There was mild inflammation in the ileum (small intestine). Biopsies were taken and I will call you about it.  Your current treatment is not working as well as it was.  I appreciate the opportunity to care for you. Gatha Mayer, MD, [facility]     You may resume your current medications today. A handout was given to you care partner on crohn's. Please call if any questions or concerns    YOU HAD AN ENDOSCOPIC PROCEDURE TODAY AT [place]: Refer to the procedure report that was given to you for any specific questions about what was found during the examination.  If the procedure report does not answer your questions, please call your gastroenterologist to clarify.  If you requested that your care partner not be given the details of your procedure findings, then the procedure report has been included in a sealed envelope for you to review at your convenience later.  YOU SHOULD EXPECT: Some feelings of bloating in the abdomen. Passage of more gas than usual.  Walking can help get rid of the air that was put into your GI tract during the procedure and reduce the bloating. If you had a lower endoscopy (such as a colonoscopy or flexible sigmoidoscopy) you may notice spotting of blood in your stool or on the toilet paper. If you underwent a bowel prep for your procedure, then you may not have a normal bowel movement for a few days.  DIET: Your first meal following the procedure should be a light meal and then it is ok to progress to your normal diet.  A half-sandwich or bowl of soup is an example of a good first meal.  Heavy or fried foods are harder to digest and may make you feel nauseous or bloated.  Likewise meals heavy in dairy and vegetables can cause extra gas to form and this can also increase the bloating.  Drink plenty of fluids but you should avoid alcoholic beverages for 24 hours.  ACTIVITY: Your care partner should take you  home directly after the procedure.  You should plan to take it easy, moving slowly for the rest of the day.  You can resume normal activity the day after the procedure however you should NOT DRIVE or use heavy machinery for 24 hours (because of the sedation medicines used during the test).    SYMPTOMS TO REPORT IMMEDIATELY: A gastroenterologist can be reached at any hour.  During normal business hours, 8:30 AM to 5:00 PM Monday through Friday, call [phone].  After hours and on weekends, please call the GI answering service at [phone] who will take a message and have the physician on call contact you.   Following lower endoscopy (colonoscopy or flexible sigmoidoscopy):  Excessive amounts of blood in the stool  Significant tenderness or worsening of abdominal pains  Swelling of the abdomen that is new, acute  Fever of 100F or higher   FOLLOW UP: If any biopsies were taken you will be contacted by phone or by letter within the next 1-3 weeks.  Call your gastroenterologist if you have not heard about the biopsies in 3 weeks.  Our staff will call the home number listed on your records the next business day following your procedure to check on you and address any questions or concerns that you may have at that time regarding the information given to you following your procedure. This is a courtesy call and  so if there is no answer at the home number and we have not heard from you through the emergency physician on call, we will assume that you have returned to your regular daily activities without incident.  SIGNATURES/CONFIDENTIALITY: You and/or your care partner have signed paperwork which will be entered into your electronic medical record.  These signatures attest to the fact that that the information above on your After Visit Summary has been reviewed and is understood.  Full responsibility of the confidentiality of this discharge information lies with you and/or your care-partner.

## 2013-08-02 ENCOUNTER — Telehealth: Payer: Self-pay | Admitting: *Deleted

## 2013-08-02 NOTE — Telephone Encounter (Signed)
  Follow up Call-  Call back number 08/01/2013 02/22/2012  Post procedure Call Back phone  # [phone] or cell [phone]  Permission to leave phone message Yes Yes     Patient questions:  Do you have a fever, pain , or abdominal swelling? no Pain Score  0 *  Have you tolerated food without any problems? yes  Have you been able to return to your normal activities? yes  Do you have any questions about your discharge instructions: Diet   no Medications  no Follow up visit  no  Do you have questions or concerns about your Care? no  Actions: * If pain score is 4 or above: No action needed, pain <4.

## 2013-08-12 NOTE — Progress Notes (Signed)
Quick Note:  Please let him know biopsies show inflammation - not pre-cancerous changes or cancer  I am looking into other Tx and will call  1 year colon recall from [facility] - no letter ______

## 2013-08-27 DIAGNOSIS — Z23 Encounter for immunization: Secondary | ICD-10-CM | POA: Diagnosis not present

## 2013-09-02 ENCOUNTER — Telehealth: Payer: Self-pay | Admitting: Neurology

## 2013-09-02 NOTE — Telephone Encounter (Signed)
Dana:  Please give him a follow up visit on next available.

## 2013-09-02 NOTE — Progress Notes (Signed)
Quick Note:  Let him know Dr. Krista Blue (neuro) and I are discussing Tx options that can help MS and Crohn's ______

## 2013-09-03 NOTE — Telephone Encounter (Signed)
Called Patient and spoke with him he is coming in to see Dr.Yan 09-09-2013

## 2013-09-09 ENCOUNTER — Ambulatory Visit (INDEPENDENT_AMBULATORY_CARE_PROVIDER_SITE_OTHER): Payer: Medicare Other | Admitting: Neurology

## 2013-09-09 ENCOUNTER — Encounter: Payer: Self-pay | Admitting: Neurology

## 2013-09-09 VITALS — BP 109/60 | HR 61 | Ht 69.0 in | Wt 165.0 lb

## 2013-09-09 DIAGNOSIS — G35 Multiple sclerosis: Secondary | ICD-10-CM

## 2013-09-09 DIAGNOSIS — K5010 Crohn's disease of large intestine without complications: Secondary | ICD-10-CM

## 2013-09-09 DIAGNOSIS — K56699 Other intestinal obstruction unspecified as to partial versus complete obstruction: Secondary | ICD-10-CM

## 2013-09-09 DIAGNOSIS — K56609 Unspecified intestinal obstruction, unspecified as to partial versus complete obstruction: Secondary | ICD-10-CM | POA: Diagnosis not present

## 2013-09-09 NOTE — Progress Notes (Addendum)
History of Present Illness:   Jonathon Vazquez, 42 year-old black male returns today for followup of RRMS.  He was seen by his GNA since 2000, initially presenting with facial numbness, MRI demonstrates lesions consistent with MS, however he has lost followups, was suggested Copaxone in 2004, but never followed through, he was reevaluated by Dr. Ron Parker in February of 2008, he began to have bladder incontinence, dizziness, blurry vision, unbalanced gait, repeat MRI at that time continued to demonstrate multiple foci of signal hyperintensity, involving brainstem, superior peduncle, deep white matters, there was pericallosal involvement, no enhancement, but there was positive DWIs lesion, he was started on Avenox since 06/13/2007,  he continued to have flulike illness following injection,  He also reported a history of Crohn's disease, last treatment was in 2004 with Ascol, he no longer has significant GI symptoms, not on any immunosuppressive medications.  He was treated with immunosuppressive treatment since 1992.  He had one to 2 flareups each year,  presenting with increased dizziness, unbalanced gait, blurry vision, worsening memory trouble, often partial improvement with steroid package.  Most recent MRI cervical was in July 2011, remote demyelinating plaque at C2 and C4, no enhancing lesions.  Repeat MRI brain and cervical in Jan 2013 showed a defined hyperintensity in the spinal cord at C2 which likely represents remote age inactive demyelinating plaque. No enhancing lesions are noted. As compared with previous MRI dated 04/30/2010 the C4 spinal cord hyperintensity is no longer seen.  MRI scan of the brain showed multiple brainstem, cerebellar, corpus callosal and periventricular white matter hyperintensities consistent with multiple sclerosis. No enhancing lesions are noted. Presence of T1 black holes  andd corpus callosal atrophy indicate chronic disease. Overall no significant changes compared with MRI  from 01/23/2007   He is doing very well, there was no new neurological symptoms, he is taking Avonex, mild flulike illness right out of the injection, improved by Tylenol, or aleve.  He complains of frequent bilateral frontal headaches.  UPDATE July 9th 2014: He came in early to review MRI of the brain, which has demonstrated multiple periventricular and subcortical and infratentorial chronic demyelinating plaques. No acute plaques. Compared to prior MRI on 01/23/07, there are has been disease progression.  He continues to do very well, reported prior to MRI scan, he has been off of his Avonex for 4 months, due to insurance reasons, after discussed with Mr. Jonathon Vazquez we decided to stay on current Avonex treatment, overall only slight change in his MRI scan there is no significant clinical worsening, we will repeat MRI of the brain and cervical spine with and without contrast in one year.  UPDATE 09/09/2013: He has been using Avenox since 2008,  Last flare up was in 42, dizziness, worsening gait difficulty. He is now doing very well with Avenox,  I had e-mailed with his GI physician Dr. Renata Caprice over the past few weeks, colonoscopy in October 9th has demonstrated,inflammatory stricture, measuring 10 mm had to have called in, biopsy showed inflammation, but he denies significant clinical flareup, such as abdominal pain, or bloody diarrhea,  Dr. Carlean Purl was considering using Remicade, but in light of his MS progression, we both agree Jonathon Vazquez.  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   Constitutional:   fatigue Cardiovascular:  N/A Ear/Nose/Throat:  N/A Skin: N/A Eyes: N/A Respiratory: N/A  Gastroitestinal: N/A    Hematology/Lymphatic:  N/A Endocrine:  N/A Musculoskeletal: aching muscles Allergy/Immunology:  N/A Neurological: memory loss, headache Psychiatric:    Decreased energy   Physical Exam  General: well developed, well  nourished, seated, in no evident distress Head: head  normocephalic and atraumatic.  Oropharynx benign. Neck: supple no carotid bruits Respiratory: clear to auscultation bilaterally Cardiovascular: regular rate rhythm  Neurologic Exam  Mental Status: pleasant, awake, alert, cooperative to history, talking, and casual conversation. Cranial Nerves: CN II-XII pupils were equal round reactive to light.  Fundi were sharp bilaterally.  Extraocular movements were full.  Visual fields were full on confrontational test.  Facial sensation and strength were normal.  Hearing was intact to finger rubbing bilaterally.  Uvula tongue were midline.  Head turning and shoulder shrugging were normal and symmetric.  Tongue protrusion into the cheeks strength were normal.  Motor: mild lower extremity spasticity, no weakness. Sensory: Normal to light touch, pinprick, proprioception, and vibratory sensation. Coordination: mild finger to nose and heel to shin dysmetria. Gait and Station: wide based and mildly unsteady, mild difficulty to perform tiptoe, heel, and moderate difficulty with tandem walking.  Romberg sign: positive. mild trunk ataxia Reflexes: Deep tendon reflexes: Biceps: 2/2, Brachioradialis: 2/2, Triceps: 2/2, Pateller: 2+/2+, Achilles: 1/1.  Plantar responses are flexor.   Assessment and Plan: 42 years old right handed [place] American male, with history of relapsing remitting multiple sclerosis, continued symptoms of unsteady gait,dizziness, memory loss, he has been on Avonex since August 2008, flulike illness with Avonex injection.  Recent colonoscopy showeed flareup of his Crohn's disease  1. JC virus antibody titer. 2. If JC virus is negative or lower titer, we will proceed with Tysarbri infusion, potential side effect including fatal PML was explained to the patient, he  understands the risks, 3 return to clinic in 2 months.

## 2013-09-10 LAB — CBC WITH DIFFERENTIAL
Basophils Absolute: 0 10*3/uL (ref 0.0–0.2)
Basos: 0 %
HCT: 40.1 % (ref 37.5–51.0)
Hemoglobin: 13.9 g/dL (ref 12.6–17.7)
Lymphocytes Absolute: 1.2 10*3/uL (ref 0.7–3.1)
Lymphs: 21 %
MCH: 28.1 pg (ref 26.6–33.0)
MCV: 81 fL (ref 79–97)
Monocytes: 10 %
Platelets: 200 10*3/uL (ref 150–379)
RBC: 4.94 x10E6/uL (ref 4.14–5.80)
RDW: 13.2 % (ref 12.3–15.4)
WBC: 5.9 10*3/uL (ref 3.4–10.8)

## 2013-09-10 LAB — COMPREHENSIVE METABOLIC PANEL
ALT: 6 IU/L (ref 0–44)
Alkaline Phosphatase: 84 IU/L (ref 39–117)
CO2: 33 mmol/L — ABNORMAL HIGH (ref 18–29)
Calcium: 9.5 mg/dL (ref 8.7–10.2)
Chloride: 100 mmol/L (ref 96–108)
GFR calc Af Amer: 104 mL/min/{1.73_m2} (ref >59)
Globulin, Total: 3.2 g/dL (ref 1.5–4.5)
Glucose: 68 mg/dL (ref 65–99)
Potassium: 4.4 mmol/L (ref 3.5–5.2)
Total Bilirubin: 1 mg/dL (ref 0.0–1.2)
Total Protein: 7.4 g/dL (ref 6.0–8.5)

## 2013-09-16 ENCOUNTER — Ambulatory Visit (INDEPENDENT_AMBULATORY_CARE_PROVIDER_SITE_OTHER): Payer: Medicare Other | Admitting: Physician Assistant

## 2013-09-16 ENCOUNTER — Encounter: Payer: Self-pay | Admitting: Physician Assistant

## 2013-09-16 VITALS — BP 106/60 | HR 88 | Temp 98.2°F | Resp 20 | Wt 164.0 lb

## 2013-09-16 DIAGNOSIS — H5789 Other specified disorders of eye and adnexa: Secondary | ICD-10-CM | POA: Diagnosis not present

## 2013-09-16 NOTE — Progress Notes (Signed)
Patient ID: KRISTI HYER MRN: [id number], DOB: 1970/10/28, 42 y.o. Date of Encounter: 09/16/2013, 10:46 AM    Chief Complaint:  Chief Complaint  Patient presents with  . red left eye x 2 weeks    denies any pain, drainage     HPI: 42 y.o. year old AA male is here for evaluation of redness in his left eye.  He says that this has happened in the past. He says he saw Dr. Katy Fitch. Was given some drops that he used but says that his eye got no better. Says that he had followup with Dr. Katy Fitch  but he told him to continue the same drops. Says the  drops never really helped,  but eventually the redness had improved. However now with another episode of the same type of redness. Says that his insurance requires a referral in order for him to see a specialist.  He has had no itching. He has had no drainage from the eye. No watery drainage and no mucousy drainage. His eyes have not had any crust on the eyelashes in the mornings and they have not been stuck together. He has had no rash or pain on the skin surrounding the eye. He has had no vision change. Nothing has gotten in the eye as far as he is aware.     Home Meds: See attached medication section for any medications that were entered at today's visit. The computer does not put those onto this list.The following list is a list of meds entered prior to today's visit.   Current Outpatient Prescriptions on File Prior to Visit  Medication Sig Dispense Refill  . acetaminophen (TYLENOL) 325 MG tablet Take 650 mg by mouth every 6 (six) hours as needed.      . interferon beta-1a (AVONEX PEN) 30 MCG/0.5ML injection Inject 0.5 mLs (30 mcg total) into the muscle every 7 (seven) days.  1 kit  11   No current facility-administered medications on file prior to visit.    Allergies: No Known Allergies    Review of Systems: See HPI for pertinent ROS. All other ROS negative.    Physical Exam: Blood pressure 106/60, pulse 88, temperature 98.2 F  (36.8 C), temperature source Oral, resp. rate 20, weight 164 lb (74.39 kg)., Body mass index is 24.21 kg/(m^2). General: WNWD AAM . Appears in no acute distress. HEENT: Right Eye: There are a few inflamed vessels at the inferior portion of the right. Otherwise the right eye appears normal. Left Eye: The inferior medial one fourth of the left eye has a diffuse area of diffuse pink erythema. Remainder of the appears normal. Neck: Supple. No thyromegaly. No lymphadenopathy. Lungs: Clear bilaterally to auscultation without wheezes, rales, or rhonchi. Breathing is unlabored. Heart: Regular rhythm. No murmurs, rubs, or gallops. Msk:  Strength and tone normal for age. Extremities/Skin: Warm and dry. No clubbing or cyanosis. No edema. No rashes or suspicious lesions. Neuro: Alert and oriented X 3. Moves all extremities spontaneously. Gait is normal. CNII-XII grossly in tact. Psych:  Responds to questions appropriately with a normal affect.     ASSESSMENT AND PLAN:  42 y.o. year old male with  1. Redness of left eye - Ambulatory referral to Ophthalmology I told patient if he had not received a phone call with the referral in 48 hours to call us to make sure that he is followup. If he develops any new signs or symptoms then followup sooner.  Signed, Olean Ree Corona, [place], [place]  09/16/2013 10:46 AM

## 2013-09-23 ENCOUNTER — Ambulatory Visit: Payer: Medicare Other | Admitting: Physician Assistant

## 2013-10-02 DIAGNOSIS — H15009 Unspecified scleritis, unspecified eye: Secondary | ICD-10-CM | POA: Diagnosis not present

## 2013-10-09 DIAGNOSIS — H15009 Unspecified scleritis, unspecified eye: Secondary | ICD-10-CM | POA: Diagnosis not present

## 2013-10-15 DIAGNOSIS — H15009 Unspecified scleritis, unspecified eye: Secondary | ICD-10-CM | POA: Diagnosis not present

## 2013-10-24 DIAGNOSIS — Z9049 Acquired absence of other specified parts of digestive tract: Secondary | ICD-10-CM

## 2013-10-24 HISTORY — PX: COLON SURGERY: SHX602

## 2013-10-24 HISTORY — DX: Acquired absence of other specified parts of digestive tract: Z90.49

## 2013-11-13 ENCOUNTER — Telehealth: Payer: Self-pay | Admitting: Neurology

## 2013-11-15 ENCOUNTER — Encounter (INDEPENDENT_AMBULATORY_CARE_PROVIDER_SITE_OTHER): Payer: Self-pay

## 2013-11-15 ENCOUNTER — Encounter: Payer: Self-pay | Admitting: Neurology

## 2013-11-15 ENCOUNTER — Ambulatory Visit (INDEPENDENT_AMBULATORY_CARE_PROVIDER_SITE_OTHER): Payer: Medicare Other | Admitting: Neurology

## 2013-11-15 VITALS — BP 105/63 | HR 79 | Ht 71.0 in | Wt 164.0 lb

## 2013-11-15 DIAGNOSIS — G35 Multiple sclerosis: Secondary | ICD-10-CM

## 2013-11-15 NOTE — Telephone Encounter (Signed)
error 

## 2013-11-15 NOTE — Progress Notes (Signed)
History of Present Illness:   Mr. Mcglinn, 43 year-old black male returns today for followup of RRMS.  He was seen by his GNA since 2000, initially presenting with facial numbness, MRI demonstrates lesions consistent with MS, however he has lost followups, was suggested Copaxone in 2004, but never followed through, he was reevaluated by Dr. Ron Parker in February of 2008, he began to have bladder incontinence, dizziness, blurry vision, unbalanced gait, repeat MRI at that time continued to demonstrate multiple foci of signal hyperintensity, involving brainstem, superior peduncle, deep white matters, there was pericallosal involvement, no enhancement, but there was positive DWIs lesion, he was started on Avenox since 06/13/2007,  he continued to have flulike illness following injection,  He also reported a history of Crohn's disease, last treatment was in 2004 with Ascol, he no longer has significant GI symptoms, not on any immunosuppressive medications.  He was treated with immunosuppressive treatment since 1992.  He had one to 2 flareups each year,  presenting with increased dizziness, unbalanced gait, blurry vision, worsening memory trouble, often partial improvement with steroid package.  Most recent MRI cervical was in July 2011, remote demyelinating plaque at C2 and C4, no enhancing lesions.  Repeat MRI brain and cervical in Jan 2013 showed a defined hyperintensity in the spinal cord at C2 which likely represents remote age inactive demyelinating plaque. No enhancing lesions are noted. As compared with previous MRI dated 04/30/2010 the C4 spinal cord hyperintensity is no longer seen.  MRI scan of the brain showed multiple brainstem, cerebellar, corpus callosal and periventricular white matter hyperintensities consistent with multiple sclerosis. No enhancing lesions are noted. Presence of T1 black holes  andd corpus callosal atrophy indicate chronic disease. Overall no significant changes compared with MRI  from 01/23/2007   He is doing very well, there was no new neurological symptoms, he is taking Avonex, mild flulike illness right out of the injection, improved by Tylenol, or aleve.  He complains of frequent bilateral frontal headaches.  We have reviewed MRI of the brain, which has demonstrated multiple periventricular and subcortical and infratentorial chronic demyelinating plaques. No acute plaques. Compared to prior MRI on 01/23/07, there are has been disease progression.  He continues to do very well, reported prior to MRI scan, he has been off of his Avonex for 4 months, due to insurance reasons, after discussed with Mr. Rathbone we decided to stay on current Avonex treatment, overall only slight change in his MRI scan there is no significant clinical worsening, we will repeat MRI of the brain and cervical spine with and without contrast in one year.  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 Carlean Purl, he has been treated with prednisone, mercaptopurine,( which is a nucleoside metabolic inhibitor that results in cell-cycle arrest and death) and mesalamine in the past,   We decided not to proceed with [name],  Keep him on Avonex he complains of 3 weeks history of left arm numbness, since January 2015, has much improved now,      Review of system:Out of a complete 14 system review, the patient complains of only the following symptoms, and all other reviewed systems are negative.   Mild gait difficulty, left arm numbness   Physical Exam  General: well developed, well  nourished, seated, in no evident distress Head: head  normocephalic and atraumatic.  Oropharynx benign.  Neck: supple no carotid bruits Respiratory: clear to auscultation bilaterally Cardiovascular: regular rate rhythm  Neurologic  Exam  Mental Status: pleasant, awake, alert, cooperative to history, talking, and casual conversation. Cranial Nerves: CN II-XII pupils were equal round reactive to light.  Fundi were sharp bilaterally.  Extraocular movements were full.  Visual fields were full on confrontational test.  Facial sensation and strength were normal.  Hearing was intact to finger rubbing bilaterally.  Uvula tongue were midline.  Head turning and shoulder shrugging were normal and symmetric.  Tongue protrusion into the cheeks strength were normal.  Motor: mild lower extremity spasticity, no weakness. Sensory: Normal to light touch, pinprick, proprioception, and vibratory sensation. Coordination: mild finger to nose and heel to shin dysmetria. Gait and Station: wide based and mildly unsteady, mild difficulty to perform tiptoe, heel, and moderate difficulty with tandem walking.  Romberg sign: positive. mild trunk ataxia Reflexes: Deep tendon reflexes: Biceps: 2/2, Brachioradialis: 2/2, Triceps: 2/2, Pateller: 2+/2+, Achilles: 1/1.  Plantar responses are flexor.   Assessment and Plan: 43 years old right handed [place] American male, with history of relapsing remitting multiple sclerosis, Crohn's disease, mild unsteady gait,dizziness, memory loss, he has been on Avonex since August 2008, flulike illness with Avonex injection.  there was slight progression on the repeat MRI in 2014, compared to 2008,  Recent colonoscopy showed inflammatory constriction  He is JC antibody positive, also had previous exposure to  mercaptopurine, after discussed with patient, and his wife, we decided to continue Avonex for his relapsing remitting multiple sclerosis, he is to continue followup with his GI Dr. Carlean Purl for the treatment choice for his Crohn's disease.  RTC in one year

## 2013-12-05 ENCOUNTER — Encounter: Payer: Self-pay | Admitting: Physician Assistant

## 2013-12-05 ENCOUNTER — Ambulatory Visit (INDEPENDENT_AMBULATORY_CARE_PROVIDER_SITE_OTHER)
Admission: RE | Admit: 2013-12-05 | Discharge: 2013-12-05 | Disposition: A | Discharge status: Home / Self Care | Payer: Medicare Other | Source: Ambulatory Visit | Attending: Physician Assistant | Admitting: Physician Assistant

## 2013-12-05 ENCOUNTER — Telehealth: Payer: Self-pay | Admitting: Internal Medicine

## 2013-12-05 ENCOUNTER — Ambulatory Visit (INDEPENDENT_AMBULATORY_CARE_PROVIDER_SITE_OTHER): Payer: Medicare Other | Admitting: Physician Assistant

## 2013-12-05 VITALS — BP 90/60 | HR 89 | Ht 71.0 in | Wt 164.4 lb

## 2013-12-05 DIAGNOSIS — K56609 Unspecified intestinal obstruction, unspecified as to partial versus complete obstruction: Secondary | ICD-10-CM | POA: Diagnosis not present

## 2013-12-05 DIAGNOSIS — R110 Nausea: Secondary | ICD-10-CM

## 2013-12-05 DIAGNOSIS — K5090 Crohn's disease, unspecified, without complications: Secondary | ICD-10-CM | POA: Diagnosis not present

## 2013-12-05 DIAGNOSIS — K56699 Other intestinal obstruction unspecified as to partial versus complete obstruction: Secondary | ICD-10-CM

## 2013-12-05 DIAGNOSIS — R109 Unspecified abdominal pain: Secondary | ICD-10-CM

## 2013-12-05 DIAGNOSIS — R1084 Generalized abdominal pain: Secondary | ICD-10-CM | POA: Diagnosis not present

## 2013-12-05 DIAGNOSIS — R52 Pain, unspecified: Secondary | ICD-10-CM | POA: Diagnosis not present

## 2013-12-05 MED ORDER — PREDNISONE 20 MG PO TABS: ORAL_TABLET | ORAL | Status: DC

## 2013-12-05 MED ORDER — HYDROCODONE-ACETAMINOPHEN 5-325 MG PO TABS: ORAL_TABLET | ORAL | Status: DC

## 2013-12-05 NOTE — Telephone Encounter (Signed)
2 day history of abdominal cramping and pain.  He has had one episode of vomiting, but remains nauseous.  No fever.  He will come in today and see Amy Esterwood PA at 3:00.  Discussed with Nicoletta Ba PA patient to get a multiview abdomen prior to the appt.  Wife is asked to have him arrive at 2:00 for xray

## 2013-12-05 NOTE — Progress Notes (Signed)
Subjective:    Patient ID: Jonathon Vazquez, male    DOB: 1971-07-02, 43 y.o.   MRN: [id number]  HPI  [name] is a pleasant 43 year old African American male known to Dr. Carlean Purl with history of Crohn's ileocolitis. He also has multiple sclerosis and is maintained on Avonex. He last underwent colonoscopy in October of 2014 and was found to have an inflammatory stricture in the colon about 10 mm in length in the left colon at 30-40 cm. He also had erosions in the terminal ileum. Biopsies were consistent with an active ileitis. Biopsies from the stricture showed granulation tissue with associated inflammatory exudate. He has not been on any specific medicines for his Crohn's as it was felt that his interferon is treating both of his diseases. Patient comes in today stating that he has been having "bad" stomach cramping over the past 3 days. He is getting waves of pain about every 45 minutes. He has had an episode of vomiting each day over the past couple of days and is eating much less. He is not having any gross abdominal distention. He has been able to have bowel movements but says his stools are significantly smaller in caliber. He has not seen any blood. No fever or chills.  Abdominal films were done prior to his office visit today in the showed scattered air-fluid levels with some mild wall thickening in the colon question a degree of enterocolitis obstruction less likely no free air    Review of Systems  Constitutional: Positive for appetite change.  HENT: Negative.   Eyes: Negative.   Respiratory: Negative.   Cardiovascular: Negative.   Gastrointestinal: Positive for nausea, vomiting, abdominal pain and diarrhea.  Endocrine: Negative.   Genitourinary: Negative.   Musculoskeletal: Negative.   Skin: Negative.   Allergic/Immunologic: Negative.   Neurological: Negative.   Hematological: Negative.   Psychiatric/Behavioral: Negative.    Outpatient Prescriptions Prior to Visit  Medication Sig  Dispense Refill  . acetaminophen (TYLENOL) 325 MG tablet Take 650 mg by mouth every 6 (six) hours as needed.      . interferon beta-1a (AVONEX PEN) 30 MCG/0.5ML injection Inject 0.5 mLs (30 mcg total) into the muscle every 7 (seven) days.  1 kit  11   No facility-administered medications prior to visit.      No Known Allergies Patient Active Problem List   Diagnosis Date Noted  . PDA (patent ductus arteriosus) 03/01/2012  . 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 MULTIPLE SCLEROSIS 12/11/2008  . Crohn's colitis 12/11/2008   History  Substance Use Topics  . Smoking status: Former Smoker    Types: Cigarettes    Quit date: 10/24/2004  . Smokeless tobacco: Never Used  . Alcohol Use: No   family history includes Diabetes in his paternal grandmother; Prostate cancer in his paternal grandfather; Ulcerative colitis in his father. There is no history of Colon cancer.  Objective:   Physical Exam  well-developed thin African American male in no acute distress, pleasant blood pressure 90/60 pulse 89 height 5 foot 11 weight 164. HEENT; nontraumatic normocephalic EOMI PERRLA sclera anicteric, Supple ;no JVD, Cardiovascular ;regular rate and rhythm with S1-S2 no murmur or gallop , Pulmonar;y clear bilaterally, Abdomen ;is soft bowel sounds somewhat hyperactive no obvious distention is tender across the lower abdomen left greater than right no guarding or rebound no palpable mass or hepatosplenomegaly, Rectal; exam not done, Extremities;no clubbing cyanosis or edema skin warm and dry, Psych ;mood and affect normal  and appropriate        Assessment & Plan:  #1  43 year old male with Crohn's ileocolitis now presenting with acute colicky abdominal pain x3 days with intermittent vomiting. I think he does have a low-grade obstruction secondary to the inflammatory colonic stricture. He also has had active ileitis.  #2 Multiple sclerosis-on Avonex #3 patent ductus  arteriosus  Plan; full liquid diet over the next 4-5 days until improved Start prednisone 40 mg by mouth every morning Hydrocodone 5/325 one every 4-6 hours as needed for pain Continue weekly Injections Patient is advised that if his symptoms worsen and he has persistent vomiting he may require hospitalization. We also discussed the possibility of a partial colon resection which he is not opposed to as he just wants to feel well. Plan office followup in 2 weeks or sooner if needed with myself or Dr. Carlean Purl. If he responds to steroids will still need to discuss alternative medications versus surgical referral

## 2013-12-05 NOTE — Patient Instructions (Signed)
We sent a prescription to [facility] [address]. 1. Prednisone 20 mg tablets. Take 2 daily. 2. We have given you a prescription to take to the pharmacy.  We made you a follow up appointment with Nicoletta Ba PA-C  On 12-19-2013 at 1:30 PM .  Call next week if your symptoms are not improving.

## 2013-12-05 NOTE — Progress Notes (Signed)
Agree with Ms. Esterwood's assessment and plan. Alfonso Shackett E. Shanekia Latella, MD, FACG   

## 2013-12-19 ENCOUNTER — Ambulatory Visit: Payer: Medicare Other | Admitting: Physician Assistant

## 2013-12-24 DIAGNOSIS — G35 Multiple sclerosis: Secondary | ICD-10-CM | POA: Diagnosis not present

## 2013-12-25 ENCOUNTER — Ambulatory Visit (INDEPENDENT_AMBULATORY_CARE_PROVIDER_SITE_OTHER): Payer: Medicare Other | Admitting: Physician Assistant

## 2013-12-25 ENCOUNTER — Encounter: Payer: Self-pay | Admitting: Physician Assistant

## 2013-12-25 VITALS — BP 112/80 | HR 76 | Ht 71.0 in | Wt 165.2 lb

## 2013-12-25 DIAGNOSIS — K56609 Unspecified intestinal obstruction, unspecified as to partial versus complete obstruction: Secondary | ICD-10-CM

## 2013-12-25 DIAGNOSIS — K56699 Other intestinal obstruction unspecified as to partial versus complete obstruction: Secondary | ICD-10-CM

## 2013-12-25 DIAGNOSIS — K5010 Crohn's disease of large intestine without complications: Secondary | ICD-10-CM

## 2013-12-25 MED ORDER — PREDNISONE 20 MG PO TABS: ORAL_TABLET | ORAL | Status: DC

## 2013-12-25 NOTE — Progress Notes (Signed)
Subjective:    Patient ID: Jonathon Vazquez, male    DOB: Mar 25, 1971, 43 y.o.   MRN: [id number]  HPI  [name] is a pleasant 43 year old African American male known to Dr. Carlean Purl with history of Crohn's ileocolitis as well as multiple sclerosis. He is currently on Avonex for his MS. Last colonoscopy was in October of 2014 and he was noted to have an inflammatory stricture in the colon at about 30-40 cm measuring about 10 mm in length. At that time he also had erosions in the TI. Biopsies were consistent with an active ileitis and biopsies from stricture showed inflammatory exudate and granulation tissue. He had not been on any specific medicines for his Crohn's but it was hoped that he may get some benefit from the interferon. He was seen in the office about 2-1/2 weeks ago with complaints of "bad" cramping in his abdomen for 3-4 days. He was having wavelike episodes of pain occurring about every 45 minutes and had some intermittent vomiting. He related that his stools were very small in caliber but he had not been seen any blood. Abdominal films were done and showed scattered air-fluid levels with some wall thickening in the colon but no definite obstruction. He was started on prednisone 40 mg by mouth daily and asked to gradually work his way up from a clear liquid diet over the next several days. He comes back in today for followup stating that he feels much better. He seems to tolerate prednisone fairly well. He says his abdominal cramping and pain for the most part have since subsided and he is having fairly normal bowel movements. He is eating without difficulty and is having no nausea or vomiting. Discussions have been had in the past regarding surgical referral for resection of this stricture and also consideration of adding a biologic. Patient at this time is in favor of proceeding with surgery. He has not had any prior abdominal surgery.    Review of Systems  Constitutional: Negative.   HENT:  Negative.   Respiratory: Negative.   Gastrointestinal: Positive for abdominal pain.  Endocrine: Negative.   Genitourinary: Negative.   Musculoskeletal: Negative.   Allergic/Immunologic: Negative.   Neurological: Negative.   Hematological: Negative.   Psychiatric/Behavioral: Negative.    Outpatient Prescriptions Prior to Visit  Medication Sig Dispense Refill  . acetaminophen (TYLENOL) 325 MG tablet Take 650 mg by mouth every 6 (six) hours as needed.      Marland Kitchen HYDROcodone-acetaminophen (NORCO/VICODIN) 5-325 MG per tablet Take 1 tab every 4-6 hours as needed for pain.  40 tablet  0  . interferon beta-1a (AVONEX PEN) 30 MCG/0.5ML injection Inject 0.5 mLs (30 mcg total) into the muscle every 7 (seven) days.  1 kit  11  . predniSONE (DELTASONE) 20 MG tablet Take 2 tab every morning.  60 tablet  0   No facility-administered medications prior to visit.     No Known Allergies Patient Active Problem List   Diagnosis Date Noted  . PDA (patent ductus arteriosus) 03/01/2012  . 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 MULTIPLE SCLEROSIS 12/11/2008  . Crohn's colitis 12/11/2008    History  Substance Use Topics  . Smoking status: Former Smoker    Types: Cigarettes    Quit date: 10/24/2004  . Smokeless tobacco: Never Used  . Alcohol Use: No   family history includes Diabetes in his paternal grandmother; Prostate cancer in his paternal grandfather; Ulcerative colitis in his father. There is no  history of Colon cancer.  Objective:   Physical Exam well-developed thin African American male in no acute distress, pleasant blood pressure 112/80 pulse 76 height 5 foot 11 weight 165. HEENT; nontraumatic normocephalic EOMI PERRLA sclera anicteric, Cardiovascular; regular rate and rhythm with S1-S2 no murmur or gallop, Pulmonary ;clear bilaterally, Abdomen ;soft nondistended bowel sounds are active there he minimally tender in the left lower quadrant there is no guarding or rebound  no palpable mass or hepatosplenomegaly, Rectal; exam not done, ext;no clubbing cyanosis or edema skin warm and dry, Psych ;mood and affect appropriate        Assessment & Plan:  #96  43 year old male with Crohn's ileocolitis with active ileitis at last colonoscopy as well as an inflammatory stricture in the descending colon which has been problematic with recent low-grade obstructive symptoms. He seems to be responding to prednisone. #2 multiple sclerosis-on Avonex #3 PDA  Plan; Will start steroid taper. He will decrease prednisone to 30 mg by mouth daily x2 weeks and then decrease by 5 mg every 2 weeks. I discussed surgical referral again with the patient and he is definitely in favor of proceeding with surgery over further immunosuppression at this time. Will discuss with Dr. Carlean Purl regarding referral locally or to  [facility] or [facility], and arrange accordingly He will be scheduled for followup office this with Dr. Carlean Purl in 3-4 weeks and knows to call in the interim if he has any problems with recurrent symptoms as  he tapers steroids

## 2013-12-25 NOTE — Patient Instructions (Addendum)
We sent a prescription for Prednisone 20 mg, tapered dose. Take as directed. Start with 30 mg daily x 14 days then decrease by 5 mg every 14 days .    We made you an office visit with Dr. Carlean Purl on 02-10-2014 at 8:30 am. Please call if you need to be seen sooner.   We will call you with a referral consult appointment with [facility].

## 2013-12-26 ENCOUNTER — Encounter: Payer: Self-pay | Admitting: Internal Medicine

## 2013-12-26 NOTE — Progress Notes (Signed)
Agree with Ms. Genia Harold assessment and plan.  I have also called the patient - though he seems to improve some with prednisone I believe he does have a fibrostenotic area in the left colon and consideration of surgical treatment is reasonable. I explained to him that he may need additional immunosuppressive trial depending upon what Dr. Sheryn Bison thinks. Will refer to Dr. Kem Parkinson.  Gatha Mayer, MD, [facility]

## 2013-12-27 ENCOUNTER — Telehealth: Payer: Self-pay

## 2013-12-27 NOTE — Telephone Encounter (Signed)
Message copied by Marlon Pel on Fri Dec 27, 2013 11:29 AM ------      Message from: Silvano Rusk E      Created: Thu Dec 26, 2013  4:53 PM      Regarding: refer to Kem Parkinson       Please get him an appointment with Dr. Sheryn Bison      I created a letter which explains      Will need usual records supplied ------

## 2013-12-27 NOTE — Telephone Encounter (Signed)
Patient notified of referral.  He is aware that he will be contacted by [facility] directly with an appt.

## 2014-01-07 ENCOUNTER — Ambulatory Visit (INDEPENDENT_AMBULATORY_CARE_PROVIDER_SITE_OTHER): Payer: Medicare Other | Admitting: Internal Medicine

## 2014-01-07 ENCOUNTER — Encounter: Payer: Self-pay | Admitting: Internal Medicine

## 2014-01-07 VITALS — BP 100/62 | HR 76 | Ht 71.0 in | Wt 169.0 lb

## 2014-01-07 DIAGNOSIS — K5010 Crohn's disease of large intestine without complications: Secondary | ICD-10-CM

## 2014-01-07 DIAGNOSIS — K56609 Unspecified intestinal obstruction, unspecified as to partial versus complete obstruction: Secondary | ICD-10-CM | POA: Diagnosis not present

## 2014-01-07 DIAGNOSIS — K56699 Other intestinal obstruction unspecified as to partial versus complete obstruction: Secondary | ICD-10-CM

## 2014-01-07 MED ORDER — CHOLECALCIFEROL 25 MCG (1000 UT) PO CAPS: 1000.0000 [IU] | ORAL_CAPSULE | Freq: Every day | ORAL | Status: DC

## 2014-01-07 MED ORDER — CALCIUM CARBONATE ANTACID 500 MG PO CHEW: 3.0000 | CHEWABLE_TABLET | Freq: Two times a day (BID) | ORAL | Status: DC

## 2014-01-07 MED ORDER — PREDNISONE 10 MG PO TABS: 15.0000 mg | ORAL_TABLET | Freq: Every day | ORAL | Status: DC

## 2014-01-07 MED ORDER — HYDROCODONE-ACETAMINOPHEN 10-325 MG PO TABS: 1.0000 | ORAL_TABLET | Freq: Four times a day (QID) | ORAL | Status: DC | PRN

## 2014-01-07 NOTE — Assessment & Plan Note (Addendum)
Awaiting surgical evaluation - could try to add a biologic agent but my sense is most of stricture is cicatrix and not inflammatory Taper prednisone to 10 mg Stay on stool softeners/MiraLax

## 2014-01-07 NOTE — Assessment & Plan Note (Addendum)
Most of the colitis has been in remission on colonoscopy but has a persistent inflammatory stricture in descending colon. Awaiting surgical evaluation Taper prednisone to 10 mg over next month Rx hydrocodone 10 mg 325 mg APAP #60 today

## 2014-01-07 NOTE — Progress Notes (Signed)
Subjective:    Patient ID: RED MANDT, male    DOB: [birth year], 43 y.o.   MRN: [id number]  HPI The patient is here for f/u Crohn's colitis with descending colon stricture. He has been having problems w/ LLQ pain and some obstructive sxs helped by prednisone but not totally relieved. Had been ok x years on Avonex for MS but has had some flares of Crohn's in past year. Neurologist checked into [facility] for the Crohn's and his MS but he was JC+ so could not do. He is tapering prednisone (on 20 x 2-3 weeks) and is ok as long as he uses stool softeners - defecating without too much problem but he is still having LLQ pain and needs 2- 5 mg hydrocodone to relieve - using about 2 each day He is willing to consider surgery now and is waiting to be seen at [facility] - was seen years ago by Dr. Isabella Stalling but deferred surgery then.  No Known Allergies Outpatient Prescriptions Prior to Visit  Medication Sig Dispense Refill  . acetaminophen (TYLENOL) 325 MG tablet Take 650 mg by mouth every 6 (six) hours as needed.      . interferon beta-1a (AVONEX PEN) 30 MCG/0.5ML injection Inject 0.5 mLs (30 mcg total) into the muscle every 7 (seven) days.  1 kit  11  . HYDROcodone-acetaminophen (NORCO/VICODIN) 5-325 MG per tablet Take 1 tab every 4-6 hours as needed for pain.  40 tablet  0  . predniSONE (DELTASONE) 20 MG tablet Take as directed.  120 tablet  0   No facility-administered medications prior to visit.   Past Medical History  Diagnosis Date  . Crohn's disease   . Patent ductus arteriosus   . Atrial fibrillation   . Multiple sclerosis   . Colon stricture     descending colon  . Anal fistula 1996  . Anal stenosis   . Regional enteritis of large intestine    Past Surgical History  Procedure Laterality Date  . Colonoscopy      multiple   History   Social History  . Marital Status: Married    Spouse Name: Elwood    Number of Children: 3  . Years of Education: 13   Occupational History    . disabled    Social History Main Topics  . Smoking status: Former Smoker    Types: Cigarettes    Quit date: 10/24/2004  . Smokeless tobacco: Never Used  . Alcohol Use: No  . Drug Use: No  .            Social History Narrative   Married, 1 son one daughter. 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 Multimedia programmer)   Disabled, previously worked at [employer], disability due to multiple sclerosis   Caffeine one daily   Left handed.   Education- one year of college.   Family History  Problem Relation Age of Onset  . Prostate cancer Paternal Grandfather   . Diabetes Paternal Grandmother   . Colon cancer Neg Hx   . 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 Awaiting surgical evaluation Taper prednisone.    Cc: Kem Parkinson, MD

## 2014-01-07 NOTE — Patient Instructions (Addendum)
We are going to check on your [facility] appointment.    Please take Vitamin D 1000 IU daily.  Also take Calcium 1250m daily (you can get this from [employer]).   Decrease your Prednisone to 121mdaily.  Use Miralax as needed for constipation.  Today we are providing you with a written rx for generic Norco to use as needed for pain.   Call usKorean 2 weeks at [phone] an update on how your doing please.  You may leave the message for [facility].   I appreciate the opportunity to care for you.

## 2014-01-08 ENCOUNTER — Telehealth: Payer: Self-pay

## 2014-01-08 NOTE — Telephone Encounter (Signed)
Message copied by Marlon Pel on Wed Jan 08, 2014  4:24 PM ------      Message from: Gatha Mayer      Created: Tue Jan 07, 2014  8:24 PM      Regarding: Appt dr. Sheryn Bison ?       He has not heard re: appt Barbera Setters, please check on this ------

## 2014-01-08 NOTE — Telephone Encounter (Signed)
Patient is scheduled for Dr. Kem Parkinson at [facility] 01/22/14 11:45.  I have notified him of the appt and provided him the contact information

## 2014-01-09 NOTE — Telephone Encounter (Signed)
He should get a copy of his CT images to take by disk

## 2014-01-13 ENCOUNTER — Telehealth: Payer: Self-pay | Admitting: Internal Medicine

## 2014-01-13 NOTE — Telephone Encounter (Signed)
Patient is scheduled to see a Dr. Jonni Sanger.  He is advised most likely will be seen in conjunction with Dr. Sheryn Bison.  He is advised to call them and ask.  If he wants to see only her he will need to let them know to be rescheduled.

## 2014-01-27 ENCOUNTER — Other Ambulatory Visit: Payer: Self-pay

## 2014-01-27 MED ORDER — INTERFERON BETA-1A 30 MCG/0.5ML IM KIT: 30.0000 ug | PACK | INTRAMUSCULAR | Status: DC

## 2014-01-29 ENCOUNTER — Other Ambulatory Visit: Payer: Self-pay | Admitting: Neurology

## 2014-02-10 ENCOUNTER — Telehealth: Payer: Self-pay

## 2014-02-10 ENCOUNTER — Encounter: Payer: Self-pay | Admitting: Internal Medicine

## 2014-02-10 ENCOUNTER — Ambulatory Visit (INDEPENDENT_AMBULATORY_CARE_PROVIDER_SITE_OTHER): Payer: Medicare Other | Admitting: Internal Medicine

## 2014-02-10 VITALS — BP 110/70 | HR 72 | Ht 69.5 in | Wt 175.4 lb

## 2014-02-10 DIAGNOSIS — K56609 Unspecified intestinal obstruction, unspecified as to partial versus complete obstruction: Secondary | ICD-10-CM

## 2014-02-10 DIAGNOSIS — K56699 Other intestinal obstruction unspecified as to partial versus complete obstruction: Secondary | ICD-10-CM

## 2014-02-10 DIAGNOSIS — K5010 Crohn's disease of large intestine without complications: Secondary | ICD-10-CM | POA: Diagnosis not present

## 2014-02-10 MED ORDER — POLYETHYLENE GLYCOL 3350 17 GM/SCOOP PO POWD: 17.0000 g | Freq: Every day | ORAL | Status: DC | PRN

## 2014-02-10 MED ORDER — HYDROCODONE-ACETAMINOPHEN 10-325 MG PO TABS: 1.0000 | ORAL_TABLET | Freq: Four times a day (QID) | ORAL | Status: DC | PRN

## 2014-02-10 NOTE — Telephone Encounter (Signed)
Spoke to [facility] at [employer], she is going to put Garnet's CT from 12/15/2008 on a disc for him to come by and pick up to take to his [facility] appt in May.  Patient aware.

## 2014-02-10 NOTE — Assessment & Plan Note (Signed)
Stable - off prednisone now Awaiting [facility] colorectal surgery evaluation

## 2014-02-10 NOTE — Patient Instructions (Addendum)
Today we are giving you a printed rx for norco to take to your pharmacy.   Please go by [facility] and get the disc for the CT scan's you've had to take to your [facility] appointment.    I appreciate the opportunity to care for you.

## 2014-02-10 NOTE — Progress Notes (Signed)
   Subjective:    Patient ID: Jonathon Vazquez, male    DOB: 10/12/1971, 43 y.o.   MRN: [id number]  HPI Still having some LLQ pain helped by Percocet (wants refill) and moving bowels though narrow caliber Has [facility] appt 5/13 - Dr. Thacker He s using MiraLax qod  Medications, allergies, past medical history, past surgical history, family history and social history are reviewed and updated in the EMR.   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 Stable - off prednisone Awaiting [facility] colorectal surgery appointment Refill Percocet he takes 1-2/day for pain Continue MiraLax (about qod)   Crohn's colitis Stable - off prednisone now Awaiting [facility] colorectal surgery evaluation   Current outpatient prescriptions:acetaminophen (TYLENOL) 325 MG tablet, Take 650 mg by mouth every 6 (six) hours as needed., Disp: , Rfl: ;  AVONEX PEN 30 MCG/0.5ML injection, Inject 30 mcg Intramuscularly once a week Rotate injection site, Disp: 1 kit, Rfl: 11;  calcium carbonate (TUMS) 500 MG chewable tablet, Chew 3 tablets (600 mg of elemental calcium total) by mouth 2 (two) times daily., Disp: , Rfl:  Cholecalciferol ([employer] VITAMIN D3) 1000 UNITS capsule, Take 1 capsule (1,000 Units total) by mouth daily., Disp: , Rfl: ;  HYDROcodone-acetaminophen (NORCO) 10-325 MG per tablet, Take 1 tablet by mouth every 6 (six) hours as needed., Disp: 60 tablet, Rfl: 0;  polyethylene glycol powder (MIRALAX) powder, Take 17 g by mouth daily as needed (constipation)., Disp: 255 g, Rfl: 0  

## 2014-02-10 NOTE — Assessment & Plan Note (Addendum)
Stable - off prednisone Awaiting [facility] colorectal surgery appointment Refill Percocet he takes 1-2/day for pain Continue MiraLax (about qod)

## 2014-03-03 ENCOUNTER — Telehealth: Payer: Self-pay | Admitting: Neurology

## 2014-03-03 NOTE — Telephone Encounter (Signed)
Patient received call from [place] to schedule a MRI in June.  Patient is questioning why he's being scheduled for 2nd MRI, had one last year and not understanding the need for the second one.  Please call and advise.

## 2014-03-05 DIAGNOSIS — K5010 Crohn's disease of large intestine without complications: Secondary | ICD-10-CM | POA: Diagnosis not present

## 2014-03-05 DIAGNOSIS — K5080 Crohn's disease of both small and large intestine without complications: Secondary | ICD-10-CM | POA: Diagnosis not present

## 2014-03-05 NOTE — Telephone Encounter (Signed)
Called pt and pt stated that Jonathon Vazquez called wanting to set up appt for pt to have MRI done based on OV on 04/30/13. Pt's last OV was 11/15/13 with Dr. Krista Blue and pt was instructed to come back in 1 year which is 11/17/14 with Hoyle Sauer, NP. Pt is wondering if he needs to have another MRI done, since pt had one last year. Please advise

## 2014-03-05 NOTE — Telephone Encounter (Signed)
I have dicussed with Jonathon Vazquez, he is going to have surgery due to a stricture from his Crohn's disease at [facility]  Repeat MRI of the brain is to keep a close followup on the progression of his MS, he is taking Avonex, previous MRI in 2014 showed mild progression in comparison to 2008,  He will callback to set up of MRI of brain before his next followup appointment with our clinic

## 2014-03-12 ENCOUNTER — Telehealth: Payer: Self-pay | Admitting: Internal Medicine

## 2014-03-12 MED ORDER — HYDROCODONE-ACETAMINOPHEN 10-325 MG PO TABS: 1.0000 | ORAL_TABLET | Freq: Four times a day (QID) | ORAL | Status: DC | PRN

## 2014-03-12 NOTE — Telephone Encounter (Signed)
rx placed out front patient notified

## 2014-03-12 NOTE — Telephone Encounter (Signed)
Patient is scheduled for surgery for 06/26/14 with Dr. Gwinda Maine at [facility].  Patient is requesting a refill on his hydrocodone.  Ok to refill?

## 2014-03-12 NOTE — Telephone Encounter (Signed)
yes

## 2014-03-12 NOTE — Telephone Encounter (Signed)
May we refill [name]? 

## 2014-03-21 ENCOUNTER — Ambulatory Visit: Payer: Medicare Other | Admitting: Nurse Practitioner

## 2014-04-16 ENCOUNTER — Telehealth: Payer: Self-pay | Admitting: Internal Medicine

## 2014-04-16 MED ORDER — HYDROCODONE-ACETAMINOPHEN 10-325 MG PO TABS: 1.0000 | ORAL_TABLET | Freq: Four times a day (QID) | ORAL | Status: DC | PRN

## 2014-04-16 NOTE — Telephone Encounter (Signed)
Ok to refill #90 this time

## 2014-04-16 NOTE — Telephone Encounter (Signed)
Patient informed that rx ready for pick up.

## 2014-04-16 NOTE — Telephone Encounter (Signed)
Please advise [name]? Thank you. 

## 2014-04-30 ENCOUNTER — Inpatient Hospital Stay: Admission: RE | Admit: 2014-04-30 | Payer: Medicare Other | Source: Ambulatory Visit

## 2014-04-30 ENCOUNTER — Other Ambulatory Visit: Payer: Medicare Other

## 2014-05-06 ENCOUNTER — Inpatient Hospital Stay: Admission: RE | Admit: 2014-05-06 | Payer: Medicare Other | Source: Ambulatory Visit

## 2014-05-06 ENCOUNTER — Other Ambulatory Visit: Payer: Medicare Other

## 2014-05-08 ENCOUNTER — Ambulatory Visit
Admission: RE | Admit: 2014-05-08 | Discharge: 2014-05-08 | Disposition: A | Discharge status: Home / Self Care | Payer: Medicare Other | Source: Ambulatory Visit | Attending: Neurology | Admitting: Neurology

## 2014-05-08 DIAGNOSIS — Q250 Patent ductus arteriosus: Secondary | ICD-10-CM

## 2014-05-08 DIAGNOSIS — G35 Multiple sclerosis: Secondary | ICD-10-CM

## 2014-05-08 DIAGNOSIS — K5010 Crohn's disease of large intestine without complications: Secondary | ICD-10-CM

## 2014-05-08 DIAGNOSIS — K56699 Other intestinal obstruction unspecified as to partial versus complete obstruction: Secondary | ICD-10-CM

## 2014-05-08 MED ORDER — GADOBENATE DIMEGLUMINE 529 MG/ML IV SOLN
15.0000 mL | Freq: Once | INTRAVENOUS | Status: AC | PRN
  Administered 2014-05-08: 15 mL via INTRAVENOUS

## 2014-05-20 ENCOUNTER — Telehealth: Payer: Self-pay | Admitting: Neurology

## 2014-05-20 ENCOUNTER — Telehealth: Payer: Self-pay | Admitting: Internal Medicine

## 2014-05-20 MED ORDER — HYDROCODONE-ACETAMINOPHEN 10-325 MG PO TABS: 1.0000 | ORAL_TABLET | Freq: Four times a day (QID) | ORAL | Status: DC | PRN

## 2014-05-20 NOTE — Telephone Encounter (Signed)
Please advise [name]? 

## 2014-05-20 NOTE — Telephone Encounter (Signed)
LM on patient's mobile VM that rx ready and that I need to know when he is having surgery at [facility].  Also LM at home # with family member to call me tomorrow.

## 2014-05-20 NOTE — Telephone Encounter (Signed)
I have called Jonathon Vazquez, MRI brain showed mild progression of his MS, he is scheduled to have surgery in Sept 3rd at [facility] to have partial colectomy.  [name], please give him a follow up appt with me in Oct 2015, potential change in his MS medications

## 2014-05-20 NOTE — Telephone Encounter (Signed)
OK to refill Ask him when surgery is at [facility]

## 2014-05-21 NOTE — Telephone Encounter (Signed)
Per Dr. Krista Blue: I spoke with Mr. Bruhl and scheduled a f/u appointment with Dr. Krista Blue on July 28, 2014 at 8:30 AM.

## 2014-05-21 NOTE — Telephone Encounter (Signed)
Patient came in to pick up script and informed me his upcoming surgery at [facility] is 06/26/14, pre-op 06/21/14.

## 2014-06-11 DIAGNOSIS — G35 Multiple sclerosis: Secondary | ICD-10-CM | POA: Diagnosis not present

## 2014-06-11 DIAGNOSIS — K5080 Crohn's disease of both small and large intestine without complications: Secondary | ICD-10-CM | POA: Diagnosis not present

## 2014-06-20 DIAGNOSIS — R109 Unspecified abdominal pain: Secondary | ICD-10-CM | POA: Diagnosis not present

## 2014-06-20 DIAGNOSIS — K5090 Crohn's disease, unspecified, without complications: Secondary | ICD-10-CM | POA: Diagnosis not present

## 2014-06-20 DIAGNOSIS — K639 Disease of intestine, unspecified: Secondary | ICD-10-CM | POA: Diagnosis not present

## 2014-06-26 DIAGNOSIS — K5080 Crohn's disease of both small and large intestine without complications: Secondary | ICD-10-CM | POA: Diagnosis present

## 2014-06-26 DIAGNOSIS — K56609 Unspecified intestinal obstruction, unspecified as to partial versus complete obstruction: Secondary | ICD-10-CM | POA: Diagnosis not present

## 2014-06-26 DIAGNOSIS — K632 Fistula of intestine: Secondary | ICD-10-CM | POA: Diagnosis not present

## 2014-06-26 DIAGNOSIS — K5669 Other intestinal obstruction: Secondary | ICD-10-CM | POA: Diagnosis not present

## 2014-06-26 DIAGNOSIS — G35 Multiple sclerosis: Secondary | ICD-10-CM | POA: Diagnosis not present

## 2014-06-26 DIAGNOSIS — G8918 Other acute postprocedural pain: Secondary | ICD-10-CM | POA: Diagnosis not present

## 2014-06-26 DIAGNOSIS — R109 Unspecified abdominal pain: Secondary | ICD-10-CM | POA: Diagnosis not present

## 2014-06-26 DIAGNOSIS — K5010 Crohn's disease of large intestine without complications: Secondary | ICD-10-CM | POA: Diagnosis not present

## 2014-06-27 ENCOUNTER — Encounter: Payer: Self-pay | Admitting: Internal Medicine

## 2014-07-10 DIAGNOSIS — L03019 Cellulitis of unspecified finger: Secondary | ICD-10-CM | POA: Diagnosis not present

## 2014-07-25 ENCOUNTER — Encounter: Payer: Self-pay | Admitting: Family Medicine

## 2014-07-25 ENCOUNTER — Ambulatory Visit (INDEPENDENT_AMBULATORY_CARE_PROVIDER_SITE_OTHER): Payer: Medicare Other | Admitting: Family Medicine

## 2014-07-25 VITALS — BP 92/60 | HR 78 | Temp 97.8°F | Resp 18 | Ht 71.0 in | Wt 175.0 lb

## 2014-07-25 DIAGNOSIS — L03012 Cellulitis of left finger: Secondary | ICD-10-CM

## 2014-07-25 NOTE — Progress Notes (Signed)
   Subjective:    Patient ID: Jonathon Vazquez, male    DOB: 02-Dec-1970, 43 y.o.   MRN: [id number]  HPI 10 days ago, the patient was seen in urgent care on his left fourth digit became extremely erythematous, swollen, warm, and exquisitely painful. Patient states the entire finger distal to the PIP joint was red and swollen license normal sized. He was placed on doxycycline for 10 days. The swelling has completely subsided. The finger is no longer red or warm. He does have fragile new pink skin over the radial side of the finger which has not yet regained normal pigmentation. He also has a portion of the skin adjacent to the proximal nail which has completely de-epithelialized. This is approximately 1 cm x 5 mm. There is healthy granulation tissue in this area. Past Medical History  Diagnosis Date  . Crohn's disease   . Patent ductus arteriosus   . Atrial fibrillation   . Multiple sclerosis   . Colon stricture     descending colon  . Anal fistula 1996  . Anal stenosis   . Regional enteritis of large intestine    Past Surgical History  Procedure Laterality Date  . Colonoscopy      multiple   Current Outpatient Prescriptions on File Prior to Visit  Medication Sig Dispense Refill  . acetaminophen (TYLENOL) 325 MG tablet Take 650 mg by mouth every 6 (six) hours as needed.      . AVONEX PEN 30 MCG/0.5ML injection Inject 30 mcg Intramuscularly once a week Rotate injection site  1 kit  11  . calcium carbonate (TUMS) 500 MG chewable tablet Chew 3 tablets (600 mg of elemental calcium total) by mouth 2 (two) times daily.       No current facility-administered medications on file prior to visit.   No Known Allergies History   Social History  . Marital Status: Married    Spouse Name: [place]    Number of Children: 3  . Years of Education: 13   Occupational History  . disabled    Social History Main Topics  . Smoking status: Former Smoker    Types: Cigarettes    Quit date: 10/24/2004    . Smokeless tobacco: Never Used  . Alcohol Use: No  . Drug Use: No  . Sexual Activity: Not on file   Other Topics Concern  . Not on file   Social History Narrative   Married, 1 son one daughter. 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 Multimedia programmer)   Disabled, previously worked at [employer], disability due to multiple sclerosis   Caffeine one daily   Left handed.   Education- one year of college.      Review of Systems  All other systems reviewed and are negative.      Objective:   Physical Exam  Vitals reviewed. Cardiovascular: Normal rate and regular rhythm.   Pulmonary/Chest: Effort normal and breath sounds normal.   please see the description in the history of present illness        Assessment & Plan:  Cellulitis of finger of left hand   Patient's cellulitis has completely resolved. I anticipate pink skin will rapidly regain its normal pigmentation over the next 2-3 weeks. I anticipate that the granulation tissue we epithelialize over the next 2 weeks. I explained this to the patient. Because this normal trajectory, no further followup is necessary. If area becomes reinfected or changes in any way I will see the patient back immediately

## 2014-07-28 ENCOUNTER — Encounter: Payer: Self-pay | Admitting: Neurology

## 2014-07-28 ENCOUNTER — Ambulatory Visit (INDEPENDENT_AMBULATORY_CARE_PROVIDER_SITE_OTHER): Payer: Medicare Other | Admitting: Neurology

## 2014-07-28 VITALS — BP 108/61 | HR 70 | Ht 71.0 in | Wt 169.0 lb

## 2014-07-28 DIAGNOSIS — G35 Multiple sclerosis: Secondary | ICD-10-CM

## 2014-07-28 NOTE — Progress Notes (Signed)
History of Present Illness:   Jonathon Vazquez, 43 year-old black male returns today for followup of RRMS.  He was seen by his GNA since 2000, initially presenting with facial numbness, MRI demonstrates lesions consistent with MS, however he has lost followups, was suggested Copaxone in 2004, but never followed through, he was reevaluated by Dr. Ron Parker in February of 2008, he began to have bladder incontinence, dizziness, blurry vision, unbalanced gait, repeat MRI at that time continued to demonstrate multiple foci of signal hyperintensity, involving brainstem, superior peduncle, deep white matters, there was pericallosal involvement, no enhancement, but there was positive DWIs lesion, he was started on Avenox since 06/13/2007,  he continued to have flulike illness following injection,  He also reported a history of Crohn's disease, last treatment was in 2004 with Ascol, he no longer has significant GI symptoms, not on any immunosuppressive medications.  He was treated with immunosuppressive treatment since 1992.  He had one to 2 flareups each year,  presenting with increased dizziness, unbalanced gait, blurry vision, worsening memory trouble, often partial improvement with steroid package.  Most recent MRI cervical was in July 2011, remote demyelinating plaque at C2 and C4, no enhancing lesions.  Repeat MRI brain and cervical in Jan 2013 showed a defined hyperintensity in the spinal cord at C2 which likely represents remote age inactive demyelinating plaque. No enhancing lesions are noted. As compared with previous MRI dated 04/30/2010 the C4 spinal cord hyperintensity is no longer seen.  MRI scan of the brain showed multiple brainstem, cerebellar, corpus callosal and periventricular white matter hyperintensities consistent with multiple sclerosis. No enhancing lesions are noted. Presence of T1 black holes  andd corpus callosal atrophy indicate chronic disease. Overall no significant changes compared with MRI  from 01/23/2007   He is doing very well, there was no new neurological symptoms, he is taking Avonex, mild flulike illness right out of the injection, improved by Tylenol, or aleve.  He complains of frequent bilateral frontal headaches.  We have reviewed MRI of the brain, which has demonstrated multiple periventricular and subcortical and infratentorial chronic demyelinating plaques. No acute plaques. Compared to prior MRI on 01/23/07, there are has been disease progression.  He continues to do very well, reported prior to MRI scan, he has been off of his Avonex for 4 months, due to insurance reasons, after discussed with Jonathon Vazquez we decided to stay on current Avonex treatment, overall only slight change in his MRI scan there is no significant clinical worsening, we will repeat MRI of the brain and cervical spine with and without contrast in one year.  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 Carlean Purl, he has been treated with prednisone, mercaptopurine,( which is a nucleoside metabolic inhibitor that results in cell-cycle arrest and death) and mesalamine in the past,   We decided not to proceed with [name],  Keep him on Avonex he complains of 3 weeks history of left arm numbness, since January 2015, has much improved now,    UPDATE 07/28/2014: He had laparoscopic surgery in Sept 3rd 2015 at [facility], which did help him,   He has more memory loss. He also complains of fatigue. No recurrent mass symptoms, he denied gait difficulty, no visual disorder  We have reviewed MRI of brain: scattered bilateral periventricular, subcortical, corpus callosum, cerebellar and brainstem white matter hyperintensities typical  for demyelinating disease. No enhancing lesions are noted. The presence of T1 black holes and  mild atrophy of corpus callosum indicates chronic disease. Compared with previous MRI scan dated 04/22/13 there appears to be slight disease activity progression   MRI scan of the cervical spine showing left lateral disc herniation at C5-6 with significant narrowing of the foramina. Spinal cord hyperintensities at C2, C4-5 and in the brainstem and cerebellum likely represent remote age demyelinating plaques. No enhancing lesions are noted.  Review of system:Out of a complete 14 system review, the patient complains of as above  Physical Exam  General: well developed, well  nourished, seated, in no evident distress Head: head  normocephalic and atraumatic.  Oropharynx benign. Neck: supple no carotid bruits Respiratory: clear to auscultation bilaterally Cardiovascular: regular rate rhythm  Neurologic Exam  Mental Status: pleasant, awake, alert, cooperative to history, talking, and casual conversation. Cranial Nerves: CN II-XII pupils were equal round reactive to light.  Fundi were sharp bilaterally.  Extraocular movements were full.  Visual fields were full on confrontational test.  Facial sensation and strength were normal.  Hearing was intact to finger rubbing bilaterally.  Uvula tongue were midline.  Head turning and shoulder shrugging were normal and symmetric.  Tongue protrusion into the cheeks strength were normal.  Motor: mild lower extremity spasticity, no weakness. Sensory: Normal to light touch, pinprick, proprioception, and vibratory sensation. Coordination: mild finger to nose and heel to shin dysmetria. Gait and Station: wide based and mildly unsteady, mild difficulty to perform tiptoe, heel, and moderate difficulty with tandem walking.  Romberg sign: positive. mild trunk ataxia Reflexes: Deep tendon reflexes: Biceps: 2/2, Brachioradialis: 2/2, Triceps: 2/2, Pateller: 2+/2+, Achilles: 1/1.  Plantar responses are flexor.   Assessment and Plan: 43 years old right handed [place]  American male, with history of relapsing remitting multiple sclerosis, Crohn's disease, mild unsteady gait,dizziness, memory loss, he has been on Avonex since August 2008, flulike illness with Avonex injection.  there was slight progression on the repeat MRI in 2015 compared to 2014,  Recent colonoscopy showed inflammatory constriction, he has to go through laparoscopic surgery for adhesion.  He is JC antibody positive, also had previous exposure to  mercaptopurine, after discussed with patient, and his wife, we decided to continue Avonex for his relapsing remitting multiple sclerosis, he is to continue followup with his GI Dr. Carlean Purl for the treatment choice for his Crohn's disease.  RTC in 3 months with nurse practitioner  Order MRI for July 2016, I will see him after repeat MRI, he is to call my office for worsening MS symptoms.

## 2014-08-04 ENCOUNTER — Encounter: Payer: Self-pay | Admitting: Family Medicine

## 2014-08-06 DIAGNOSIS — K50812 Crohn's disease of both small and large intestine with intestinal obstruction: Secondary | ICD-10-CM | POA: Diagnosis not present

## 2014-08-12 ENCOUNTER — Encounter: Payer: Self-pay | Admitting: Internal Medicine

## 2014-08-20 ENCOUNTER — Telehealth: Payer: Self-pay | Admitting: Internal Medicine

## 2014-08-20 NOTE — Telephone Encounter (Signed)
Patient has had recent colon surgery at [facility] and just received a recall letter.  He will come in and discuss colonoscopy on 09/16/14 11:15.  He is advised of the appt date and time

## 2014-08-28 DIAGNOSIS — Z23 Encounter for immunization: Secondary | ICD-10-CM | POA: Diagnosis not present

## 2014-09-16 ENCOUNTER — Encounter: Payer: Self-pay | Admitting: Internal Medicine

## 2014-09-16 ENCOUNTER — Ambulatory Visit (INDEPENDENT_AMBULATORY_CARE_PROVIDER_SITE_OTHER): Payer: Medicare Other | Admitting: Internal Medicine

## 2014-09-16 VITALS — BP 105/60 | HR 72 | Ht 71.0 in | Wt 178.6 lb

## 2014-09-16 DIAGNOSIS — K50118 Crohn's disease of large intestine with other complication: Secondary | ICD-10-CM | POA: Diagnosis not present

## 2014-09-16 DIAGNOSIS — K5669 Other intestinal obstruction: Secondary | ICD-10-CM | POA: Diagnosis not present

## 2014-09-16 DIAGNOSIS — K56699 Other intestinal obstruction unspecified as to partial versus complete obstruction: Secondary | ICD-10-CM

## 2014-09-16 MED ORDER — MESALAMINE 1.2 G PO TBEC: 2.4000 g | DELAYED_RELEASE_TABLET | Freq: Two times a day (BID) | ORAL | Status: DC

## 2014-09-16 NOTE — Assessment & Plan Note (Addendum)
Doing well after stricture resected Start Lialda 2.4 g bid Colonoscopy Feb or March 2016 on mesalamine Will consider other Tx depending upon those results - he prefers conservative Rx

## 2014-09-16 NOTE — Assessment & Plan Note (Signed)
resected

## 2014-09-16 NOTE — Progress Notes (Signed)
   Subjective:    Patient ID: Jonathon Vazquez, male    DOB: 06-Oct-1971, 43 y.o.   MRN: [id number]  HPI The patient is doing well after segmental left colectomy for a benign stricture related to Crohn's disease. No more narcotics. No diarrhea and no constipation.  Medications, allergies, past medical history, past surgical history, family history and social history are reviewed and updated in the EMR.   Review of Systems As above MS is stable on Avonex    Objective:   Physical Exam  WDWN NAD      Assessment & Plan:  Crohn's colitis Doing well after stricture resected Start Lialda 2.4 g bid Colonoscopy Feb or March 2016 on mesalamine Will consider other Tx depending upon those results - he prefers conservative Rx  Stricture of descending colon due to Crohn's disease resected

## 2014-09-16 NOTE — Patient Instructions (Signed)
We have set you up for a pre-visit in February 2016 to discuss prepping for a colonoscopy with Dr. Carlean Purl.  We have sent the following medications to your pharmacy for you to pick up at your convenience: Lialda   I appreciate the opportunity to care for you.

## 2014-09-25 ENCOUNTER — Telehealth: Payer: Self-pay | Admitting: Internal Medicine

## 2014-09-25 NOTE — Telephone Encounter (Signed)
Patient notified

## 2014-09-25 NOTE — Telephone Encounter (Signed)
Since starting on lialda that he is having uncontrollable diarrhea.  He said you discussed this was a possible side effect, but he said he is not able to function due to the diarrhea. Please advise

## 2014-09-25 NOTE — Telephone Encounter (Signed)
Stop the Lialda and add as side effect on his allergies He is supposed to have a colonoscopy and will just do it off medications to reassess things make a determination about the next treatment

## 2014-11-17 ENCOUNTER — Ambulatory Visit: Payer: Medicare Other | Admitting: Nurse Practitioner

## 2014-11-18 ENCOUNTER — Encounter: Payer: Self-pay | Admitting: *Deleted

## 2014-11-18 ENCOUNTER — Ambulatory Visit (INDEPENDENT_AMBULATORY_CARE_PROVIDER_SITE_OTHER): Payer: BLUE CROSS/BLUE SHIELD | Admitting: Neurology

## 2014-11-18 VITALS — BP 103/64 | HR 64 | Ht 71.0 in | Wt 186.0 lb

## 2014-11-18 DIAGNOSIS — G35 Multiple sclerosis: Secondary | ICD-10-CM

## 2014-11-18 DIAGNOSIS — K50118 Crohn's disease of large intestine with other complication: Secondary | ICD-10-CM | POA: Diagnosis not present

## 2014-11-18 DIAGNOSIS — K5010 Crohn's disease of large intestine without complications: Secondary | ICD-10-CM | POA: Diagnosis not present

## 2014-11-18 MED ORDER — MODAFINIL 100 MG PO TABS: 100.0000 mg | ORAL_TABLET | Freq: Every day | ORAL | Status: DC

## 2014-11-18 NOTE — Progress Notes (Addendum)
History of Present Illness:   Mr. Worlds, 44 year-old black male returns today for followup of RRMS.  He was seen by his GNA since 2000, initially presenting with facial numbness, MRI demonstrates lesions consistent with MS, however he has lost followups, was suggested Copaxone in 2004, but never followed through, he was reevaluated by Dr. Ron Parker in February of 2008, he began to have bladder incontinence, dizziness, blurry vision, unbalanced gait, repeat MRI at that time continued to demonstrate multiple foci of signal hyperintensity, involving brainstem, superior peduncle, deep white matters, there was pericallosal involvement, no enhancement, but there was positive DWIs lesion, he was started on Avenox since 06/13/2007,  he continued to have flulike illness following injection,  He also reported a history of Crohn's disease, last treatment was in 2004 with Ascol, he no longer has significant GI symptoms, not on any immunosuppressive medications.  He was treated with immunosuppressive treatment mercaptopurine in late 1990s, for 3-4 months, still had diahrrea.  He had one to 2 flareups each year,  presenting with increased dizziness, unbalanced gait, blurry vision, worsening memory trouble, often partial improvement with steroid package.  Most recent MRI cervical was in July 2011, remote demyelinating plaque at C2 and C4, no enhancing lesions.  Repeat MRI brain and cervical in Jan 2013 showed a defined hyperintensity in the spinal cord at C2 which likely represents remote age inactive demyelinating plaque. No enhancing lesions are noted. As compared with previous MRI dated 04/30/2010 the C4 spinal cord hyperintensity is no longer seen.  MRI scan of the brain showed multiple brainstem, cerebellar, corpus callosal and periventricular white matter hyperintensities consistent with multiple sclerosis. No enhancing lesions are noted. Presence of T1 black holes  andd corpus callosal atrophy indicate chronic  disease. Overall no significant changes compared with MRI from 01/23/2007   He is doing very well, there was no new neurological symptoms, he is taking Avonex, mild flulike illness right out of the injection, improved by Tylenol, or aleve.  He complains of frequent bilateral frontal headaches.  We have reviewed MRI of the brain, which has demonstrated multiple periventricular and subcortical and infratentorial chronic demyelinating plaques. No acute plaques. Compared to prior MRI on 01/23/07, there are has been disease progression.  He continues to do very well, reported prior to MRI scan, he has been off of his Avonex for 4 months, due to insurance reasons, after discussed with Mr. Oyen we decided to stay on current Avonex treatment, overall only slight change in his MRI scan there is no significant clinical worsening, we will repeat MRI of the brain and cervical spine with and without contrast in one year.  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 Carlean Purl, he has been treated with prednisone, mercaptopurine,( which is a nucleoside metabolic inhibitor that results in cell-cycle arrest and death) and mesalamine in the past,   We decided not to proceed with [name],  Keep him on Avonex he complains of 3 weeks history of left arm numbness, since January 2015, has much improved now,    UPDATE 07/28/2014: He had laparoscopic surgery in Sept 3rd 2015 at [facility], which did help him,   He has more memory loss. He also complains of fatigue. No recurrent mass symptoms, he denied gait difficulty, no visual disorder  We have reviewed MRI of brain:  scattered bilateral periventricular, subcortical, corpus callosum, cerebellar and brainstem white matter hyperintensities typical for  demyelinating disease. No enhancing lesions are noted. The presence of T1 black holes and mild atrophy of corpus callosum indicates chronic disease. Compared with previous MRI scan dated 04/22/13 there appears to be slight disease activity progression   MRI scan of the cervical spine showing left lateral disc herniation at C5-6 with significant narrowing of the foramina. Spinal cord hyperintensities at C2, C4-5 and in the brainstem and cerebellum likely represent remote age demyelinating plaques. No enhancing lesions are noted.  UPDATE Jan 26th 2016: He has short term memory loss, no gait difficulty, no vision.   Review of system:Out of a complete 14 system review, the patient complains of as above  Physical Exam  General: well developed, well  nourished, seated, in no evident distress Head: head  normocephalic and atraumatic.  Oropharynx benign. Neck: supple no carotid bruits Respiratory: clear to auscultation bilaterally Cardiovascular: regular rate rhythm  Neurologic Exam  Mental Status: pleasant, awake, alert, cooperative to history, talking, and casual conversation. Cranial Nerves: CN II-XII pupils were equal round reactive to light.  Fundi were sharp bilaterally.  Extraocular movements were full.  Visual fields were full on confrontational test.  Facial sensation and strength were normal.  Hearing was intact to finger rubbing bilaterally.  Uvula tongue were midline.  Head turning and shoulder shrugging were normal and symmetric.  Tongue protrusion into the cheeks strength were normal.  Motor: mild lower extremity spasticity, no weakness. Sensory: Normal to light touch, pinprick, proprioception, and vibratory sensation. Coordination: mild finger to nose and heel to shin dysmetria. Gait and Station: wide based and mildly unsteady, mild difficulty to perform tiptoe, heel, and moderate difficulty with tandem walking.  Romberg sign: positive. mild trunk ataxia Reflexes: Deep tendon reflexes:  Biceps: 2/2, Brachioradialis: 2/2, Triceps: 2/2, Pateller: 2+/2+, Achilles: 1/1.  Plantar responses are flexor.   Assessment and Plan: 44 years old right handed [place] American male, with history of relapsing remitting multiple sclerosis, Crohn's disease, mild unsteady gait,dizziness, memory loss, he has been on Avonex since August 2008, flulike illness with Avonex injection.  there was slight progression on the repeat MRI in 2015 compared to 2014,  Recent colonoscopy showed inflammatory constriction, he has to go through laparoscopic surgery for adhesion.  He is JC antibody positive, also had previous exposure to  mercaptopurine,  Based on previous dicussion we decided to continue Avonex for his relapsing remitting multiple sclerosis, he is to continue followup with his GI Dr. Carlean Purl for the treatment choice for his Crohn's disease.  RTC in 6 months with nurse practitioner  Repeat  MRI brain w/wo in  July 2016,  Lab today  Provigil daily  Orders Placed This Encounter  Procedures  . Stratify JCV Antibody Test ([facility])  . Varicella zoster antibody, IgG  . Comprehensive metabolic panel  . CBC With Differential      There are no discontinued medications.   Return in about 6 months (around 05/19/2015).  Marcial Pacas, M.D. Ph.D.  [facility] [place], [place] [address] Phone: [phone] Fax:      [phone]

## 2014-11-19 ENCOUNTER — Telehealth: Payer: Self-pay | Admitting: Neurology

## 2014-11-19 LAB — COMPREHENSIVE METABOLIC PANEL
A/G RATIO: 1.6 (ref 1.1–2.5)
ALT: 15 IU/L (ref 0–44)
AST: 19 IU/L (ref 0–40)
Albumin: 4.4 g/dL (ref 3.5–5.5)
Alkaline Phosphatase: 64 IU/L (ref 39–117)
BUN/Creatinine Ratio: 10 (ref 9–20)
BUN: 10 mg/dL (ref 6–24)
CHLORIDE: 101 mmol/L (ref 97–108)
CO2: 24 mmol/L (ref 18–29)
CREATININE: 1.02 mg/dL (ref 0.76–1.27)
Calcium: 8.8 mg/dL (ref 8.7–10.2)
GFR calc non Af Amer: 89 mL/min/{1.73_m2} (ref >59)
GFR, EST AFRICAN AMERICAN: 103 mL/min/{1.73_m2} (ref >59)
Globulin, Total: 2.8 g/dL (ref 1.5–4.5)
Glucose: 85 mg/dL (ref 65–99)
POTASSIUM: 4.3 mmol/L (ref 3.5–5.2)
Sodium: 139 mmol/L (ref 134–144)
TOTAL PROTEIN: 7.2 g/dL (ref 6.0–8.5)
Total Bilirubin: 1.1 mg/dL (ref 0.0–1.2)

## 2014-11-19 LAB — CBC WITH DIFFERENTIAL
BASOS: 1 %
Basophils Absolute: 0 10*3/uL (ref 0.0–0.2)
Eos: 3 %
Eosinophils Absolute: 0.2 10*3/uL (ref 0.0–0.4)
HEMATOCRIT: 35.2 % — AB (ref 37.5–51.0)
HEMOGLOBIN: 11.6 g/dL — AB (ref 12.6–17.7)
Immature Grans (Abs): 0 10*3/uL (ref 0.0–0.1)
Immature Granulocytes: 0 %
Lymphocytes Absolute: 1.5 10*3/uL (ref 0.7–3.1)
Lymphs: 28 %
MCH: 25.4 pg — ABNORMAL LOW (ref 26.6–33.0)
MCHC: 33 g/dL (ref 31.5–35.7)
MCV: 77 fL — ABNORMAL LOW (ref 79–97)
Monocytes Absolute: 0.5 10*3/uL (ref 0.1–0.9)
Monocytes: 10 %
NEUTROS PCT: 58 %
Neutrophils Absolute: 3.2 10*3/uL (ref 1.4–7.0)
RBC: 4.57 x10E6/uL (ref 4.14–5.80)
RDW: 15.5 % — AB (ref 12.3–15.4)
WBC: 5.4 10*3/uL (ref 3.4–10.8)

## 2014-11-19 LAB — VARICELLA ZOSTER ANTIBODY, IGG: Varicella zoster IgG: 739 index (ref >165)

## 2014-11-19 NOTE — Telephone Encounter (Signed)
Jonathon Vazquez: Please call patient, laboratory showed mild anemia, hemoglobin 11.6, this could be related to his GI procedures, other laboratory evaluation was normal

## 2014-11-19 NOTE — Telephone Encounter (Signed)
Patient aware of results - encouraged an iron rich diet.  He also has a follow up with his GI doctor.

## 2014-12-08 ENCOUNTER — Ambulatory Visit (AMBULATORY_SURGERY_CENTER): Payer: Self-pay | Admitting: *Deleted

## 2014-12-08 VITALS — Ht 71.0 in | Wt 188.8 lb

## 2014-12-08 DIAGNOSIS — K5669 Other intestinal obstruction: Secondary | ICD-10-CM

## 2014-12-08 DIAGNOSIS — K50118 Crohn's disease of large intestine with other complication: Secondary | ICD-10-CM

## 2014-12-08 DIAGNOSIS — K56699 Other intestinal obstruction unspecified as to partial versus complete obstruction: Secondary | ICD-10-CM

## 2014-12-08 NOTE — Progress Notes (Signed)
No egg or soy allergy No diet pills] No home 02 use  No issues with past sedation Pt declined emmi video Pt  And wife in PV today , instructions complete with both and questions answered. encouraged to call with questions or issues.

## 2015-01-06 ENCOUNTER — Encounter: Payer: Medicare Other | Admitting: Internal Medicine

## 2015-01-08 ENCOUNTER — Encounter: Payer: Medicare Other | Admitting: Internal Medicine

## 2015-01-14 ENCOUNTER — Other Ambulatory Visit: Payer: Self-pay | Admitting: Neurology

## 2015-02-02 ENCOUNTER — Telehealth: Payer: Self-pay | Admitting: *Deleted

## 2015-02-02 NOTE — Telephone Encounter (Signed)
Opened in error

## 2015-02-27 ENCOUNTER — Ambulatory Visit (AMBULATORY_SURGERY_CENTER): Payer: Self-pay

## 2015-02-27 VITALS — Ht 71.0 in | Wt 193.2 lb

## 2015-02-27 DIAGNOSIS — K50119 Crohn's disease of large intestine with unspecified complications: Secondary | ICD-10-CM

## 2015-02-27 NOTE — Progress Notes (Signed)
Per pt, no allergies to soy or egg products.Pt not taking any weight loss meds or using  O2 at home. 

## 2015-03-13 ENCOUNTER — Ambulatory Visit (AMBULATORY_SURGERY_CENTER): Payer: Medicare Other | Admitting: Internal Medicine

## 2015-03-13 ENCOUNTER — Encounter: Payer: Self-pay | Admitting: Internal Medicine

## 2015-03-13 ENCOUNTER — Other Ambulatory Visit: Payer: Self-pay | Admitting: Internal Medicine

## 2015-03-13 VITALS — BP 110/57 | HR 50 | Temp 97.2°F | Resp 24 | Ht 71.0 in | Wt 188.0 lb

## 2015-03-13 DIAGNOSIS — K6389 Other specified diseases of intestine: Secondary | ICD-10-CM | POA: Diagnosis not present

## 2015-03-13 DIAGNOSIS — K50119 Crohn's disease of large intestine with unspecified complications: Secondary | ICD-10-CM

## 2015-03-13 DIAGNOSIS — K50118 Crohn's disease of large intestine with other complication: Secondary | ICD-10-CM

## 2015-03-13 DIAGNOSIS — K9189 Other postprocedural complications and disorders of digestive system: Secondary | ICD-10-CM | POA: Diagnosis not present

## 2015-03-13 MED ORDER — SODIUM CHLORIDE 0.9 % IV SOLN: 500.0000 mL | INTRAVENOUS | Status: DC

## 2015-03-13 NOTE — Assessment & Plan Note (Signed)
Lialda not used due to side effect of diarrhea

## 2015-03-13 NOTE — Progress Notes (Signed)
To Pacu, awake and alert, please with MAC, Report to RN.

## 2015-03-13 NOTE — Op Note (Signed)
[facility]  [address] [place], [address]   COLONOSCOPY PROCEDURE REPORT  PATIENT: Jonathon Vazquez, Jonathon Vazquez  MR#: [id number] BIRTHDATE: January 26, 1971 , 72  yrs. old GENDER: male ENDOSCOPIST: Gatha Mayer, MD, [facility] PROCEDURE DATE:  03/13/2015 PROCEDURE:   Colonoscopy, diagnostic and Colonoscopy with biopsy First Screening Colonoscopy - Avg.  risk and is 50 yrs.  old or older - No.  Prior Negative Screening - Now for repeat screening. N/A  History of Adenoma - Now for follow-up colonoscopy & has been > or = to 3 yrs.  N/A  Polyps removed today? No Recommend repeat exam, <10 yrs? Yes Crohn's ASA CLASS:   Class II INDICATIONS:Inflammatory bowel disease of the intestine if more precise diagnosis or determination of the extent / severity of activity of disease will influence immediate / future management and Patient is not applicable for Colorectal Neoplasm Risk Assessment for this procedure. MEDICATIONS: Propofol 200 mg IV and Monitored anesthesia care  DESCRIPTION OF PROCEDURE:   After the risks benefits and alternatives of the procedure were thoroughly explained, informed consent was obtained.  The digital rectal exam revealed no abnormalities of the rectum, revealed the prostate was not enlarged, and revealed no prostatic nodules.   The [id number]  endoscope was introduced through the anus and advanced to the terminal ileum which was intubated for a short distance. No adverse events experienced.   The quality of the prep was excellent.  (MiraLax was used)  The instrument was then slowly withdrawn as the colon was fully examined. Estimated blood loss is zero unless otherwise noted in this procedure report.  COLON FINDINGS: 1) Mild inflammatory change in terminal ileum - red spots and friable - biopsies taken 2) focal stricture in cecum - biopsied  3) normal left colon anastomosis, s/p segmental stricture resection - slightly ulcerated, biopsied 4) otherwise  normal colon. Retroflexed views revealed no abnormalities. The time to cecum = 1.3 Withdrawal time = 11.6   The scope was withdrawn and the procedure completed. COMPLICATIONS: There were no immediate complications.  ENDOSCOPIC IMPRESSION: 1) Mild inflammatory change in terminal ileum - red spots and friable - biopsies taken 2) focal stricture in cecum - biopsied 3) normal left colon anastomosis - slightly ulcerated, biopsied 4) otherwise normal colon  RECOMMENDATIONS: Office will call with the results.  eSigned:  Gatha Mayer, MD, [facility] 03/13/2015 9:05 AM   cc: The Patient

## 2015-03-13 NOTE — Progress Notes (Signed)
Called to room to assist during endoscopic procedure.  Patient ID and intended procedure confirmed with present staff. Received instructions for my participation in the procedure from the performing physician.  

## 2015-03-13 NOTE — Patient Instructions (Addendum)
There appeared to be slight inflammation in the end of the small intestine, a mild stricture in the beginning of the colon and a normal anastomosis (connection made at surgery). Everything else looked ok.  I will contact you with results and recommendations.  I appreciate the opportunity to care for you. Gatha Mayer, MD, FACG YOU HAD AN ENDOSCOPIC PROCEDURE TODAY AT [facility]:   Refer to the procedure report that was given to you for any specific questions about what was found during the examination.  If the procedure report does not answer your questions, please call your gastroenterologist to clarify.  If you requested that your care partner not be given the details of your procedure findings, then the procedure report has been included in a sealed envelope for you to review at your convenience later.  YOU SHOULD EXPECT: Some feelings of bloating in the abdomen. Passage of more gas than usual.  Walking can help get rid of the air that was put into your GI tract during the procedure and reduce the bloating. If you had a lower endoscopy (such as a colonoscopy or flexible sigmoidoscopy) you may notice spotting of blood in your stool or on the toilet paper. If you underwent a bowel prep for your procedure, you may not have a normal bowel movement for a few days.  Please Note:  You might notice some irritation and congestion in your nose or some drainage.  This is from the oxygen used during your procedure.  There is no need for concern and it should clear up in a day or so.  SYMPTOMS TO REPORT IMMEDIATELY:   Following lower endoscopy (colonoscopy or flexible sigmoidoscopy):  Excessive amounts of blood in the stool  Significant tenderness or worsening of abdominal pains  Swelling of the abdomen that is new, acute  Fever of 100F or higher    For urgent or emergent issues, a gastroenterologist can be reached at any hour by calling [phone].   DIET: Your  first meal following the procedure should be a small meal and then it is ok to progress to your normal diet. Heavy or fried foods are harder to digest and may make you feel nauseous or bloated.  Likewise, meals heavy in dairy and vegetables can increase bloating.  Drink plenty of fluids but you should avoid alcoholic beverages for 24 hours.  ACTIVITY:  You should plan to take it easy for the rest of today and you should NOT DRIVE or use heavy machinery until tomorrow (because of the sedation medicines used during the test).    FOLLOW UP: Our staff will call the number listed on your records the next business day following your procedure to check on you and address any questions or concerns that you may have regarding the information given to you following your procedure. If we do not reach you, we will leave a message.  However, if you are feeling well and you are not experiencing any problems, there is no need to return our call.  We will assume that you have returned to your regular daily activities without incident.  If any biopsies were taken you will be contacted by phone or by letter within the next 1-3 weeks.  Please call us at [phone] if you have not heard about the biopsies in 3 weeks.    SIGNATURES/CONFIDENTIALITY: You and/or your care partner have signed paperwork which will be entered into your electronic medical record.  These signatures attest to  the fact that that the information above on your After Visit Summary has been reviewed and is understood.  Full responsibility of the confidentiality of this discharge information lies with you and/or your care-partner.

## 2015-03-16 ENCOUNTER — Telehealth: Payer: Self-pay | Admitting: *Deleted

## 2015-03-16 NOTE — Telephone Encounter (Signed)
  Follow up Call-  Call back number 03/13/2015 08/01/2013  Post procedure Call Back phone  # [phone]  Permission to leave phone message Yes Yes     Patient questions:  Do you have a fever, pain , or abdominal swelling? No. Pain Score  0 *  Have you tolerated food without any problems? Yes.    Have you been able to return to your normal activities? Yes.    Do you have any questions about your discharge instructions: Diet   No. Medications  No. Follow up visit  No.  Do you have questions or concerns about your Care? Yes.    Actions: * If pain score is 4 or above: No action needed, pain <4.

## 2015-03-25 NOTE — Progress Notes (Signed)
Quick Note:  Please tell him only the anastomosis biopsies showed some inflammation - nothing conclusive for active Crohn's disease Ask him to schedule a non-urgent f/u this summer to discuss his situation re: prophylactic treatment   [facility] - no letter Recall colonoscopy 1 year please ______

## 2015-04-16 ENCOUNTER — Telehealth: Payer: Self-pay | Admitting: *Deleted

## 2015-04-16 DIAGNOSIS — G35 Multiple sclerosis: Secondary | ICD-10-CM

## 2015-04-16 NOTE — Addendum Note (Signed)
Addended by: Marcial Pacas on: 04/16/2015 12:23 PM   Modules accepted: Orders

## 2015-04-16 NOTE — Telephone Encounter (Addendum)
Reminder - patient needs repeat MRI Brain ordered prior to his MS follow up appointment on 05/20/15.  MRI brain w/wo order placed. Marcial Pacas, M.D. Ph.D.  [facility] [place], [place] [address] Phone: [phone] Fax:      [phone]

## 2015-04-28 ENCOUNTER — Ambulatory Visit
Admission: RE | Admit: 2015-04-28 | Discharge: 2015-04-28 | Disposition: A | Discharge status: Home / Self Care | Payer: Medicare (Managed Care) | Source: Ambulatory Visit | Attending: Neurology | Admitting: Neurology

## 2015-04-28 DIAGNOSIS — G35 Multiple sclerosis: Secondary | ICD-10-CM

## 2015-04-28 MED ORDER — GADOBENATE DIMEGLUMINE 529 MG/ML IV SOLN
19.0000 mL | Freq: Once | INTRAVENOUS | Status: AC | PRN
  Administered 2015-04-28: 19 mL via INTRAVENOUS

## 2015-05-11 ENCOUNTER — Encounter: Payer: Self-pay | Admitting: Physician Assistant

## 2015-05-11 ENCOUNTER — Ambulatory Visit (INDEPENDENT_AMBULATORY_CARE_PROVIDER_SITE_OTHER): Payer: Medicare Other | Admitting: Physician Assistant

## 2015-05-11 VITALS — BP 100/80 | HR 70 | Temp 98.1°F | Resp 18 | Wt 192.0 lb

## 2015-05-11 DIAGNOSIS — G4700 Insomnia, unspecified: Secondary | ICD-10-CM | POA: Diagnosis not present

## 2015-05-11 DIAGNOSIS — L0291 Cutaneous abscess, unspecified: Secondary | ICD-10-CM | POA: Diagnosis not present

## 2015-05-11 DIAGNOSIS — L0390 Cellulitis, unspecified: Secondary | ICD-10-CM

## 2015-05-11 MED ORDER — ZOLPIDEM TARTRATE 10 MG PO TABS: 10.0000 mg | ORAL_TABLET | Freq: Every evening | ORAL | Status: DC | PRN

## 2015-05-11 MED ORDER — SULFAMETHOXAZOLE-TRIMETHOPRIM 800-160 MG PO TABS: 1.0000 | ORAL_TABLET | Freq: Two times a day (BID) | ORAL | Status: DC

## 2015-05-11 NOTE — Progress Notes (Signed)
Patient ID: Jonathon Vazquez MRN: [id number], DOB: 12-03-1970, 44 y.o. Date of Encounter: 05/11/2015, 5:03 PM    Chief Complaint:  Chief Complaint  Patient presents with  . boils    on mid back, wife checked it out and wanted pt to come to have it checked out     HPI: 44 y.o. year old AA male presents with above. States that he initially noticed this bump form on his back last Wednesday which was 05/06/15. At that point it was a very small bump. After that it gradually worsened. Saturday 05/09/15 it burst and drainage came from it and his wife applied bandage.     Home Meds:   Outpatient Prescriptions Prior to Visit  Medication Sig Dispense Refill  . acetaminophen (TYLENOL) 325 MG tablet Take 650 mg by mouth every 6 (six) hours as needed.    . AVONEX PEN 30 MCG/0.5ML AJKT Inject 30 mcg Intramuscularly once a week Rotate injection site 1 each 6  . AVONEX PEN 30 MCG/0.5ML injection Inject 30 mcg Intramuscularly once a week Rotate injection site 1 kit 11  . modafinil (PROVIGIL) 100 MG tablet Take 1 tablet (100 mg total) by mouth daily. 60 tablet 5   No facility-administered medications prior to visit.    Allergies:  Allergies  Allergen Reactions  . Lialda [Mesalamine] Diarrhea    Severe diarrhea      Review of Systems: See HPI for pertinent ROS. All other ROS negative.    Physical Exam: Blood pressure 100/80, pulse 70, temperature 98.1 F (36.7 C), temperature source Oral, resp. rate 18, weight 192 lb (87.091 kg)., Body mass index is 26.79 kg/(m^2). General:  WNWD AAM. Appears in no acute distress. Neck: Supple. No thyromegaly. No lymphadenopathy. Lungs: Clear bilaterally to auscultation without wheezes, rales, or rhonchi. Breathing is unlabored. Heart: Regular rhythm. No murmurs, rubs, or gallops. Msk:  Strength and tone normal for age. Skin: Upper Back: 7 mm diameter area---open wound, there was small area of golden color drainage present on the side (mucus-consistency  clump)--o/w site clean with no purulent drainage. I even firmly palpated site and no further drainage present. There is no erythema around the site.  Neuro: Alert and oriented X 3. Moves all extremities spontaneously. Gait is normal. CNII-XII grossly in tact. Psych:  Responds to questions appropriately with a normal affect.     ASSESSMENT AND PLAN:  44 y.o. year old male with  1. Cellulitis and abscess - Culture, routine-abscess - sulfamethoxazole-trimethoprim (BACTRIM DS,SEPTRA DS) 800-160 MG per tablet; Take 1 tablet by mouth 2 (two) times daily.  Dispense: 20 tablet; Refill: 0  Culture obtained and sent. Packing placed. Bandage applied over the packing. Patient states that his wife has something that she can put over top of the site to keep it dry when he showers. He will keep this clean and dry for several days then have her remove the packing.(Wife is Therapist, sports).   Follow up if site worsens or does not return to normal with completion of antibiotic. He is to start the antibiotic now, take as directed, and complete all of it.  2. Insomnia Patient states that he has a trip coming up in September that is going to be a 14 hour plane ride. Asking if he could have some type of medication to help him sit still and be relaxed. Discussed benzodiazepine medication versus a medication to help him sleep. Agrees that a medication to help him sleep would actually be better. Says that he  has had 8 hour plane ride in the past and would fall asleep for just a few minutes then wake up, then fall  asleep a few minutes and wake up. Told him to try taking this medication at home when he has 8 hours to sleep to know how the medication affects him prior to that trip. - zolpidem (AMBIEN) 10 MG tablet; Take 1 tablet (10 mg total) by mouth at bedtime as needed for sleep.  Dispense: 30 tablet; Refill: 0   Signed, [address] [place], [place], [facility] 05/11/2015 5:03 PM

## 2015-05-14 ENCOUNTER — Encounter: Payer: Self-pay | Admitting: Internal Medicine

## 2015-05-14 ENCOUNTER — Ambulatory Visit (INDEPENDENT_AMBULATORY_CARE_PROVIDER_SITE_OTHER): Payer: Medicare Other | Admitting: Internal Medicine

## 2015-05-14 VITALS — BP 102/60 | HR 82 | Ht 71.0 in | Wt 194.0 lb

## 2015-05-14 DIAGNOSIS — K5010 Crohn's disease of large intestine without complications: Secondary | ICD-10-CM | POA: Diagnosis not present

## 2015-05-14 MED ORDER — MESALAMINE ER 0.375 G PO CP24: 1500.0000 mg | ORAL_CAPSULE | Freq: Every day | ORAL | Status: DC

## 2015-05-14 NOTE — Patient Instructions (Signed)
   We have sent the following medications to your pharmacy for you to pick up at your convenience: Apriso   Call us if you get diarrhea.    Follow up with [place] in a year or sooner if needed.    I appreciate the opportunity to care for you. Silvano Rusk, MD, [facility]

## 2015-05-14 NOTE — Assessment & Plan Note (Signed)
OK Try Apriso for prophylaxis

## 2015-05-14 NOTE — Progress Notes (Signed)
   Subjective:    Patient ID: Jonathon Vazquez, male    DOB: Mar 04, 1971, 44 y.o.   MRN: [id number] Cc: Crohn's colitis f/u HPI Doing well - had diarrhea from Lialda No c/o  Review of Systems Going on a mission trip to [place] in early Sept    Objective:   Physical Exam BP 102/60 mmHg  Pulse 82  Ht 5' 11"  (1.803 m)  Wt 194 lb (87.998 kg)  BMI 27.07 kg/m2 Healthy NAD       Assessment & Plan:  Crohn's colitis- hx strictures and resection OK Try Apriso for prophylaxis   RTC 1 year sooner prn

## 2015-05-15 ENCOUNTER — Encounter: Payer: Self-pay | Admitting: Internal Medicine

## 2015-05-15 LAB — CULTURE, ROUTINE-ABSCESS
GRAM STAIN: NONE SEEN
GRAM STAIN: NONE SEEN
Organism ID, Bacteria: NO GROWTH

## 2015-05-20 ENCOUNTER — Encounter: Payer: Self-pay | Admitting: Neurology

## 2015-05-20 ENCOUNTER — Ambulatory Visit (INDEPENDENT_AMBULATORY_CARE_PROVIDER_SITE_OTHER): Payer: Medicare Other | Admitting: Neurology

## 2015-05-20 VITALS — BP 114/62 | HR 70 | Ht 71.0 in | Wt 193.0 lb

## 2015-05-20 DIAGNOSIS — K50118 Crohn's disease of large intestine with other complication: Secondary | ICD-10-CM

## 2015-05-20 DIAGNOSIS — G35 Multiple sclerosis: Secondary | ICD-10-CM

## 2015-05-20 MED ORDER — MODAFINIL 100 MG PO TABS: 100.0000 mg | ORAL_TABLET | Freq: Two times a day (BID) | ORAL | Status: DC

## 2015-05-20 NOTE — Progress Notes (Signed)
PATIENT: Jonathon Vazquez DOB: 1971/09/08  Chief Complaint  Patient presents with  . Multiple Sclerosis    Feels he is doing well overall.  He is still using Avonex once weekly and he usually feels bad for 1-2 days after his injection.  He premedicates with Tylenol which is helpful.  He would like to discuss his MRI.  He is going on a mission trip to [place] in September for ten days.    HISTORICAL  Jonathon Vazquez   History of Present Illness:   Jonathon Vazquez, 44 year-old black male returns today for followup of RRMS.  He was seen by his [facility] since 2000, initially presenting with facial numbness, MRI demonstrates lesions consistent with MS, however he has lost followups, was suggested Copaxone in 2004, but never followed through, he was reevaluated by Dr. Ron Parker in February of 2008, he began to have bladder incontinence, dizziness, blurry vision, unbalanced gait, repeat MRI at that time continued to demonstrate multiple foci of signal hyperintensity, involving brainstem, superior peduncle, deep white matters, there was pericallosal involvement, no enhancement, but there was positive DWIs lesion, he was started on Avenox since 06/13/2007,  he continued to have flulike illness following injection,  He also reported a history of Crohn's disease, last treatment was in 2004 with Ascol, he no longer has significant GI symptoms, not on any immunosuppressive medications.  He was treated with immunosuppressive treatment mercaptopurine in late 1990s, for 3-4 months, still had diahrrea.  He had one to 2 flareups each year,  presenting with increased dizziness, unbalanced gait, blurry vision, worsening memory trouble, often partial improvement with steroid package.  Most recent MRI cervical was in July 2011, remote demyelinating plaque at C2 and C4, no enhancing lesions.  Repeat MRI brain and cervical in Jan 2013 showed a defined hyperintensity in the spinal cord at C2 which likely represents remote age  inactive demyelinating plaque. No enhancing lesions are noted. As compared with previous MRI dated 04/30/2010 the C4 spinal cord hyperintensity is no longer seen.  MRI scan of the brain showed multiple brainstem, cerebellar, corpus callosal and periventricular white matter hyperintensities consistent with multiple sclerosis. No enhancing lesions are noted. Presence of T1 black holes  andd corpus callosal atrophy indicate chronic disease. Overall no significant changes compared with MRI from 01/23/2007   He is doing very well, there was no new neurological symptoms, he is taking Avonex, mild flulike illness right out of the injection, improved by Tylenol, or aleve.  He complains of frequent bilateral frontal headaches.  We have reviewed MRI of the brain, which has demonstrated multiple periventricular and subcortical and infratentorial chronic demyelinating plaques. No acute plaques. Compared to prior MRI on 01/23/07, there are has been disease progression.  He continues to do very well, reported prior to MRI scan, he has been off of his Avonex for 4 months, due to insurance reasons, after discussed with Jonathon Vazquez we decided to stay on current Avonex treatment, overall only slight change in his MRI scan there is no significant clinical worsening, we will repeat MRI of the brain and cervical spine with and without contrast in one year.  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 Carlean Purl, he has been treated with prednisone, mercaptopurine,( which is a nucleoside metabolic inhibitor that results in  cell-cycle arrest and death) and mesalamine in the past,   We decided not to proceed with Jonathon Vazquez,  Keep him on Avonex he complains of 3 weeks history of left arm numbness, since  January 2015, has much improved now,    UPDATE 07/28/2014: He had laparoscopic surgery in Sept 3rd 2015 at [facility], which did help him,   He has more memory loss. He also complains of fatigue. No recurrent mass symptoms, he denied gait difficulty, no visual disorder  We have reviewed MRI of brain: scattered bilateral periventricular, subcortical, corpus callosum, cerebellar and brainstem white matter hyperintensities typical for demyelinating disease. No enhancing lesions are noted. The presence of T1 black holes and mild atrophy of corpus callosum indicates chronic disease. Compared with previous MRI scan dated 04/22/13 there appears to be slight disease activity progression   MRI scan of the cervical spine showing left lateral disc herniation at C5-6 with significant narrowing of the foramina. Spinal cord hyperintensities at C2, C4-5 and in the brainstem and cerebellum likely represent remote age demyelinating plaques. No enhancing lesions are noted.  UPDATE Jan 26th 2016: He complains of increased short term memory loss, no significant gait difficulty, no vision loss does complains of fatigue, especially with heat exposure We have reviewed most recent MRI of the brain in July 2016 with without contrast, in comparison to MRI of brain in 2008, and 2015, multiple rounded and ovoid, periventricular, subcortical, juxtacortical and infratentorial chronic demyelinating plaque, some T1 black holes, no abnormal enhancement, no change compared to previous scans  REVIEW OF SYSTEMS: Full 14 system review of systems performed and notable only for fatigue, swelling in legs, shortness of breath, cough, wheezing, anemia, increased thirst, joint pain, cramps, aching muscles, frequent infections, memory loss, confusion, headaches, tremor, insomnia, sleepiness, depression, anxiety, decreased energy, racing thoughts.  ALLERGIES: Allergies  Allergen Reactions  . Lialda [Mesalamine] Diarrhea    Severe diarrhea     HOME MEDICATIONS: Current Outpatient Prescriptions  Medication Sig Dispense Refill  . acetaminophen (TYLENOL) 325 MG tablet Take 650 mg by mouth every 6 (six) hours as needed.    Marland Kitchen atovaquone-proguanil (MALARONE) 250-100 MG TABS TAKE 1 TABLET DAILY STARTING THE DAY BEFORE TRAVEL TO MALARIOUS AREA,EACH DAY & 7 DAYS AFTER LEAVING  0  . AVONEX PEN 30 MCG/0.5ML AJKT Inject 30 mcg Intramuscularly once a week Rotate injection site 1 each 6  . ciprofloxacin (CIPRO) 500 MG tablet 1 TABLET TWICE DAILY ORAL FOR 5 DAYS TAKE 1 TABLET EVERY 12 HOURS AS NEEDED FOR TRAVELER'S DIARRHEA.  0  . mesalamine (APRISO) 0.375 G 24 hr capsule Take 4 capsules (1.5 g total) by mouth daily. 120 capsule 11  . modafinil (PROVIGIL) 100 MG tablet Take 1 tablet (100 mg total) by mouth 2 (two) times daily. 60 tablet 5  . sulfamethoxazole-trimethoprim (BACTRIM DS,SEPTRA DS) 800-160 MG per tablet Take 1 tablet by mouth 2 (two) times daily. 20 tablet 0  . zolpidem (AMBIEN) 10 MG tablet Take 1 tablet (10 mg total) by mouth at bedtime as needed for sleep. 30 tablet 0   No current facility-administered medications for this visit.    PAST MEDICAL HISTORY: Past Medical History  Diagnosis Date  . Crohn's disease   . Patent ductus arteriosus   . Atrial fibrillation     pt and wife deny this  . Multiple sclerosis   . Colon stricture     descending colon  . Anal fistula 1996  . Anal stenosis   . Regional enteritis of large  intestine   . S/P left hemicolectomy 2015    [facility]-done for stricture  . 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 Neuromuscular disorder     PAST SURGICAL HISTORY: Past Surgical History  Procedure Laterality Date  . Colonoscopy      multiple  . Colon surgery  2015    descending colon stricture     FAMILY HISTORY: Family History  Problem Relation Age of Onset  . Prostate cancer Paternal Grandfather   . Diabetes Paternal Grandmother   . Colon cancer Neg Hx   . Rectal  cancer Neg Hx   . Stomach cancer Neg Hx   . Ulcerative colitis Father   . Heart disease Mother     SOCIAL HISTORY:  History   Social History  . Marital Status: Married    Spouse Name: [name]  . Number of Children: 3  . Years of Education: 13   Occupational History  . disabled    Social History Main Topics  . Smoking status: Former Smoker    Types: Cigarettes    Quit date: 10/24/2004  . Smokeless tobacco: Never Used  . Alcohol Use: No  . Drug Use: No  . Sexual Activity: Not on file   Other Topics Concern  . Not on file   Social History Narrative   Married, 1 son one daughter. 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 Multimedia programmer)   Disabled, previously worked at [employer], disability due to multiple sclerosis   Caffeine one daily   Left handed.   Education- one year of college.     PHYSICAL EXAM   Filed Vitals:   05/20/15 1034  BP: 114/62  Pulse: 70  Height: 5' 11"  (1.803 m)  Weight: 193 lb (87.544 kg)    Not recorded      Body mass index is 26.93 kg/(m^2).  PHYSICAL EXAMNIATION:  Gen: NAD, conversant, well nourised, obese, well groomed                     Cardiovascular: Regular rate rhythm, no peripheral edema, warm, nontender. Eyes: Conjunctivae clear without exudates or hemorrhage Neck: Supple, no carotid bruise. Pulmonary: Clear to auscultation bilaterally   NEUROLOGICAL EXAM:  MENTAL STATUS: Speech:    Speech is normal; fluent and spontaneous with normal comprehension.  Cognition:    The patient is oriented to person, place, and time;     recent and remote memory intact;     language fluent;     normal attention, concentration,     fund of knowledge.  CRANIAL NERVES: CN II: Visual fields are full to confrontation. Fundoscopic exam is normal with sharp discs, pupils were equal round reactive to light CN III, IV, VI: extraocular movement are normal. No ptosis. CN V: Facial sensation is intact to pinprick in all 3 divisions bilaterally. Corneal responses are intact.   CN VII: Face is symmetric with normal eye closure and smile. CN VIII: Hearing is normal to rubbing fingers CN IX, X: Palate elevates symmetrically. Phonation is normal. CN XI: Head turning and shoulder shrug are intact CN XII: Tongue is midline with normal movements and no atrophy.  MOTOR: There is no pronator drift of out-stretched arms. Muscle bulk and tone are normal. Muscle strength is normal.  REFLEXES: Reflexes are 2+ and symmetric at the biceps, triceps, knees, and ankles. Plantar responses are flexor.  SENSORY: Light touch, pinprick, position sense, and vibration sense are intact in fingers and toes.  COORDINATION: Rapid alternating movements and fine finger  movements are intact. There is no dysmetria on finger-to-nose and heel-knee-shin. There are no abnormal or extraneous movements.   GAIT/STANCE:  Narrow based and steady, mild difficulty with tandem walking   DIAGNOSTIC DATA (LABS, IMAGING, TESTING) - I reviewed patient records, labs, notes, testing and imaging myself where available.  Lab Results  Component Value Date   WBC 5.4 11/18/2014   HGB 11.6* 11/18/2014   HCT 35.2* 11/18/2014   MCV 77* 11/18/2014   PLT 200 09/09/2013      Component Value Date/Time   NA 139 11/18/2014 1308   NA 139 05/23/2013 1034   K 4.3 11/18/2014 1308   CL 101 11/18/2014 1308   CO2 24 11/18/2014 1308   GLUCOSE 85 11/18/2014 1308   GLUCOSE 71 05/23/2013 1034   BUN 10 11/18/2014 1308   BUN 10 05/23/2013 1034   CREATININE 1.02 11/18/2014 1308   CALCIUM 8.8 11/18/2014 1308   PROT 7.2 11/18/2014 1308   PROT 7.3 05/23/2013 1034   ALBUMIN 3.6 05/23/2013 1034   AST 19 11/18/2014 1308   ALT 15 11/18/2014 1308   ALKPHOS 64 11/18/2014 1308   BILITOT 1.1 11/18/2014 1308   GFRNONAA 89 11/18/2014 1308   GFRAA 103 11/18/2014 [place] is a 44 y.o. male    Relapsing remitting multiple sclerosis, clinically stable, and MRI showed no significant  change, But he has significant lesion load, mild gait difficulty, positive JC virus, flulike illness with Avonex injection, will consider switching him to by mouth preparations, after his [place] trip in September.    Fatigue: Start provigil 100 mg twice a day   Marcial Pacas, M.D. Ph.D.  [facility] [address], [place], [place] [address] Ph: [phone] Fax: [phone]

## 2015-05-25 ENCOUNTER — Telehealth: Payer: Self-pay | Admitting: *Deleted

## 2015-05-25 NOTE — Telephone Encounter (Signed)
Lab collected on 05/20/15: Final results - negative

## 2015-07-30 ENCOUNTER — Telehealth: Payer: Self-pay | Admitting: Neurology

## 2015-07-30 MED ORDER — INTERFERON BETA-1A 30 MCG/0.5ML IM AJKT: 30.0000 ug | AUTO-INJECTOR | INTRAMUSCULAR | Status: DC

## 2015-07-30 NOTE — Telephone Encounter (Signed)
Rx has been provided.

## 2015-07-30 NOTE — Telephone Encounter (Signed)
[facility] called to request AVONEX PEN 30 MCG/0.5ML AJKT refill. May call [phone].

## 2015-08-20 ENCOUNTER — Ambulatory Visit (INDEPENDENT_AMBULATORY_CARE_PROVIDER_SITE_OTHER): Payer: Medicare Other | Admitting: Neurology

## 2015-08-20 ENCOUNTER — Encounter: Payer: Self-pay | Admitting: Neurology

## 2015-08-20 VITALS — BP 118/72 | HR 72 | Ht 71.0 in | Wt 197.0 lb

## 2015-08-20 DIAGNOSIS — G35 Multiple sclerosis: Secondary | ICD-10-CM

## 2015-08-20 NOTE — Progress Notes (Signed)
PATIENT: Jonathon Vazquez DOB: 1971-06-08  Chief Complaint  Patient presents with  . Multiple Sclerosis    He is back from his trip to [place].  He would like to discuss treatment options for his MS.  He gets flulike symptoms with Avonex.  He is no longer taking modafinil because it made him feel jittery.    HISTORICAL  Jonathon Vazquez 44 year-old black male returns today for followup of RRMS.  He was seen by his [facility] since 2000, initially presenting with facial numbness, MRI demonstrates lesions consistent with MS, however he has lost followups, was suggested Copaxone in 2004, but never followed through, he was reevaluated by Dr. Ron Parker in February of 2008, he began to have bladder incontinence, dizziness, blurry vision, unbalanced gait, repeat MRI at that time continued to demonstrate multiple foci of signal hyperintensity, involving brainstem, superior peduncle, deep white matters, there was pericallosal involvement, no enhancement, but there was positive DWIs lesion, he was started on Avenox since 06/13/2007,  he continued to have flulike illness following injection,  He also reported a history of Crohn's disease, last treatment was in 2004 with Ascol, he no longer has significant GI symptoms, not on any immunosuppressive medications.  He was treated with immunosuppressive treatment mercaptopurine in late 1990s, for 3-4 months, still had diahrrea.  He had one to 2 flareups each year,  presenting with increased dizziness, unbalanced gait, blurry vision, worsening memory trouble, often partial improvement with steroid package.  Most recent MRI cervical was in July 2011, remote demyelinating plaque at C2 and C4, no enhancing lesions.  Repeat MRI brain and cervical in Jan 2013 showed a defined hyperintensity in the spinal cord at C2 which likely represents remote age inactive demyelinating plaque. No enhancing lesions are noted. As compared with previous MRI dated 04/30/2010 the C4 spinal cord  hyperintensity is no longer seen.  MRI scan of the brain showed multiple brainstem, cerebellar, corpus callosal and periventricular white matter hyperintensities consistent with multiple sclerosis. No enhancing lesions are noted. Presence of T1 black holes  andd corpus callosal atrophy indicate chronic disease. Overall no significant changes compared with MRI from 01/23/2007   He is doing very well, there was no new neurological symptoms, he is taking Avonex, mild flulike illness right out of the injection, improved by Tylenol, or aleve.  He complains of frequent bilateral frontal headaches.  We have reviewed MRI of the brain, which has demonstrated multiple periventricular and subcortical and infratentorial chronic demyelinating plaques. No acute plaques. Compared to prior MRI on 01/23/07, there are has been disease progression.  He continues to do very well, reported prior to MRI scan, he has been off of his Avonex for 4 months, due to insurance reasons, after discussed with Mr. Caldera we decided to stay on current Avonex treatment, overall only slight change in his MRI scan there is no significant clinical worsening, we will repeat MRI of the brain and cervical spine with and without contrast in one year.  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 Carlean Purl, he has been treated with prednisone, mercaptopurine,( which is a nucleoside metabolic inhibitor that results in cell-cycle arrest and death) and mesalamine in the past,   We decided not to  proceed with [name],  Keep him on Avonex he complains of 3 weeks history of left arm numbness, since January 2015, has much improved now,    UPDATE  07/28/2014: He had laparoscopic surgery in Sept 3rd 2015 at [facility], which did help him,   He has more memory loss. He also complains of fatigue. No recurrent mass symptoms, he denied gait difficulty, no visual disorder  We have reviewed MRI of brain: scattered bilateral periventricular, subcortical, corpus callosum, cerebellar and brainstem white matter hyperintensities typical for demyelinating disease. No enhancing lesions are noted. The presence of T1 black holes and mild atrophy of corpus callosum indicates chronic disease. Compared with previous MRI scan dated 04/22/13 there appears to be slight disease activity progression   MRI scan of the cervical spine showing left lateral disc herniation at C5-6 with significant narrowing of the foramina. Spinal cord hyperintensities at C2, C4-5 and in the brainstem and cerebellum likely represent remote age demyelinating plaques. No enhancing lesions are noted.  UPDATE Jan 26th 2016: He complains of increased short term memory loss, no significant gait difficulty, no vision loss does complains of fatigue, especially with heat exposure We have reviewed most recent MRI of the brain in July 2016 with without contrast, in comparison to MRI of brain in 2008, and 2015, multiple rounded and ovoid, periventricular, subcortical, juxtacortical and infratentorial chronic demyelinating plaque, some T1 black holes, no abnormal enhancement, no change compared to previous scans  UPDATE Aug 20 2015: He went on missionary trip to [place] in Sep, he is tired of Avonex   REVIEW OF SYSTEMS: Full 14 system review of systems performed and notable only for fatigue, swelling in legs, shortness of breath, cough, wheezing, anemia, increased thirst, joint pain, cramps, aching muscles, frequent infections, memory loss, confusion, headaches, tremor, insomnia, sleepiness, depression, anxiety, decreased energy, racing thoughts.  ALLERGIES: Allergies  Allergen Reactions  . Lialda  [Mesalamine] Diarrhea    Severe diarrhea    HOME MEDICATIONS: Current Outpatient Prescriptions  Medication Sig Dispense Refill  . acetaminophen (TYLENOL) 325 MG tablet Take 650 mg by mouth every 6 (six) hours as needed.    . Interferon Beta-1a (AVONEX PEN) 30 MCG/0.5ML AJKT Inject 30 mcg into the muscle every 7 (seven) days. 1 each 3  . sulfamethoxazole-trimethoprim (BACTRIM DS,SEPTRA DS) 800-160 MG per tablet Take 1 tablet by mouth 2 (two) times daily. 20 tablet 0   No current facility-administered medications for this visit.    PAST MEDICAL HISTORY: Past Medical History  Diagnosis Date  . Crohn's disease ([place])   . Patent ductus arteriosus   . Atrial fibrillation ([place])     pt and wife deny this  . Multiple sclerosis ([place])   . Colon stricture ([facility])     descending colon  . Anal fistula 1996  . Anal stenosis   . Regional enteritis of large intestine ([place])   . S/P left hemicolectomy 2015    [facility]-done for stricture  . 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 Neuromuscular disorder ([place])     PAST SURGICAL HISTORY: Past Surgical History  Procedure Laterality Date  . Colonoscopy      multiple  . Colon surgery  2015    descending colon stricture     FAMILY HISTORY: Family History  Problem Relation Age of Onset  . Prostate cancer Paternal Grandfather   . Diabetes Paternal Grandmother   . Colon cancer Neg Hx   . Rectal cancer Neg Hx   . Stomach cancer  Neg Hx   . Ulcerative colitis Father   . Heart disease Mother     SOCIAL HISTORY:  Social History   Social History  . Marital Status: Married    Spouse Name: [name]  . Number of Children: 3  . Years of Education: 13   Occupational History  . disabled    Social History Main Topics  . Smoking status: Former Smoker    Types: Cigarettes    Quit date: 10/24/2004  . Smokeless tobacco: Never Used  . Alcohol Use: No  . Drug Use: No  . Sexual Activity: Not on file   Other Topics  Concern  . Not on file   Social History Narrative   Married, 1 son one daughter. 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 Multimedia programmer)   Disabled, previously worked at [employer], disability due to multiple sclerosis   Caffeine one daily   Left handed.   Education- one year of college.     PHYSICAL EXAM   Filed Vitals:   08/20/15 1542  BP: 118/72  Pulse: 72  Height: 5' 11"  (1.803 m)  Weight: 197 lb (89.359 kg)    Not recorded      Body mass index is 27.49 kg/(m^2).  PHYSICAL EXAMNIATION:  Gen: NAD, conversant, well nourised, obese, well groomed                     Cardiovascular: Regular rate rhythm, no peripheral edema, warm, nontender. Eyes: Conjunctivae clear without exudates or hemorrhage Neck: Supple, no carotid bruise. Pulmonary: Clear to auscultation bilaterally   NEUROLOGICAL EXAM:  MENTAL STATUS: Speech:    Speech is normal; fluent and spontaneous with normal comprehension.  Cognition:    The patient is oriented to person, place, and time;     recent and remote memory intact;     language fluent;     normal attention, concentration,     fund of knowledge.  CRANIAL NERVES: CN II: Visual fields are full to confrontation. Fundoscopic exam is normal with sharp discs, pupils were equal round reactive to light CN III, IV, VI: extraocular movement are normal. No ptosis. CN V: Facial sensation is intact to pinprick in all 3 divisions bilaterally. Corneal responses are intact.  CN VII: Face is symmetric with normal eye closure and smile. CN VIII: Hearing is normal to rubbing fingers CN IX, X: Palate elevates symmetrically. Phonation is normal. CN XI: Head turning and shoulder shrug are intact CN XII: Tongue is midline with normal movements and no atrophy.  MOTOR: There is no pronator drift of out-stretched arms. Muscle bulk and tone are normal. Muscle strength is normal.  REFLEXES: Reflexes are 2+ and symmetric at the biceps, triceps, knees, and ankles. Plantar responses are  flexor.  SENSORY: Light touch, pinprick, position sense, and vibration sense are intact in fingers and toes.  COORDINATION: Rapid alternating movements and fine finger movements are intact. There is no dysmetria on finger-to-nose and heel-knee-shin. There are no abnormal or extraneous movements.   GAIT/STANCE:  Narrow based and steady, mild difficulty with tandem walking   DIAGNOSTIC DATA (LABS, IMAGING, TESTING) - I reviewed patient records, labs, notes, testing and imaging myself where available.  ASSESSMENT AND PLAN  SHARVIL HOEY is a 44 y.o. male    Relapsing remitting multiple sclerosis,   clinically stable, and MRI showed no  Slow progression in comparison to 2008   He is tired of Avonex injection, was to consider other options,   He had a history of Crohn's disease,  was treated with mercaptopurine  For Short period of time in the past   JC virus was low titer 0.27, negative,    I have offered him options of Gilenya, Tecfidera, Tysarbri infusion, he will come in with his wife,  Who is  the nurse, to make final decision,   Marcial Pacas, M.D. Ph.D.   [facility] [address] [place], [place] [address] Ph: [phone] Fax: [phone]

## 2015-08-20 NOTE — Patient Instructions (Signed)
[email or web address]   Tablet form  Gilenya (fingolimod) ,  Once a day  Tecfidera (dimethyl fumarate)  Twice a day  Tysabri (natalizumab)   IV infusion once every 28 days

## 2015-09-24 ENCOUNTER — Encounter: Payer: Self-pay | Admitting: Neurology

## 2015-09-24 ENCOUNTER — Ambulatory Visit (INDEPENDENT_AMBULATORY_CARE_PROVIDER_SITE_OTHER): Payer: Medicare Other | Admitting: Neurology

## 2015-09-24 VITALS — BP 110/70 | HR 80 | Ht 71.0 in | Wt 195.0 lb

## 2015-09-24 DIAGNOSIS — G35 Multiple sclerosis: Secondary | ICD-10-CM | POA: Diagnosis not present

## 2015-09-24 DIAGNOSIS — K50118 Crohn's disease of large intestine with other complication: Secondary | ICD-10-CM

## 2015-09-24 NOTE — Progress Notes (Signed)
PATIENT: QUENCY TOBER DOB: 1970/10/28  Chief Complaint  Patient presents with  . Multiple Sclerosis    Patient is here for a f/u. He has no new complaints.     HISTORICAL  HUGHEY RITTENBERRY 44 year-old black male returns today for followup of RRMS.  He was seen by his [facility] since 2000, initially presenting with facial numbness, MRI demonstrates lesions consistent with MS, however he has lost followups, was suggested Copaxone in 2004, but never followed through, he was reevaluated by Dr. Ron Parker in February of 2008, he began to have bladder incontinence, dizziness, blurry vision, unbalanced gait, repeat MRI at that time continued to demonstrate multiple foci of signal hyperintensity, involving brainstem, superior peduncle, deep white matters, there was pericallosal involvement, no enhancement, but there was positive DWIs lesion, he was started on Avenox since 06/13/2007,  he continued to have flulike illness following injection,  He also reported a history of Crohn's disease, last treatment was in 2004 with Ascol, he no longer has significant GI symptoms, He was treated with immunosuppressive treatment mercaptopurine in late 1990s, for 3-4 months, still had diahrrea.  He had one to 2 flareups each year,  presenting with increased dizziness, unbalanced gait, blurry vision, worsening memory trouble, often partial improvement with steroid package.  Most recent MRI cervical was in July 2011, remote demyelinating plaque at C2 and C4, no enhancing lesions.  Repeat MRI brain and cervical in Jan 2013 showed a defined hyperintensity in the spinal cord at C2 which likely represents remote age inactive demyelinating plaque. No enhancing lesions are noted. As compared with previous MRI dated 04/30/2010 the C4 spinal cord hyperintensity is no longer seen.  MRI scan of the brain showed multiple brainstem, cerebellar, corpus callosal and periventricular white matter hyperintensities consistent with multiple  sclerosis. No enhancing lesions are noted. Presence of T1 black holes  andd corpus callosal atrophy indicate chronic disease. Overall no significant changes compared with MRI from 01/23/2007   He is doing very well, there was no new neurological symptoms, he is taking Avonex, mild flulike illness right out of the injection, improved by Tylenol, or aleve.  He complains of frequent bilateral frontal headaches.  We have reviewed MRI of the brain, which has demonstrated multiple periventricular and subcortical and infratentorial chronic demyelinating plaques. No acute plaques. Compared to prior MRI on 01/23/07, there are has been disease progression.  He continues to do very well, reported prior to MRI scan, he has been off of his Avonex for 4 months, due to insurance reasons, after discussed with Mr. Sobieski we decided to stay on current Avonex treatment, overall only slight change in his MRI scan there is no significant clinical worsening, we will repeat MRI of the brain and cervical spine with and without contrast in one year.  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 Carlean Purl, he has been treated with prednisone, mercaptopurine in 2011 148m daily for one year, ( which is a nucleoside metabolic inhibitor that results in cell-cycle arrest and death) and mesalamine in the past,   We decided not to proceed with TDwyane Dee  Keep him on Avonex he complains of 3 weeks history of left arm numbness, since January 2015, has much improved now,  UPDATE 07/28/2014: He had laparoscopic surgery in Sept 3rd 2015 at [facility], which did help him,   He has more memory loss. He also complains of fatigue. No recurrent mass  symptoms, he denied gait difficulty, no visual disorder  We have reviewed MRI of brain: scattered bilateral periventricular, subcortical, corpus callosum, cerebellar and brainstem white matter hyperintensities typical for demyelinating disease. No enhancing lesions are noted. The presence of T1 black holes and mild atrophy of corpus callosum indicates chronic disease. Compared with previous MRI scan dated 04/22/13 there appears to be slight disease activity progression   MRI scan of the cervical spine showing left lateral disc herniation at C5-6 with significant narrowing of the foramina. Spinal cord hyperintensities at C2, C4-5 and in the brainstem and cerebellum likely represent remote age demyelinating plaques. No enhancing lesions are noted.  UPDATE Jan 26th 2016: He complains of increased short term memory loss, no significant gait difficulty, no vision loss does complains of fatigue, especially with heat exposure We have reviewed most recent MRI of the brain in July 2016 with without contrast, in comparison to MRI of brain in 2008, and 2015, multiple rounded and ovoid, periventricular, subcortical, juxtacortical and infratentorial chronic demyelinating plaque, some T1 black holes, no abnormal enhancement, no change compared to previous scans  UPDATE Aug 20 2015: He went on missionary trip to [place] in Sep, he is tired of Avonex   UPDATE Sep 24 2015: He came in with his wife, and children at today's clinical visit, I have personally reviewed MRI of the film in 2016, in comparison with 2008, there was mild progression, he does has slow worsening mild gait difficulty, after extensive discussion, we decided proceed with Tysarbri IV infusion, he has history of Crohn's disease, low titer JC virus antibody 0.27 in July 2016, he has used mercaptopurine in 2011 172m daily for one year, both patient and his wife understand the potential risk and benefit, less than 10/998 chance of PML  REVIEW OF SYSTEMS:  Full 14 system review of systems performed and notable only for fatigue, swelling in legs, shortness of breath, cough, wheezing, anemia, increased thirst, joint pain, cramps, aching muscles, frequent infections, memory loss, confusion, headaches, tremor, insomnia, sleepiness, depression, anxiety, decreased energy, racing thoughts.  ALLERGIES: Allergies  Allergen Reactions  . Lialda [Mesalamine] Diarrhea    Severe diarrhea    HOME MEDICATIONS: Current Outpatient Prescriptions  Medication Sig Dispense Refill  . acetaminophen (TYLENOL) 325 MG tablet Take 650 mg by mouth every 6 (six) hours as needed.    . Interferon Beta-1a (AVONEX PEN) 30 MCG/0.5ML AJKT Inject 30 mcg into the muscle every 7 (seven) days. 1 each 3  . sulfamethoxazole-trimethoprim (BACTRIM DS,SEPTRA DS) 800-160 MG per tablet Take 1 tablet by mouth 2 (two) times daily. 20 tablet 0   No current facility-administered medications for this visit.    PAST MEDICAL HISTORY: Past Medical History  Diagnosis Date  . Crohn's disease ([place]   . Patent ductus arteriosus   . Atrial fibrillation ([facility]     pt and wife deny this  . Multiple sclerosis ([place]   . Colon stricture ([facility])     descending colon  . Anal fistula 1996  . Anal stenosis   . Regional enteritis of large intestine ([place]   . S/P left hemicolectomy 2015    [facility] for stricture  . 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 Neuromuscular disorder ([facility]     PAST SURGICAL HISTORY: Past Surgical History  Procedure  Laterality Date  . Colonoscopy      multiple  . Colon surgery  2015    descending colon stricture     FAMILY HISTORY: Family History  Problem Relation Age of Onset  . Prostate cancer Paternal Grandfather   . Diabetes Paternal Grandmother   . Colon cancer Neg Hx   . Rectal cancer Neg Hx   . Stomach cancer Neg Hx   . Ulcerative colitis Father   . Heart disease Mother     SOCIAL HISTORY:  Social History   Social  History  . Marital Status: Married    Spouse Name: [name]  . Number of Children: 3  . Years of Education: 13   Occupational History  . disabled    Social History Main Topics  . Smoking status: Former Smoker    Types: Cigarettes    Quit date: 10/24/2004  . Smokeless tobacco: Never Used  . Alcohol Use: No  . Drug Use: No  . Sexual Activity: Not on file   Other Topics Concern  . Not on file   Social History Narrative   Married, 1 son one daughter. 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 Multimedia programmer)   Disabled, previously worked at [employer], disability due to multiple sclerosis   Caffeine one daily   Left handed.   Education- one year of college.     PHYSICAL EXAM   Filed Vitals:   09/24/15 1555  BP: 110/70  Pulse: 80  Height: 5' 11"  (1.803 m)  Weight: 195 lb (88.451 kg)    Not recorded      Body mass index is 27.21 kg/(m^2).  PHYSICAL EXAMNIATION:  Gen: NAD, conversant, well nourised, obese, well groomed                     Cardiovascular: Regular rate rhythm, no peripheral edema, warm, nontender. Eyes: Conjunctivae clear without exudates or hemorrhage Neck: Supple, no carotid bruise. Pulmonary: Clear to auscultation bilaterally   NEUROLOGICAL EXAM:  MENTAL STATUS: Speech:    Speech is normal; fluent and spontaneous with normal comprehension.  Cognition:    The patient is oriented to person, place, and time;     recent and remote memory intact;     language fluent;     normal attention, concentration,     fund of knowledge.  CRANIAL NERVES: CN II: Visual fields are full to confrontation. Fundoscopic exam is normal with sharp discs, pupils were equal round reactive to light CN III, IV, VI: extraocular movement are normal. No ptosis. CN V: Facial sensation is intact to pinprick in all 3 divisions bilaterally. Corneal responses are intact.  CN VII: Face is symmetric with normal eye closure and smile. CN VIII: Hearing is normal to rubbing fingers CN IX, X: Palate elevates  symmetrically. Phonation is normal. CN XI: Head turning and shoulder shrug are intact CN XII: Tongue is midline with normal movements and no atrophy.  MOTOR: There is no pronator drift of out-stretched arms. Muscle bulk and tone are normal. Muscle strength is normal.  REFLEXES: Reflexes are 2+ and symmetric at the biceps, triceps, knees, and ankles. Plantar responses are flexor.  SENSORY: Light touch, pinprick, position sense, and vibration sense are intact in fingers and toes.  COORDINATION: Rapid alternating movements and fine finger movements are intact. There is no dysmetria on finger-to-nose and heel-knee-shin. There are no abnormal or extraneous movements.   GAIT/STANCE:  Narrow based and steady, mild difficulty with tandem walking   DIAGNOSTIC DATA (LABS, IMAGING, TESTING) - I  reviewed patient records, labs, notes, testing and imaging myself where available.  ASSESSMENT AND PLAN  GREG CRATTY is a 44 y.o. male    Relapsing remitting multiple sclerosis,   Clinically stable, MRI brain showed slow progression in comparison to 2008   He is tired of Avonex injection, consider other options,   He had a history of Crohn's disease, was treated with mercaptopurine 152m daily in 2011  JC virus was negative, low titer 0.27, negative in July 2016.  After extensive discussion with patient and his wife, we decided to proceed with TDwyane Dee potential PML risk was less than 10/998 Repeat JC virus titer every 6 months Repeat MRI of the brain every 12 months  YMarcial Pacas M.D. Ph.D.   [facility] [address] [place] [id number]: [phone]: [phone]

## 2015-09-28 ENCOUNTER — Encounter: Payer: Self-pay | Admitting: Family Medicine

## 2015-09-28 ENCOUNTER — Ambulatory Visit (INDEPENDENT_AMBULATORY_CARE_PROVIDER_SITE_OTHER): Payer: Medicare Other | Admitting: Family Medicine

## 2015-09-28 VITALS — BP 100/68 | HR 62 | Temp 98.1°F | Resp 12 | Ht 71.0 in | Wt 198.0 lb

## 2015-09-28 DIAGNOSIS — Z0000 Encounter for general adult medical examination without abnormal findings: Secondary | ICD-10-CM

## 2015-09-28 DIAGNOSIS — D849 Immunodeficiency, unspecified: Secondary | ICD-10-CM

## 2015-09-28 DIAGNOSIS — Z23 Encounter for immunization: Secondary | ICD-10-CM

## 2015-09-28 DIAGNOSIS — D899 Disorder involving the immune mechanism, unspecified: Secondary | ICD-10-CM | POA: Diagnosis not present

## 2015-09-28 NOTE — Progress Notes (Signed)
Subjective:    Patient ID: Jonathon Vazquez, male    DOB: January 20, 1971, 44 y.o.   MRN: [id number]  HPI  patient is a 44 year old African-American male who is here today for complete physical exam. Past medical history  Is significant for multiple sclerosis as well as Crohn's colitis on chronic immunosuppressive therapy. In 2015 he underwent partial resection of the colon. Most recent colonoscopy was in 2016 which showed some mild ulcerations around the anastomotic site and some friable lesions. In January he was found to have microcytic anemia with an MCV in the 70s. I believe this is likely due to his recent surgery as well as his Crohn's colitis and likely some underlying iron deficiency. No one is followed up since. Family history is significant for a grandfather with prostate cancer. At 44, he is now due for prostate exam. He is due for a flu shot. Given his chronic immunosuppressed status, he is also due for Pneumovax 23. He declines a tetanus shot today. Past Medical History  Diagnosis Date  . Crohn's disease ([place])   . Patent ductus arteriosus   . Atrial fibrillation ([place])     pt and wife deny this  . Multiple sclerosis ([place])   . Colon stricture ([facility])     descending colon  . Anal fistula 1996  . Anal stenosis   . Regional enteritis of large intestine ([place])   . S/P left hemicolectomy 2015    [facility]-done for stricture  . 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 Neuromuscular disorder [facility])    Past Surgical History  Procedure Laterality Date  . Colonoscopy      multiple  . Colon surgery  2015    descending colon stricture    Current Outpatient Prescriptions on File Prior to Visit  Medication Sig Dispense Refill  . acetaminophen (TYLENOL) 325 MG tablet Take 650 mg by mouth every 6 (six) hours as needed.    . Interferon Beta-1a (AVONEX PEN) 30 MCG/0.5ML AJKT Inject 30 mcg into the muscle every 7 (seven) days. 1 each 3   No current facility-administered  medications on file prior to visit.   Allergies  Allergen Reactions  . Lialda [Mesalamine] Diarrhea    Severe diarrhea   Social History   Social History  . Marital Status: Married    Spouse Name: [name]  . Number of Children: 3  . Years of Education: 13   Occupational History  . disabled    Social History Main Topics  . Smoking status: Former Smoker    Types: Cigarettes    Quit date: 10/24/2004  . Smokeless tobacco: Never Used  . Alcohol Use: No  . Drug Use: No  . Sexual Activity: Not on file   Other Topics Concern  . Not on file   Social History Narrative   Married, 1 son one daughter. 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 Multimedia programmer)   Disabled, previously worked at [employer], disability due to multiple sclerosis   Caffeine one daily   Left handed.   Education- one year of college.   Family History  Problem Relation Age of Onset  . Prostate cancer Paternal Grandfather   . Diabetes Paternal Grandmother   . Colon cancer Neg Hx   . Rectal cancer Neg Hx   . Stomach cancer Neg Hx   . Ulcerative colitis Father   . Heart disease Mother      Review of Systems  All other systems reviewed and are negative.  Objective:   Physical Exam  Constitutional: He is oriented to person, place, and time. He appears well-developed and well-nourished. No distress.  HENT:  Head: Normocephalic and atraumatic.  Right Ear: External ear normal.  Left Ear: External ear normal.  Nose: Nose normal.  Mouth/Throat: Oropharynx is clear and moist. No oropharyngeal exudate.  Eyes: Conjunctivae and EOM are normal. Pupils are equal, round, and reactive to light. Right eye exhibits no discharge. Left eye exhibits no discharge. No scleral icterus.  Neck: Normal range of motion. Neck supple. No JVD present. No tracheal deviation present. No thyromegaly present.  Cardiovascular: Normal rate, regular rhythm, normal heart sounds and intact distal pulses.  Exam reveals no gallop and no friction rub.   No murmur  heard. Pulmonary/Chest: Effort normal and breath sounds normal. No stridor. No respiratory distress. He has no wheezes. He has no rales. He exhibits no tenderness.  Abdominal: Soft. Bowel sounds are normal. He exhibits no distension and no mass. There is no tenderness. There is no rebound and no guarding.  Genitourinary: Rectum normal, prostate normal, testes normal and penis normal. Cremasteric reflex is present.  Musculoskeletal: Normal range of motion. He exhibits no edema or tenderness.  Lymphadenopathy:    He has no cervical adenopathy.  Neurological: He is alert and oriented to person, place, and time. He has normal reflexes. He displays normal reflexes. No cranial nerve deficit. He exhibits normal muscle tone. Coordination normal.  Skin: Skin is warm. No rash noted. He is not diaphoretic. No erythema. No pallor.  Psychiatric: He has a normal mood and affect. His behavior is normal. Judgment and thought content normal.  Vitals reviewed.         Assessment & Plan:  Routine general medical examination at a health care facility - Plan: CBC with Differential/Platelet, COMPLETE METABOLIC PANEL WITH GFR, Lipid panel, PSA  Immunosuppressed status ([place])    Patient's physical exam today is normal colonoscopy is up-to-date. Prostate exam is performed today and is normal. I would like the patient to return fasting so that I can check a CBC, CMP, fasting lipid panel. At that time I'll also check a PSA. If the patient is still anemic, I'll likely start the patient on iron replacement therapy. Patient received a flu shot today. He also received Pneumovax 23 given his chronic immunosuppressive status. Regular anticipatory guidance is provided. His blood pressure is excellent.

## 2015-09-28 NOTE — Addendum Note (Signed)
Addended by: Daylene Posey T on: 09/28/2015 01:07 PM   Modules accepted: Orders

## 2015-09-29 ENCOUNTER — Telehealth: Payer: Self-pay | Admitting: Neurology

## 2015-09-29 ENCOUNTER — Other Ambulatory Visit: Payer: Medicare Other

## 2015-09-29 LAB — CBC WITH DIFFERENTIAL/PLATELET
BASOS ABS: 0 10*3/uL (ref 0.0–0.1)
Basophils Relative: 0 % (ref 0–1)
EOS PCT: 2 % (ref 0–5)
Eosinophils Absolute: 0.1 10*3/uL (ref 0.0–0.7)
HEMATOCRIT: 39.5 % (ref 39.0–52.0)
HEMOGLOBIN: 13 g/dL (ref 13.0–17.0)
LYMPHS ABS: 1.3 10*3/uL (ref 0.7–4.0)
LYMPHS PCT: 21 % (ref 12–46)
MCH: 25.8 pg — ABNORMAL LOW (ref 26.0–34.0)
MCHC: 32.9 g/dL (ref 30.0–36.0)
MCV: 78.5 fL (ref 78.0–100.0)
MONO ABS: 0.4 10*3/uL (ref 0.1–1.0)
MPV: 10 fL (ref 8.6–12.4)
Monocytes Relative: 7 % (ref 3–12)
NEUTROS ABS: 4.5 10*3/uL (ref 1.7–7.7)
Neutrophils Relative %: 70 % (ref 43–77)
Platelets: 168 10*3/uL (ref 150–400)
RBC: 5.03 MIL/uL (ref 4.22–5.81)
RDW: 15.6 % — ABNORMAL HIGH (ref 11.5–15.5)
WBC: 6.4 10*3/uL (ref 4.0–10.5)

## 2015-09-29 LAB — COMPLETE METABOLIC PANEL WITH GFR
ALBUMIN: 4 g/dL (ref 3.6–5.1)
ALK PHOS: 62 U/L (ref 40–115)
ALT: 11 U/L (ref 9–46)
AST: 17 U/L (ref 10–40)
BILIRUBIN TOTAL: 1.1 mg/dL (ref 0.2–1.2)
BUN: 13 mg/dL (ref 7–25)
CALCIUM: 8.3 mg/dL — AB (ref 8.6–10.3)
CO2: 27 mmol/L (ref 20–31)
CREATININE: 1 mg/dL (ref 0.60–1.35)
Chloride: 105 mmol/L (ref 98–110)
GFR, Est Non African American: >89 mL/min (ref >=60)
Glucose, Bld: 82 mg/dL (ref 70–99)
Potassium: 4.3 mmol/L (ref 3.5–5.3)
Sodium: 138 mmol/L (ref 135–146)
Total Protein: 7 g/dL (ref 6.1–8.1)

## 2015-09-29 LAB — LIPID PANEL
CHOLESTEROL: 161 mg/dL (ref 125–200)
HDL: 50 mg/dL (ref >=40)
LDL Cholesterol: 100 mg/dL (ref <130)
Total CHOL/HDL Ratio: 3.2 Ratio (ref <=5.0)
Triglycerides: 56 mg/dL (ref <150)
VLDL: 11 mg/dL (ref <30)

## 2015-09-29 NOTE — Telephone Encounter (Signed)
Horton Finer with [facility] @[phone] is calling to get an authorization for Tysabri for this patient. The number to call to give this authorization is [phone]. Reference #[id number]. Thanks!

## 2015-09-29 NOTE — Telephone Encounter (Signed)
Information provided to Otila Kluver [facility]) - patient aware of status.

## 2015-09-30 LAB — PSA: PSA: 1.25 ng/mL (ref <=4.00)

## 2016-01-06 ENCOUNTER — Encounter: Payer: Self-pay | Admitting: Family Medicine

## 2016-01-06 ENCOUNTER — Ambulatory Visit: Payer: Medicare Other | Admitting: Family Medicine

## 2016-01-06 ENCOUNTER — Ambulatory Visit (INDEPENDENT_AMBULATORY_CARE_PROVIDER_SITE_OTHER): Payer: Medicare Other | Admitting: Family Medicine

## 2016-01-06 VITALS — BP 132/72 | HR 78 | Temp 100.6°F | Resp 16 | Ht 71.0 in | Wt 206.0 lb

## 2016-01-06 DIAGNOSIS — J101 Influenza due to other identified influenza virus with other respiratory manifestations: Secondary | ICD-10-CM | POA: Diagnosis not present

## 2016-01-06 DIAGNOSIS — J029 Acute pharyngitis, unspecified: Secondary | ICD-10-CM | POA: Diagnosis not present

## 2016-01-06 DIAGNOSIS — Z20828 Contact with and (suspected) exposure to other viral communicable diseases: Secondary | ICD-10-CM

## 2016-01-06 LAB — INFLUENZA A AND B AG, IMMUNOASSAY
Influenza A Antigen: NOT DETECTED
Influenza B Antigen: DETECTED — AB

## 2016-01-06 LAB — STREP GROUP A AG, W/REFLEX TO CULT: STREGTOCOCCUS GROUP A AG SCREEN: NOT DETECTED

## 2016-01-06 MED ORDER — OSELTAMIVIR PHOSPHATE 75 MG PO CAPS: 75.0000 mg | ORAL_CAPSULE | Freq: Every day | ORAL | Status: DC
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
## 2016-01-06 NOTE — Patient Instructions (Signed)
Take tamilfu as prescribed Take fever reducer F/U as needed

## 2016-05-24 ENCOUNTER — Encounter: Payer: Self-pay | Admitting: Internal Medicine

## 2016-08-12 ENCOUNTER — Encounter: Payer: Self-pay | Admitting: Internal Medicine

## 2016-08-12 ENCOUNTER — Ambulatory Visit (INDEPENDENT_AMBULATORY_CARE_PROVIDER_SITE_OTHER): Payer: Medicare Other | Admitting: Internal Medicine

## 2016-08-12 DIAGNOSIS — K50112 Crohn's disease of large intestine with intestinal obstruction: Secondary | ICD-10-CM

## 2016-08-12 NOTE — Progress Notes (Signed)
   Jonathon Vazquez 45 y.o. [birth year] [id number]  Assessment & Plan:   1. Crohn's disease of colon with intestinal obstruction ([name]) in past    Doing well at this time without diarrhea or constipation problems. Recheck colon, assess disease activity, that her random biopsies.  Colonoscopy will be scheduled. Use pediatric colonoscope most likely  He is on natalizumab for MS which also treats Crohn's  Subjective:   Chief Complaint: Follow-up of Crohn's disease.  HPI Jonathon Vazquez is here doing well, he continues to be without significant problems since he had his segmental colon resection and removal of stricture. His multiple sclerosis seems to be under reasonable control on the natalizumab  Medications, allergies, past medical history, past surgical history, family history and social history are reviewed and updated in the EMR.  Review of Systems As above  Objective:   Physical Exam BP 138/66   Pulse 84   Ht 5' 11"  (1.803 m)   Wt 200 lb (90.7 kg)   BMI 27.89 kg/m  NAD Lungs cta Cor s1s2 abd soft nt

## 2016-08-12 NOTE — Assessment & Plan Note (Signed)
Doing well Reassess w/ colonoscopy Influenza vaccine today

## 2016-08-12 NOTE — Patient Instructions (Signed)
   You have been scheduled for a colonoscopy. Please follow written instructions given to you at your visit today.  Please pick up your prep supplies at the pharmacy. If you use inhalers (even only as needed), please bring them with you on the day of your procedure. Your physician has requested that you go to www.startemmi.com and enter the access code given to you at your visit today. This web site gives a general overview about your procedure. However, you should still follow specific instructions given to you by our office regarding your preparation for the procedure.    Today you have been given a flu vaccine.     I appreciate the opportunity to care for you. Silvano Rusk, MD, [facility]

## 2016-09-29 ENCOUNTER — Other Ambulatory Visit: Payer: Medicare Other

## 2016-09-29 ENCOUNTER — Other Ambulatory Visit: Payer: Self-pay | Admitting: Family Medicine

## 2016-09-29 DIAGNOSIS — Z0000 Encounter for general adult medical examination without abnormal findings: Secondary | ICD-10-CM

## 2016-09-29 DIAGNOSIS — G35 Multiple sclerosis: Secondary | ICD-10-CM

## 2016-09-29 DIAGNOSIS — Z79899 Other long term (current) drug therapy: Secondary | ICD-10-CM

## 2016-09-29 LAB — COMPLETE METABOLIC PANEL WITH GFR
ALT: 13 U/L (ref 9–46)
AST: 19 U/L (ref 10–40)
Albumin: 4.2 g/dL (ref 3.6–5.1)
Alkaline Phosphatase: 70 U/L (ref 40–115)
BUN: 16 mg/dL (ref 7–25)
CO2: 25 mmol/L (ref 20–31)
Calcium: 9 mg/dL (ref 8.6–10.3)
Chloride: 104 mmol/L (ref 98–110)
Creat: 1.13 mg/dL (ref 0.60–1.35)
GFR, EST NON AFRICAN AMERICAN: 78 mL/min (ref >=60)
GFR, Est African American: >89 mL/min (ref >=60)
GLUCOSE: 84 mg/dL (ref 70–99)
POTASSIUM: 4.1 mmol/L (ref 3.5–5.3)
SODIUM: 140 mmol/L (ref 135–146)
Total Bilirubin: 1.2 mg/dL (ref 0.2–1.2)
Total Protein: 7.1 g/dL (ref 6.1–8.1)

## 2016-09-29 LAB — CBC WITH DIFFERENTIAL/PLATELET
BASOS ABS: 0 {cells}/uL (ref 0–200)
Basophils Relative: 0 %
EOS PCT: 3 %
Eosinophils Absolute: 192 cells/uL (ref 15–500)
HCT: 42.4 % (ref 38.5–50.0)
HEMOGLOBIN: 14.2 g/dL (ref 13.0–17.0)
LYMPHS ABS: 2944 {cells}/uL (ref 850–3900)
Lymphocytes Relative: 46 %
MCH: 28 pg (ref 27.0–33.0)
MCHC: 33.5 g/dL (ref 32.0–36.0)
MCV: 83.6 fL (ref 80.0–100.0)
MONOS PCT: 9 %
MPV: 10.1 fL (ref 7.5–12.5)
Monocytes Absolute: 576 cells/uL (ref 200–950)
NEUTROS PCT: 42 %
Neutro Abs: 2688 cells/uL (ref 1500–7800)
Platelets: 144 10*3/uL (ref 140–400)
RBC: 5.07 MIL/uL (ref 4.20–5.80)
RDW: 14.6 % (ref 11.0–15.0)
WBC: 6.4 10*3/uL (ref 3.8–10.8)

## 2016-09-29 LAB — LIPID PANEL
Cholesterol: 183 mg/dL (ref <200)
HDL: 45 mg/dL (ref >40)
LDL CALC: 117 mg/dL — AB (ref <100)
TRIGLYCERIDES: 103 mg/dL (ref <150)
Total CHOL/HDL Ratio: 4.1 Ratio (ref <5.0)
VLDL: 21 mg/dL (ref <30)

## 2016-09-29 LAB — TSH: TSH: 1.45 mIU/L (ref 0.40–4.50)

## 2016-09-30 ENCOUNTER — Other Ambulatory Visit: Payer: Medicare Other

## 2016-10-03 ENCOUNTER — Encounter: Payer: Self-pay | Admitting: Family Medicine

## 2016-10-03 ENCOUNTER — Ambulatory Visit (INDEPENDENT_AMBULATORY_CARE_PROVIDER_SITE_OTHER): Payer: Medicare Other | Admitting: Family Medicine

## 2016-10-03 VITALS — BP 100/68 | HR 68 | Temp 97.9°F | Resp 14 | Ht 71.0 in | Wt 203.0 lb

## 2016-10-03 DIAGNOSIS — Z23 Encounter for immunization: Secondary | ICD-10-CM

## 2016-10-03 DIAGNOSIS — D899 Disorder involving the immune mechanism, unspecified: Secondary | ICD-10-CM

## 2016-10-03 DIAGNOSIS — Z0000 Encounter for general adult medical examination without abnormal findings: Secondary | ICD-10-CM | POA: Diagnosis not present

## 2016-10-03 DIAGNOSIS — D849 Immunodeficiency, unspecified: Secondary | ICD-10-CM

## 2016-10-03 DIAGNOSIS — L0102 Bockhart's impetigo: Secondary | ICD-10-CM

## 2016-10-03 LAB — PSA: PSA: 1 ng/mL (ref <=4.0)

## 2016-10-03 MED ORDER — TRETINOIN 0.05 % EX CREA: TOPICAL_CREAM | Freq: Every day | CUTANEOUS | 3 refills | Status: DC

## 2016-10-03 NOTE — Addendum Note (Signed)
Addended by: Shary Decamp B on: 10/03/2016 10:19 AM   Modules accepted: Orders

## 2016-10-03 NOTE — Progress Notes (Signed)
Subjective:    Patient ID: Jonathon Vazquez, male    DOB: May 19, 1971, 45 y.o.   MRN: [id number]  HPI  patient is a 45 year old African-American male who is here today for complete physical exam. Past medical history  Is significant for multiple sclerosis as well as Crohn's colitis on chronic immunosuppressive therapy. In 2015 he underwent partial resection of the colon. Most recent colonoscopy was in 2016 which showed some mild ulcerations around the anastomotic site and some friable lesions. Family history is significant for a grandfather with prostate cancer.  His flu shot is up-to-date. He received Pneumovax 23 last year. He is due for Prevnar 13 this year. 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 When he ruptures these pustules, he develops keloids. He is interested in seeing dermatology to determine if there is any options to help prevent this pustular dermatitis on his neck and upper back and on his shoulders Past Medical History:  Diagnosis Date  . Anal fistula 1996  . Anal stenosis   . Atrial fibrillation ([place])    pt and wife deny this  . Colon stricture    descending colon  . Crohn's disease ([place])   . Multiple sclerosis ([place])   . Neuromuscular disorder ([place])   . Patent ductus arteriosus   . Regional enteritis of large intestine ([place])   . S/P left hemicolectomy 2015   [facility]-done for stricture  . Stricture of descending colon due to Crohn's disease 02/13/2012   Past Surgical History:  Procedure Laterality Date  . COLON SURGERY  2015   descending colon stricture   . COLONOSCOPY     multiple   Current Outpatient Prescriptions on File Prior to Visit  Medication Sig Dispense Refill  . acetaminophen (TYLENOL) 325 MG tablet Take 650 mg by mouth every 6 (six) hours as needed.    . natalizumab (TYSABRI) 300 MG/15ML injection Inject into the vein.     No current facility-administered medications on file prior to visit.     Allergies  Allergen Reactions  . Lialda [Mesalamine] Diarrhea    Severe diarrhea   Social History   Social History  . Marital status: Married    Spouse name: [name]  . Number of children: 3  . Years of education: 57   Occupational History  . disabled    Social History Main Topics  . Smoking status: Former Smoker    Types: Cigarettes    Quit date: 10/24/2004  . Smokeless tobacco: Never Used  . Alcohol use No  . Drug use: No  . Sexual activity: Not on file   Other Topics Concern  . Not on file   Social History Narrative   Married, 1 son one daughter. 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 Multimedia programmer)   Disabled, previously worked at [employer], disability due to multiple sclerosis   Caffeine one daily   Left handed.   Education- one year of college.   Family History  Problem Relation Age of Onset  . Ulcerative colitis Father   . Heart disease Mother   . Prostate cancer Paternal Grandfather   . Diabetes Paternal Grandmother   . Colon cancer Neg Hx   . Rectal cancer Neg Hx   . Stomach cancer Neg Hx      Review of Systems  All other systems reviewed and are negative.      Objective:   Physical Exam  Constitutional: He is oriented to person, place, and time. He  appears well-developed and well-nourished. No distress.  HENT:  Head: Normocephalic and atraumatic.  Right Ear: External ear normal.  Left Ear: External ear normal.  Nose: Nose normal.  Mouth/Throat: Oropharynx is clear and moist. No oropharyngeal exudate.  Eyes: Conjunctivae and EOM are normal. Pupils are equal, round, and reactive to light. Right eye exhibits no discharge. Left eye exhibits no discharge. No scleral icterus.  Neck: Normal range of motion. Neck supple. No JVD present. No tracheal deviation present. No thyromegaly present.  Cardiovascular: Normal rate, regular rhythm, normal heart sounds and intact distal pulses.  Exam reveals no gallop and no friction rub.   No murmur heard. Pulmonary/Chest: Effort normal  and breath sounds normal. No stridor. No respiratory distress. He has no wheezes. He has no rales. He exhibits no tenderness.  Abdominal: Soft. Bowel sounds are normal. He exhibits no distension and no mass. There is no tenderness. There is no rebound and no guarding.  Genitourinary: Testes normal and penis normal. Cremasteric reflex is present.  Musculoskeletal: Normal range of motion. He exhibits no edema or tenderness.  Lymphadenopathy:    He has no cervical adenopathy.  Neurological: He is alert and oriented to person, place, and time. He has normal reflexes. No cranial nerve deficit. He exhibits normal muscle tone. Coordination normal.  Skin: Skin is warm. No rash noted. He is not diaphoretic. No erythema. No pallor.  Psychiatric: He has a normal mood and affect. His behavior is normal. Judgment and thought content normal.  Vitals reviewed.         Assessment & Plan:  Routine general medical examination at a health care facility  Immunosuppressed status ([place])  Pustular folliculitis - Plan: Ambulatory referral to Dermatology    Patient's physical exam today is normal and his colonoscopy is up-to-date. CBC, CMP, fasting lipid panel are excellent. He received Prevnar 13 today in office due to the fact he is immunocompromised. I will start the patient on Retin-A cream applied daily to the rash on his neck and shoulders. If not beneficial, I will set the patient up to see a dermatologist. Otherwise regular anticipatory guidance was provided. I will add a PSA to his lab work. Lab on 09/29/2016  Component Date Value Ref Range Status  . Sodium 09/29/2016 140  135 - 146 mmol/L Final  . Potassium 09/29/2016 4.1  3.5 - 5.3 mmol/L Final  . Chloride 09/29/2016 104  98 - 110 mmol/L Final  . CO2 09/29/2016 25  20 - 31 mmol/L Final  . Glucose, Bld 09/29/2016 84  70 - 99 mg/dL Final  . BUN 09/29/2016 16  7 - 25 mg/dL Final  . Creat 09/29/2016 1.13  0.60 - 1.35 mg/dL Final  . Total Bilirubin  09/29/2016 1.2  0.2 - 1.2 mg/dL Final  . Alkaline Phosphatase 09/29/2016 70  40 - 115 U/L Final  . AST 09/29/2016 19  10 - 40 U/L Final  . ALT 09/29/2016 13  9 - 46 U/L Final  . Total Protein 09/29/2016 7.1  6.1 - 8.1 g/dL Final  . Albumin 09/29/2016 4.2  3.6 - 5.1 g/dL Final  . Calcium 09/29/2016 9.0  8.6 - 10.3 mg/dL Final  . GFR, Est African American 09/29/2016 >89  >=60 mL/min Final  . GFR, Est Non African American 09/29/2016 78  >=60 mL/min Final  . TSH 09/29/2016 1.45  0.40 - 4.50 mIU/L Final  . Cholesterol 09/29/2016 183  <200 mg/dL Final  . Triglycerides 09/29/2016 103  <150 mg/dL Final  . HDL  09/29/2016 45  >40 mg/dL Final  . Total CHOL/HDL Ratio 09/29/2016 4.1  <5.0 Ratio Final  . VLDL 09/29/2016 21  <30 mg/dL Final  . LDL Cholesterol 09/29/2016 117* <100 mg/dL Final  . WBC 09/29/2016 6.4  3.8 - 10.8 K/uL Final  . RBC 09/29/2016 5.07  4.20 - 5.80 MIL/uL Final  . Hemoglobin 09/29/2016 14.2  13.0 - 17.0 g/dL Final  . HCT 09/29/2016 42.4  38.5 - 50.0 % Final  . MCV 09/29/2016 83.6  80.0 - 100.0 fL Final  . MCH 09/29/2016 28.0  27.0 - 33.0 pg Final  . MCHC 09/29/2016 33.5  32.0 - 36.0 g/dL Final  . RDW 09/29/2016 14.6  11.0 - 15.0 % Final  . Platelets 09/29/2016 144  140 - 400 K/uL Final  . MPV 09/29/2016 10.1  7.5 - 12.5 fL Final  . Neutro Abs 09/29/2016 2688  1,500 - 7,800 cells/uL Final  . Lymphs Abs 09/29/2016 2944  850 - 3,900 cells/uL Final  . Monocytes Absolute 09/29/2016 576  200 - 950 cells/uL Final  . Eosinophils Absolute 09/29/2016 192  15 - 500 cells/uL Final  . Basophils Absolute 09/29/2016 0  0 - 200 cells/uL Final  . Neutrophils Relative % 09/29/2016 42  % Final  . Lymphocytes Relative 09/29/2016 46  % Final  . Monocytes Relative 09/29/2016 9  % Final  . Eosinophils Relative 09/29/2016 3  % Final  . Basophils Relative 09/29/2016 0  % Final  . Smear Review 09/29/2016 Criteria for review not met   Final

## 2016-10-04 ENCOUNTER — Telehealth: Payer: Self-pay | Admitting: Family Medicine

## 2016-10-04 NOTE — Telephone Encounter (Signed)
Submitted PA through [email or web address]

## 2016-10-05 ENCOUNTER — Ambulatory Visit (INDEPENDENT_AMBULATORY_CARE_PROVIDER_SITE_OTHER): Payer: Medicare Other | Admitting: Nurse Practitioner

## 2016-10-05 ENCOUNTER — Encounter: Payer: Self-pay | Admitting: Nurse Practitioner

## 2016-10-05 VITALS — BP 118/75 | HR 89 | Ht 71.0 in | Wt 201.0 lb

## 2016-10-05 DIAGNOSIS — G35 Multiple sclerosis: Secondary | ICD-10-CM | POA: Diagnosis not present

## 2016-10-05 MED ORDER — CLINDAMYCIN PHOS-BENZOYL PEROX 1-5 % EX GEL: Freq: Two times a day (BID) | CUTANEOUS | 0 refills | Status: DC

## 2016-10-05 NOTE — Progress Notes (Signed)
[facility]  PATIENT: Jonathon Vazquez DOB: [birth year]   REASON FOR VISIT: Follow-up for multiple sclerosis, relapsing remitting HISTORY FROM: Patient    HISTORY OF PRESENT ILLNESS:YYRicky L Vazquez 45 year-old black male returns today for followup of RRMS.  He was seen by his [facility] since 2000, initially presenting with facial numbness, MRI demonstrates lesions consistent with MS, however he has lost followups, was suggested Copaxone in 2004, but never followed through, he was reevaluated by Dr. Ron Parker in February of 2008, he began to have bladder incontinence, dizziness, blurry vision, unbalanced gait, repeat MRI at that time continued to demonstrate multiple foci of signal hyperintensity, involving brainstem, superior peduncle, deep white matters, there was pericallosal involvement, no enhancement, but there was positive DWIs lesion, he was started on Avenox since 06/13/2007,  he continued to have flulike illness following injection,  He also reported a history of Crohn's disease, last treatment was in 2004 with [place], he no longer has significant GI symptoms, He was treated with immunosuppressive treatment mercaptopurine in late 1990s, for 3-4 months, still had diahrrea.  He had one to 2 flareups each year,  presenting with increased dizziness, unbalanced gait, blurry vision, worsening memory trouble, often partial improvement with steroid package.  Most recent MRI cervical was in July 2011, remote demyelinating plaque at C2 and C4, no enhancing lesions.  Repeat MRI brain and cervical in Jan 2013 showed a defined hyperintensity in the spinal cord at C2 which likely represents remote age inactive demyelinating plaque. No enhancing lesions are noted. As compared with previous MRI dated 04/30/2010 the C4 spinal cord hyperintensity is no longer seen.  MRI scan of the brain showed multiple brainstem, cerebellar, corpus callosal and periventricular white matter hyperintensities  consistent with multiple sclerosis. No enhancing lesions are noted. Presence of T1 black holes  andd corpus callosal atrophy indicate chronic disease. Overall no significant changes compared with MRI from 01/23/2007   He is doing very well, there was no new neurological symptoms, he is taking Avonex, mild flulike illness right out of the injection, improved by Tylenol, or aleve.  He complains of frequent bilateral frontal headaches.  We have reviewed MRI of the brain, which has demonstrated multiple periventricular and subcortical and infratentorial chronic demyelinating plaques. No acute plaques. Compared to prior MRI on 01/23/07, there are has been disease progression.  He continues to do very well, reported prior to MRI scan, he has been off of his Avonex for 4 months, due to insurance reasons, after discussed with Mr. Rodin we decided to stay on current Avonex treatment, overall only slight change in his MRI scan there is no significant clinical worsening, we will repeat MRI of the brain and cervical spine with and without contrast in one year.  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 Carlean Purl, he has been treated with prednisone, mercaptopurine in 2011 176m daily for one year, ( which is a nucleoside metabolic inhibitor that results in cell-cycle arrest and death) and mesalamine in the past,   We decided not to proceed with [place]  Keep him on Avonex he complains of 3 weeks history of left arm numbness, since January 2015, has much improved now,    UPDATE 07/28/2014:YY He had laparoscopic surgery  in Sept 3rd 2015 at [facility], which did help him,   He has more memory loss. He also  complains of fatigue. No recurrent mass symptoms, he denied gait difficulty, no visual disorder  We have reviewed MRI of brain: scattered bilateral periventricular, subcortical, corpus callosum, cerebellar and brainstem white matter hyperintensities typical for demyelinating disease. No enhancing lesions are noted. The presence of T1 black holes and mild atrophy of corpus callosum indicates chronic disease. Compared with previous MRI scan dated 04/22/13 there appears to be slight disease activity progression   MRI scan of the cervical spine showing left lateral disc herniation at C5-6 with significant narrowing of the foramina. Spinal cord hyperintensities at C2, C4-5 and in the brainstem and cerebellum likely represent remote age demyelinating plaques. No enhancing lesions are noted.  UPDATE Jan 26th 2016:YY He complains of increased short term memory loss, no significant gait difficulty, no vision loss does complains of fatigue, especially with heat exposure We have reviewed most recent MRI of the brain in July 2016 with without contrast, in comparison to MRI of brain in 2008, and 2015, multiple rounded and ovoid, periventricular, subcortical, juxtacortical and infratentorial chronic demyelinating plaque, some T1 black holes, no abnormal enhancement, no change compared to previous scans  UPDATE Aug 20 2015:YY He went on missionary trip to [place] in Sep, he is tired of Avonex   UPDATE Sep 24 2015:YY He came in with his wife, and children at today's clinical visit, I have personally reviewed MRI of the film in 2016, in comparison with 2008, there was mild progression, he does has slow worsening mild gait difficulty, after extensive discussion, we decided proceed with Tysarbri IV infusion, he has history of Crohn's disease, low titer JC virus antibody 0.27 in July 2016, he has used mercaptopurine in 2011 19m daily for one year, both patient and his wife understand the potential risk and benefit,  less than 10/998 chance of PML UPDATE 12/13/2017CM. Mr. SBoschert 45year old male returns for follow-up with relapsing remitting multiple sclerosis. He has been receiving Tysabri for about a year.JC virus antibody positive 0.43 (09/09/2013), indertminate 0.39 Jan 27th 2016,  0.26 negative July 2016. Last MRI of the brain July 5 ,2016. He also has a history of Crohn's and is due to get a colonoscopy at the end of the month. Recent CBC and CMP within normal limits on 09/29/2016. He denies any falls or balance issues. He goes to the gym at least once a week. He denies any new weakness or spasticity sensory changes, visual changes speech or swallowing difficulty bowel or bladder problems. He returns for reevaluation  REVIEW OF SYSTEMS: Full 14 system review of systems performed and notable only for those listed, all others are neg:  Constitutional: neg  Cardiovascular: neg Ear/Nose/Throat: neg  Skin: neg Eyes: neg Respiratory: neg Gastroitestinal: neg  Hematology/Lymphatic: neg  Endocrine: neg Musculoskeletal:neg Allergy/Immunology: neg Neurological: neg Psychiatric: neg Sleep : neg   ALLERGIES: Allergies  Allergen Reactions  . Lialda [Mesalamine] Diarrhea    Severe diarrhea    HOME MEDICATIONS: Outpatient Medications Prior to Visit  Medication Sig Dispense Refill  . acetaminophen (TYLENOL) 325 MG tablet Take 650 mg by mouth every 6 (six) hours as needed.    . natalizumab (TYSABRI) 300 MG/15ML injection Inject into the vein.    . clindamycin-benzoyl peroxide (BENZACLIN) gel Apply topically 2 (two) times daily. (Patient not taking: Reported on 10/05/2016) 50 g 0  . tretinoin (RETIN-A) 0.05 % cream Apply topically at bedtime. 45 g 3  No facility-administered medications prior to visit.     PAST MEDICAL HISTORY: Past Medical History:  Diagnosis Date  . Anal fistula 1996  . Anal stenosis   . Atrial fibrillation ([place])    pt and wife deny this  . Colon stricture    descending  colon  . Crohn's disease ([place])   . Multiple sclerosis ([place])   . Neuromuscular disorder ([place])   . Patent ductus arteriosus   . Regional enteritis of large intestine ([place])   . S/P left hemicolectomy 2015   [facility]-done for stricture  . 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 COLON SURGERY  2015   descending colon stricture   . COLONOSCOPY     multiple    FAMILY HISTORY: Family History  Problem Relation Age of Onset  . Ulcerative colitis Father   . Heart disease Mother   . Prostate cancer Paternal Grandfather   . Diabetes Paternal Grandmother   . Colon cancer Neg Hx   . Rectal cancer Neg Hx   . Stomach cancer Neg Hx     SOCIAL HISTORY: Social History   Social History  . Marital status: Married    Spouse name: [name]  . Number of children: 3  . Years of education: 42   Occupational History  . disabled    Social History Main Topics  . Smoking status: Former Smoker    Types: Cigarettes    Quit date: 10/24/2004  . Smokeless tobacco: Never Used  . Alcohol use No  . Drug use: No  . Sexual activity: Not on file   Other Topics Concern  . Not on file   Social History Narrative   Married, 1 son one daughter. 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 Multimedia programmer)   Disabled, previously worked at [employer], disability due to multiple sclerosis   Caffeine one daily   Left handed.   Education- one year of college.     PHYSICAL EXAM  Vitals:   10/05/16 1059  BP: 118/75  Pulse: 89  Weight: 201 lb (91.2 kg)  Height: 5' 11"  (1.803 m)   Body mass index is 28.03 kg/m.  Generalized: Well developed, in no acute distress  Head: normocephalic and atraumatic,. Oropharynx benign  Neck: Supple, no carotid bruits  Cardiac: Regular rate rhythm, no murmur  Musculoskeletal: No deformity   Neurological examination   Mentation: Alert oriented to time, place, history taking. Attention span and  concentration appropriate. Recent and remote memory intact.  Follows all commands speech and language fluent.   Cranial nerve II-XII: Fundoscopic exam reveals sharp disc margins.Pupils were equal round reactive to light extraocular movements were full, visual field were full on confrontational test. Facial sensation and strength were normal. hearing was intact to finger rubbing bilaterally. Uvula tongue midline. head turning and shoulder shrug were normal and symmetric.Tongue protrusion into cheek strength was normal. Motor: normal bulk and tone, full strength in the BUE, BLE, fine finger movements normal, no pronator drift. No focal weakness Sensory: normal and symmetric to light touch, pinprick, and  Vibration,In the upper and lower extremities  Coordination: finger-nose-finger, heel-to-shin bilaterally, no dysmetria Reflexes: Brachioradialis 2/2, biceps 2/2, triceps 2/2, patellar 2/2, Achilles 2/2, plantar responses were flexor bilaterally. Gait and Station: Rising up from seated position without assistance, normal stance,  moderate stride, good arm swing, smooth turning, able to perform tiptoe, and heel walking without difficulty. Tandem gait is mildly unsteady , no assistive  device  DIAGNOSTIC DATA (LABS, IMAGING, TESTING) - I reviewed patient records, labs, notes, testing and imaging myself where available.  Lab Results  Component Value Date   WBC 6.4 09/29/2016   HGB 14.2 09/29/2016   HCT 42.4 09/29/2016   MCV 83.6 09/29/2016   PLT 144 09/29/2016      Component Value Date/Time   NA 140 09/29/2016 1100   NA 139 11/18/2014 1308   K 4.1 09/29/2016 1100   CL 104 09/29/2016 1100   CO2 25 09/29/2016 1100   GLUCOSE 84 09/29/2016 1100   BUN 16 09/29/2016 1100   BUN 10 11/18/2014 1308   CREATININE 1.13 09/29/2016 1100   CALCIUM 9.0 09/29/2016 1100   PROT 7.1 09/29/2016 1100   PROT 7.2 11/18/2014 1308   ALBUMIN 4.2 09/29/2016 1100   ALBUMIN 4.4 11/18/2014 1308   AST 19 09/29/2016  1100   ALT 13 09/29/2016 1100   ALKPHOS 70 09/29/2016 1100   BILITOT 1.2 09/29/2016 1100   GFRNONAA 78 09/29/2016 1100   GFRAA >89 09/29/2016 1100   Lab Results  Component Value Date   CHOL 183 09/29/2016   HDL 45 09/29/2016   LDLCALC 117 (H) 09/29/2016   TRIG 103 09/29/2016   CHOLHDL 4.1 09/29/2016    Lab Results  Component Value Date   TSH 1.45 09/29/2016      ASSESSMENT AND PLAN KENDRY PFARR is a 45 y.o. male With a history of relapsing remitting multiple sclerosis currently on Tysabri monthly. Clinically he is stable . He has a history of Crohn's disease was treated with mercaptopurine 125m daily in 2011 JC virus was negative, low titer 0.27, negative in July 2016.  PLAN: Reviewed recent labs primary care, CBC and CMP JC virus today Schedule repeat MRI of the brain with and without for progression of MS Follow-up in 6 months NDennie Bible [facility] APRN  [facility] [address] [place] [phone]

## 2016-10-05 NOTE — Progress Notes (Signed)
I have reviewed and agreed above plan. 

## 2016-10-05 NOTE — Telephone Encounter (Signed)
PA Denied per [facility]P try Benzaclin gel. Med sent to pharm.

## 2016-10-05 NOTE — Patient Instructions (Signed)
Reviewed recent labs primary care, CBC and CMP JC virus today Schedule repeat MRI of the brain with and without Follow-up in 6 months

## 2016-10-10 ENCOUNTER — Encounter: Payer: Self-pay | Admitting: Internal Medicine

## 2016-10-13 ENCOUNTER — Ambulatory Visit
Admission: RE | Admit: 2016-10-13 | Discharge: 2016-10-13 | Disposition: A | Discharge status: Home / Self Care | Payer: Medicare Other | Source: Ambulatory Visit | Attending: Nurse Practitioner | Admitting: Nurse Practitioner

## 2016-10-13 DIAGNOSIS — G35 Multiple sclerosis: Secondary | ICD-10-CM

## 2016-10-13 MED ORDER — GADOBENATE DIMEGLUMINE 529 MG/ML IV SOLN
20.0000 mL | Freq: Once | INTRAVENOUS | Status: AC | PRN
  Administered 2016-10-13: 20 mL via INTRAVENOUS

## 2016-10-19 ENCOUNTER — Telehealth: Payer: Self-pay | Admitting: *Deleted

## 2016-10-19 NOTE — Telephone Encounter (Signed)
Labs collected 10/06/16:  JCV 0.24 Indeterminate

## 2016-10-19 NOTE — Telephone Encounter (Signed)
Per Daun Peacock, NP, LVM informing patient that his MRI brain is consistent with chronic demyelinating plaques associated with his  multiple sclerosis. There are no changes when compared to the MRI from 04/28/2015. There are no new or acute findings. Left name, number for any questions.

## 2016-10-20 ENCOUNTER — Encounter: Payer: Self-pay | Admitting: Internal Medicine

## 2016-10-20 ENCOUNTER — Telehealth: Payer: Self-pay | Admitting: *Deleted

## 2016-10-20 ENCOUNTER — Ambulatory Visit (AMBULATORY_SURGERY_CENTER): Payer: Medicare Other | Admitting: Internal Medicine

## 2016-10-20 VITALS — BP 101/67 | HR 58 | Temp 98.2°F | Resp 14 | Ht 71.0 in | Wt 200.0 lb

## 2016-10-20 DIAGNOSIS — K635 Polyp of colon: Secondary | ICD-10-CM | POA: Diagnosis not present

## 2016-10-20 DIAGNOSIS — K50112 Crohn's disease of large intestine with intestinal obstruction: Secondary | ICD-10-CM | POA: Diagnosis not present

## 2016-10-20 DIAGNOSIS — D120 Benign neoplasm of cecum: Secondary | ICD-10-CM

## 2016-10-20 MED ORDER — SODIUM CHLORIDE 0.9 % IV SOLN: 500.0000 mL | INTRAVENOUS | Status: DC

## 2016-10-20 NOTE — Telephone Encounter (Signed)
-----   Message from Dennie Bible, NP sent at 10/19/2016  3:31 PM EST ----- MRI consistent with chronic demyelinating plaques associated with multiple sclerosis no changes when compared to 04/28/2015. No acute findings. Please call patient

## 2016-10-20 NOTE — Progress Notes (Signed)
Report to PACU, RN, vss, BBS= Clear.  

## 2016-10-20 NOTE — Op Note (Signed)
[place] Patient Name: Jonathon Vazquez Procedure Date: 10/20/2016 9:14 AM MRN: [id number] Endoscopist: Gatha Mayer , MD Age: 45 Referring MD:  Date of Birth: Aug 10, 1971 Gender: Male Account #: [id number] Procedure:                Colonoscopy Indications:              Crohn's disease of the colon, Follow-up of Crohn's                            disease of the colon, Disease activity assessment                            of Crohn's disease of the colon, Assess therapeutic                            response to therapy of Crohn's disease of the colon Medicines:                Propofol per Anesthesia, Monitored Anesthesia Care Procedure:                Pre-Anesthesia Assessment:                           - Prior to the procedure, a History and Physical                            was performed, and patient medications and                            allergies were reviewed. The patient's tolerance of                            previous anesthesia was also reviewed. The risks                            and benefits of the procedure and the sedation                            options and risks were discussed with the patient.                            All questions were answered, and informed consent                            was obtained. Prior Anticoagulants: The patient has                            taken no previous anticoagulant or antiplatelet                            agents. ASA Grade Assessment: II - A patient with                            mild systemic disease. After reviewing the risks  and benefits, the patient was deemed in                            satisfactory condition to undergo the procedure.                           After obtaining informed consent, the colonoscope                            was passed under direct vision. Throughout the                            procedure, the patient's blood pressure, pulse, and                 oxygen saturations were monitored continuously. The                            Model PCF-[id number]) scope was introduced                            through the anus and advanced to the the terminal                            ileum, with identification of the appendiceal                            orifice and IC valve. The colonoscopy was performed                            without difficulty. The patient tolerated the                            procedure well. The quality of the bowel                            preparation was good. The bowel preparation used                            was Miralax. The terminal ileum, ileocecal valve,                            appendiceal orifice, and rectum were photographed. Scope In: 9:21:33 AM Scope Out: 9:34:44 AM Scope Withdrawal Time: 0 hours 10 minutes 59 seconds  Total Procedure Duration: 0 hours 13 minutes 11 seconds  Findings:                 The perianal and digital rectal examinations were                            normal. Pertinent negatives include normal prostate                            (size, shape, and consistency).  The terminal ileum appeared normal.                           There was evidence of a prior end-to-side                            colo-colonic anastomosis in the proximal sigmoid                            colon. This was patent and was characterized by                            healthy appearing mucosa. The anastomosis was                            traversed.                           A benign-appearing, intrinsic mild stenosis was                            found in the cecum and was traversed.                           A 5 mm polyp was found in the cecum. The polyp was                            sessile. The polyp was removed with a cold snare.                            Resection and retrieval were complete. Verification                            of patient  identification for the specimen was                            done. Estimated blood loss was minimal.                           The exam was otherwise without abnormality on                            direct and retroflexion views.                           Multiple biopsies were obtained with cold forceps                            for Crohn's disease surveillance randomly in the                            entire colon. Verification of patient                            identification for  the specimen was done. Estimated                            blood loss was minimal. Complications:            No immediate complications. Estimated Blood Loss:     Estimated blood loss was minimal. Impression:               - The examined portion of the ileum was normal.                           - Patent end-to-side colo-colonic anastomosis,                            characterized by healthy appearing mucosa.                           - Stricture in the cecum. Very mild deformity no                            inflammation.                           - One 5 mm polyp in the cecum, removed with a cold                            snare. Resected and retrieved.                           - The examination was otherwise normal on direct                            and retroflexion views.                           - Multiple biopsies were obtained in the entire                            colon. Recommendation:           - Patient has a contact number available for                            emergencies. The signs and symptoms of potential                            delayed complications were discussed with the                            patient. Return to normal activities tomorrow.                            Written discharge instructions were provided to the                            patient.                           -  Resume previous diet.                           - Continue present medications.                            - Repeat colonoscopy is recommended. The                            colonoscopy date will be determined after pathology                            results from today's exam become available for                            review.                           -                           LOOKS LIKE HE IS IN REMISSION ON TYSABRI (PRIMARY                            INDICATION FOR THIS RX IS MS) Gatha Mayer, MD 10/20/2016 9:45:10 AM This report has been signed electronically.

## 2016-10-20 NOTE — Telephone Encounter (Signed)
Noted that RN LMVM for pt of results.  No acute findings and no changes compared to MR 04-28-15.

## 2016-10-20 NOTE — Progress Notes (Signed)
Called to room to assist during endoscopic procedure.  Patient ID and intended procedure confirmed with present staff. Received instructions for my participation in the procedure from the performing physician.  

## 2016-10-20 NOTE — Patient Instructions (Addendum)
   Things look good - I did not see any active inflammation. I did remove a small polyp that looks benign. Will see what the biopsies show.  I appreciate the opportunity to care for you. Gatha Mayer, MD, FACG YOU HAD AN ENDOSCOPIC PROCEDURE TODAY AT [facility]:   Refer to the procedure report that was given to you for any specific questions about what was found during the examination.  If the procedure report does not answer your questions, please call your gastroenterologist to clarify.  If you requested that your care partner not be given the details of your procedure findings, then the procedure report has been included in a sealed envelope for you to review at your convenience later.  YOU SHOULD EXPECT: Some feelings of bloating in the abdomen. Passage of more gas than usual.  Walking can help get rid of the air that was put into your GI tract during the procedure and reduce the bloating. If you had a lower endoscopy (such as a colonoscopy or flexible sigmoidoscopy) you may notice spotting of blood in your stool or on the toilet paper. If you underwent a bowel prep for your procedure, you may not have a normal bowel movement for a few days.  Please Note:  You might notice some irritation and congestion in your nose or some drainage.  This is from the oxygen used during your procedure.  There is no need for concern and it should clear up in a day or so.  SYMPTOMS TO REPORT IMMEDIATELY:   Following lower endoscopy (colonoscopy or flexible sigmoidoscopy):  Excessive amounts of blood in the stool  Significant tenderness or worsening of abdominal pains  Swelling of the abdomen that is new, acute  Fever of 100F or higher  For urgent or emergent issues, a gastroenterologist can be reached at any hour by calling [phone].   DIET:  We do recommend a small meal at first, but then you may proceed to your regular diet.  Drink plenty of fluids but you should avoid  alcoholic beverages for 24 hours.  ACTIVITY:  You should plan to take it easy for the rest of today and you should NOT DRIVE or use heavy machinery until tomorrow (because of the sedation medicines used during the test).    FOLLOW UP: Our staff will call the number listed on your records the next business day following your procedure to check on you and address any questions or concerns that you may have regarding the information given to you following your procedure. If we do not reach you, we will leave a message.  However, if you are feeling well and you are not experiencing any problems, there is no need to return our call.  We will assume that you have returned to your regular daily activities without incident.  If any biopsies were taken you will be contacted by phone or by letter within the next 1-3 weeks.  Please call us at [phone] if you have not heard about the biopsies in 3 weeks.    SIGNATURES/CONFIDENTIALITY: You and/or your care partner have signed paperwork which will be entered into your electronic medical record.  These signatures attest to the fact that that the information above on your After Visit Summary has been reviewed and is understood.  Full responsibility of the confidentiality of this discharge information lies with you and/or your care-partner.

## 2016-10-21 ENCOUNTER — Telehealth: Payer: Self-pay | Admitting: *Deleted

## 2016-10-21 DIAGNOSIS — Z860100 Personal history of colon polyps, unspecified: Secondary | ICD-10-CM

## 2016-10-21 HISTORY — DX: Personal history of colon polyps, unspecified: Z86.0100

## 2016-10-21 HISTORY — DX: Personal history of colonic polyps: Z86.010

## 2016-10-21 NOTE — Telephone Encounter (Signed)
  Follow up Call-  Call back number 10/20/2016 03/13/2015  Post procedure Call Back phone  # [phone]  Permission to leave phone message Yes Yes  Some recent data might be hidden     Patient questions:  Do you have a fever, pain , or abdominal swelling? No. Pain Score  0 *  Have you tolerated food without any problems? Yes.    Have you been able to return to your normal activities? Yes.    Do you have any questions about your discharge instructions: Diet   No. Medications  No. Follow up visit  No.  Do you have questions or concerns about your Care? No.  Actions: * If pain score is 4 or above: No action needed, pain <4.

## 2016-10-26 ENCOUNTER — Encounter: Payer: Self-pay | Admitting: Internal Medicine

## 2016-10-26 NOTE — Progress Notes (Signed)
"  early hyperplastic" diminutive cecal polyp All other biopsies no colitis Routine repeat colonoscopy in 3 years OV 1 year My Chart message sent no letter

## 2016-12-06 ENCOUNTER — Telehealth: Payer: Self-pay | Admitting: Neurology

## 2016-12-06 NOTE — Telephone Encounter (Addendum)
Spoke to his wife - she is requesting he be seen in the office - he has been worked into Dr. Rhea Belton schedule on 12/07/16.

## 2016-12-06 NOTE — Telephone Encounter (Signed)
Pt's wife called said his vision has changed in the past 2 weeks. She said he has been complaining about it everyday. She said this has been since his last infusion around 1/28. She would like for him to be seen. Dr Krista Blue and Hoyle Sauer have no openings anytime soon. Please call

## 2016-12-07 ENCOUNTER — Encounter: Payer: Self-pay | Admitting: Neurology

## 2016-12-07 ENCOUNTER — Ambulatory Visit (INDEPENDENT_AMBULATORY_CARE_PROVIDER_SITE_OTHER): Payer: Medicare Other | Admitting: Neurology

## 2016-12-07 VITALS — BP 106/64 | HR 71 | Ht 71.0 in | Wt 204.0 lb

## 2016-12-07 DIAGNOSIS — G35 Multiple sclerosis: Secondary | ICD-10-CM | POA: Diagnosis not present

## 2016-12-07 DIAGNOSIS — G4700 Insomnia, unspecified: Secondary | ICD-10-CM | POA: Diagnosis not present

## 2016-12-07 DIAGNOSIS — H539 Unspecified visual disturbance: Secondary | ICD-10-CM | POA: Diagnosis not present

## 2016-12-07 MED ORDER — CLONAZEPAM 0.5 MG PO TABS: 1.0000 mg | ORAL_TABLET | Freq: Every evening | ORAL | 3 refills | Status: DC | PRN

## 2016-12-07 NOTE — Progress Notes (Signed)
[facility]  PATIENT: Jonathon Vazquez DOB: 1971-04-02   REASON FOR VISIT: Follow-up for multiple sclerosis, relapsing remitting HISTORY FROM: Patient    HISTORY OF PRESENT ILLNESS:Jonathon Vazquez 46 year-old black male returns today for followup of RRMS.  He was seen by his [facility] since 2000, initially presenting with facial numbness, MRI demonstrates lesions consistent with MS, however he has lost followups, was suggested Copaxone in 2004, but never followed through, he was reevaluated by Dr. Ron Parker in February of 2008, he began to have bladder incontinence, dizziness, blurry vision, unbalanced gait, repeat MRI at that time continued to demonstrate multiple foci of signal hyperintensity, involving brainstem, superior peduncle, deep white matters, there was pericallosal involvement, no enhancement, but there was positive DWIs lesion, he was started on Avenox since 06/13/2007,  he continued to have flulike illness following injection,  He also reported a history of Crohn's disease, last treatment was in 2004 with [place], he no longer has significant GI symptoms, He was treated with immunosuppressive treatment mercaptopurine in late 1990s, for 3-4 months, still had diahrrea.  He had one to 2 flareups each year,  presenting with increased dizziness, unbalanced gait, blurry vision, worsening memory trouble, often partial improvement with steroid package.  Most recent MRI cervical was in July 2011, remote demyelinating plaque at C2 and C4, no enhancing lesions.  Repeat MRI brain and cervical in Jan 2013 showed a defined hyperintensity in the spinal cord at C2 which likely represents remote age inactive demyelinating plaque. No enhancing lesions are noted. As compared with previous MRI dated 04/30/2010 the C4 spinal cord hyperintensity is no longer seen.  MRI scan of the brain showed multiple brainstem, cerebellar, corpus callosal and periventricular white matter hyperintensities  consistent with multiple sclerosis. No enhancing lesions are noted. Presence of T1 black holes  andd corpus callosal atrophy indicate chronic disease. Overall no significant changes compared with MRI from 01/23/2007   He is doing very well, there was no new neurological symptoms, he is taking Avonex, mild flulike illness right out of the injection, improved by Tylenol, or aleve.  He complains of frequent bilateral frontal headaches.  We have reviewed MRI of the brain, which has demonstrated multiple periventricular and subcortical and infratentorial chronic demyelinating plaques. No acute plaques. Compared to prior MRI on 01/23/07, there are has been disease progression.  He continues to do very well, reported prior to MRI scan, he has been off of his Avonex for 4 months, due to insurance reasons, after discussed with Jonathon Vazquez we decided to stay on current Avonex treatment, overall only slight change in his MRI scan there is no significant clinical worsening, we will repeat MRI of the brain and cervical spine with and without contrast in one year.  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 Carlean Purl, he has been treated with prednisone, mercaptopurine in 2011 157m daily for one year, ( which is a nucleoside metabolic inhibitor that results in cell-cycle arrest and death) and mesalamine in the past,   We decided not to proceed with [place]  Keep him on Avonex he complains of 3 weeks history of left arm numbness, since January 2015, has much improved now,    UPDATE 07/28/2014:YY He had laparoscopic surgery  in Sept 3rd 2015 at [facility], which did help him,   He has more memory loss. He also  complains of fatigue. No recurrent mass symptoms, he denied gait difficulty, no visual disorder  We have reviewed MRI of brain: scattered bilateral periventricular, subcortical, corpus callosum, cerebellar and brainstem white matter hyperintensities typical for demyelinating disease. No enhancing lesions are noted. The presence of T1 black holes and mild atrophy of corpus callosum indicates chronic disease. Compared with previous MRI scan dated 04/22/13 there appears to be slight disease activity progression   MRI scan of the cervical spine showing left lateral disc herniation at C5-6 with significant narrowing of the foramina. Spinal cord hyperintensities at C2, C4-5 and in the brainstem and cerebellum likely represent remote age demyelinating plaques. No enhancing lesions are noted.  UPDATE Jan 26th 2016:YY He complains of increased short term memory loss, no significant gait difficulty, no vision loss does complains of fatigue, especially with heat exposure We have reviewed most recent MRI of the brain in July 2016 with without contrast, in comparison to MRI of brain in 2008, and 2015, multiple rounded and ovoid, periventricular, subcortical, juxtacortical and infratentorial chronic demyelinating plaque, some T1 black holes, no abnormal enhancement, no change compared to previous scans  UPDATE Aug 20 2015:YY He went on missionary trip to [place] in Sep, he is tired of Avonex   UPDATE Sep 24 2015:YY He came in with his wife, and children at today's clinical visit, I have personally reviewed MRI of the film in 2016, in comparison with 2008, there was mild progression, he does has slow worsening mild gait difficulty, after extensive discussion, we decided proceed with Tysarbri IV infusion, he has history of Crohn's disease, low titer JC virus antibody 0.27 in July 2016, he has used mercaptopurine in 2011 110m daily for one year, both patient and his wife understand the potential risk and benefit,  less than 10/998 chance of PML  UPDATE Dec 07 2016: He has started [name] since December of 2016, on a monthly basis, overall has been doing very well, most recent MRI was in December 2017, no significant change compared to previous MRI in 2016,  He noticed visual trouble since early Feb 2018, he could no longer read clock that he used to read, difficulty focusing,  worse on left side.  He denies confusion, no headaches,  He also complains of new onset insomnia, difficulty falling sleep,  REVIEW OF SYSTEMS: Full 14 system review of systems performed and notable only for those listed, all others are neg: As above    ALLERGIES: Allergies  Allergen Reactions  . Lialda [Mesalamine] Diarrhea    Severe diarrhea    HOME MEDICATIONS: Outpatient Medications Prior to Visit  Medication Sig Dispense Refill  . acetaminophen (TYLENOL) 325 MG tablet Take 650 mg by mouth every 6 (six) hours as needed.    . clindamycin-benzoyl peroxide (BENZACLIN) gel Apply topically 2 (two) times daily. (Patient not taking: Reported on 10/20/2016) 50 g 0  . natalizumab (TYSABRI) 300 MG/15ML injection Inject into the vein.     Facility-Administered Medications Prior to Visit  Medication Dose Route Frequency Provider Last Rate Last Dose  . 0.9 %  sodium chloride infusion  500 mL Intravenous Continuous CGatha Mayer MD        PAST MEDICAL HISTORY: Past Medical History:  Diagnosis Date  . Anal fistula 1996  . Anal stenosis   . [place]    pt and wife deny this  .  Colon stricture    descending colon  . Crohn's disease ([place])   . Hx of colonic polyp 10/21/2016  . Multiple sclerosis ([place])   . Neuromuscular disorder ([place])   . Patent ductus arteriosus   . Regional enteritis of large intestine ([place])   . S/P left hemicolectomy 2015   [facility]-done for stricture  . Stricture of descending colon due to Crohn's disease 02/13/2012    PAST SURGICAL HISTORY: Past Surgical History:    Procedure Laterality Date  . COLON SURGERY  2015   descending colon stricture   . COLONOSCOPY     multiple    FAMILY HISTORY: Family History  Problem Relation Age of Onset  . Ulcerative colitis Father   . Heart disease Mother   . Prostate cancer Paternal Grandfather   . Diabetes Paternal Grandmother   . Colon cancer Neg Hx   . Rectal cancer Neg Hx   . Stomach cancer Neg Hx     SOCIAL HISTORY: Social History   Social History  . Marital status: Married    Spouse name: [name]  . Number of children: 3  . Years of education: 50   Occupational History  . disabled    Social History Main Topics  . Smoking status: Former Smoker    Types: Cigarettes    Quit date: 10/24/2004  . Smokeless tobacco: Never Used  . Alcohol use No  . Drug use: No  . Sexual activity: Not on file   Other Topics Concern  . Not on file   Social History Narrative   Married, 1 son one daughter. 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 Multimedia programmer)   Disabled, previously worked at [employer], disability due to multiple sclerosis   Caffeine one daily   Left handed.   Education- one year of college.     PHYSICAL EXAM  Vitals:   12/07/16 1606  BP: 106/64  Pulse: 71  Weight: 204 lb (92.5 kg)  Height: 5' 11"  (1.803 m)   Body mass index is 28.45 kg/m.  Generalized: Well developed, in no acute distress  Head: normocephalic and atraumatic,. Oropharynx benign  Neck: Supple, no carotid bruits  Cardiac: Regular rate rhythm, no murmur  Musculoskeletal: No deformity   Neurological examination   Mentation: Alert oriented to time, place, history taking. Attention span and concentration appropriate. Recent and remote memory intact.  Follows all commands speech and language fluent.   Cranial nerve II-XII: Fundoscopic exam reveals sharp disc margins.Pupils were equal round reactive to light extraocular movements were full, visual field were full on confrontational test.Right visual acuity 20/30, left side 20/50 Facial sensation  and strength were normal. hearing was intact to finger rubbing bilaterally. Uvula tongue midline. head turning and shoulder shrug were normal and symmetric.Tongue protrusion into cheek strength was normal. Motor: normal bulk and tone, full strength in the BUE, BLE, fine finger movements normal, no pronator drift. No focal weakness Sensory: normal and symmetric to light touch, pinprick, and  Vibration,In the upper and lower extremities  Coordination: finger-nose-finger, heel-to-shin bilaterally, no dysmetria Reflexes: Brachioradialis 2/2, biceps 2/2, triceps 2/2, patellar 2/2, Achilles 2/2, plantar responses were flexor bilaterally. Gait and Station: Rising up from seated position without assistance, normal stance,  moderate stride, good arm swing, smooth turning, able to perform tiptoe, and heel walking without difficulty. Tandem gait is mildly unsteady , no assistive device  DIAGNOSTIC DATA (LABS, IMAGING, TESTING) - I reviewed patient records, labs, notes, testing and imaging myself where available.  Lab Results  Component Value Date   WBC  6.4 09/29/2016   HGB 14.2 09/29/2016   HCT 42.4 09/29/2016   MCV 83.6 09/29/2016   PLT 144 09/29/2016      Component Value Date/Time   NA 140 09/29/2016 1100   NA 139 11/18/2014 1308   K 4.1 09/29/2016 1100   CL 104 09/29/2016 1100   CO2 25 09/29/2016 1100   GLUCOSE 84 09/29/2016 1100   BUN 16 09/29/2016 1100   BUN 10 11/18/2014 1308   CREATININE 1.13 09/29/2016 1100   CALCIUM 9.0 09/29/2016 1100   PROT 7.1 09/29/2016 1100   PROT 7.2 11/18/2014 1308   ALBUMIN 4.2 09/29/2016 1100   ALBUMIN 4.4 11/18/2014 1308   AST 19 09/29/2016 1100   ALT 13 09/29/2016 1100   ALKPHOS 70 09/29/2016 1100   BILITOT 1.2 09/29/2016 1100   GFRNONAA 78 09/29/2016 1100   GFRAA >89 09/29/2016 1100   Lab Results  Component Value Date   CHOL 183 09/29/2016   HDL 45 09/29/2016   LDLCALC 117 (H) 09/29/2016   TRIG 103 09/29/2016   CHOLHDL 4.1 09/29/2016    Lab  Results  Component Value Date   TSH 1.45 09/29/2016      ASSESSMENT AND PLAN JEMMIE RHINEHART is 46 years old male Relapsing remitting multiple sclerosis  Keep Tysarbri infusion   New-onset worsening blurry vision  Differentiation diagnosis includes age related changes versus optic neuritis, left worse than right, left visual acuity  He had a history of immunosuppressive treatment for his Crohn's disease mercaptopurine in 2011 146m daily for one year, ( which is a nucleoside metabolic inhibitor that results in cell-cycle arrest and death), negative JC virus antibody but still at the high risk for the potential PML,  Repeat MRI of the brain with and without contrast,  Tysarbri antibody, JC virus antibody   I also advised him to see his optometrist, call back clinic for worsening visual loss  Will hold of steroid treatment at this point  New-onset insomnia  Clonazepam 0.5 milligram as needed  YMarcial Pacas M.D. Ph.D.  [facility] [address] [place] [address]Phone: [phone]:      [phone]

## 2016-12-15 LAB — ANTI-TYSABRI (NATALIZUMAB) AB: ANTI-TYSABRI(NATALIZUMAB) AB: NEGATIVE

## 2016-12-19 ENCOUNTER — Telehealth: Payer: Self-pay | Admitting: *Deleted

## 2016-12-19 NOTE — Telephone Encounter (Signed)
Labs collected 12/07/16:  JCV 0.26 Indeterminate

## 2016-12-20 ENCOUNTER — Ambulatory Visit
Admission: RE | Admit: 2016-12-20 | Discharge: 2016-12-20 | Disposition: A | Discharge status: Home / Self Care | Payer: Medicare Other | Source: Ambulatory Visit | Attending: Neurology | Admitting: Neurology

## 2016-12-20 DIAGNOSIS — G35 Multiple sclerosis: Secondary | ICD-10-CM | POA: Diagnosis not present

## 2016-12-20 DIAGNOSIS — H539 Unspecified visual disturbance: Secondary | ICD-10-CM

## 2016-12-20 MED ORDER — GADOBENATE DIMEGLUMINE 529 MG/ML IV SOLN
20.0000 mL | Freq: Once | INTRAVENOUS | Status: AC | PRN
  Administered 2016-12-20: 20 mL via INTRAVENOUS

## 2017-02-06 ENCOUNTER — Ambulatory Visit: Payer: Medicare Other | Admitting: Neurology

## 2017-02-09 ENCOUNTER — Ambulatory Visit (INDEPENDENT_AMBULATORY_CARE_PROVIDER_SITE_OTHER): Payer: Medicare Other | Admitting: Nurse Practitioner

## 2017-02-09 ENCOUNTER — Encounter: Payer: Self-pay | Admitting: Nurse Practitioner

## 2017-02-09 VITALS — BP 119/73 | HR 86 | Wt 208.0 lb

## 2017-02-09 DIAGNOSIS — G35 Multiple sclerosis: Secondary | ICD-10-CM | POA: Diagnosis not present

## 2017-02-09 DIAGNOSIS — Z5181 Encounter for therapeutic drug level monitoring: Secondary | ICD-10-CM

## 2017-02-09 NOTE — Progress Notes (Signed)
[facility]  PATIENT: Jonathon Vazquez DOB: 08/20/1971   REASON FOR VISIT: Follow-up for multiple sclerosis, relapsing remitting HISTORY FROM: Patient    HISTORY OF PRESENT ILLNESS:He was seen by his [facility] since 2000, initially presenting with facial numbness, MRI demonstrates lesions consistent with MS, however he has lost followups, was suggested Copaxone in 2004, but never followed through, he was reevaluated by Dr. Ron Parker in February of 2008, he began to have bladder incontinence, dizziness, blurry vision, unbalanced gait, repeat MRI at that time continued to demonstrate multiple foci of signal hyperintensity, involving brainstem, superior peduncle, deep white matters, there was pericallosal involvement, no enhancement, but there was positive DWIs lesion, he was started on Avenox since 06/13/2007, he continued to have flulike illness following injection,  He also reported a history of Crohn's disease, last treatment was in 2004 with [place], he no longer has significant GI symptoms, He was treated with immunosuppressive treatment mercaptopurine in late 1990s, for 3-4 months, still had diahrrea.  He had one to 2 flareups each year, presenting with increased dizziness, unbalanced gait, blurry vision, worsening memory trouble, often partial improvement with steroid package.  Most recent MRI cervical was in July 2011, remote demyelinating plaque at C2 and C4, no enhancing lesions.  Repeat MRI brain and cervical in Jan 2013 showed a defined hyperintensity in the spinal cord at C2 which likely represents remote age inactive demyelinating plaque. No enhancing lesions are noted. As compared with previous MRI dated 04/30/2010 the C4 spinal cord hyperintensity is no longer seen.  MRI scan of the brain showed multiple brainstem, cerebellar, corpus callosal and periventricular white matter hyperintensities consistent with multiple sclerosis. No enhancing lesions are noted. Presence  of T1 black holes andd corpus callosal atrophy indicate chronic disease. Overall no significant changes compared with MRI from 01/23/2007   He is doing very well, there was no new neurological symptoms, he is taking Avonex, mild flulike illness right out of the injection, improved by Tylenol, or aleve. He complains of frequent bilateral frontal headaches.  We have reviewed MRI of the brain,which has demonstratedmultiple periventricular and subcortical and infratentorial chronic demyelinating plaques. No acute plaques. Compared to prior MRI on 01/23/07, there are has been disease progression.  He continues to do very well, reported prior to MRI scan, he has been off of his Avonex for 4 months, due to insurance reasons, after discussed with Mr. Calvin we decided to stay on current Avonex treatment, overall only slight change in his MRI scan there is no significant clinical worsening, we will repeat MRI of the brain and cervical spine with and without contrast in one year.  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 Carlean Purl, he has been treated with prednisone, mercaptopurine in 2011 156m daily for one year, ( which is a nucleoside metabolic inhibitor that results in cell-cycle arrest and death)and mesalamine in the past,  We decided not to proceed with [place] Keep him on Avonex he complains of 3 weeks history of left arm numbness, since January 2015, has much improved now,   UPDATE 07/28/2014:YY [facility],   He has more memory loss. He also complains of fatigue. No recurrent mass  symptoms, he denied gait difficulty, no  visual disorder  We have reviewed MRI of brain: scattered bilateral periventricular, subcortical, corpus callosum, cerebellar and brainstem white matter hyperintensities typical for demyelinating disease. No enhancing lesions are noted. The presence of T1 black holes and mild atrophy of corpus callosum indicates chronic disease. Compared with previous MRI scan dated 04/22/13 there appears to be slight disease activity progression   MRI scan of the cervical spine showing left lateral disc herniation at C5-6 with significant narrowing of the foramina. Spinal cord hyperintensities at C2, C4-5 and in the brainstem and cerebellum likely represent remote age demyelinating plaques. No enhancing lesions are noted.  UPDATE Jan 26th 2016:YY He complains of increased short term memory loss, no significant gait difficulty, no vision loss does complains of fatigue, especially with heat exposure We have reviewed most recent MRI of the brain in July 2016 with without contrast, in comparison to MRI of brain in 2008, and 2015, multiple rounded and ovoid, periventricular, subcortical, juxtacortical and infratentorial chronic demyelinating plaque, some T1 black holes, no abnormal enhancement, no change compared to previous scans  UPDATE Sep 24 2015:YY He came in with his wife, and children at today's clinical visit, I have personally reviewed MRI of the film in 2016, in comparison with 2008, there was mild progression, he does has slow worsening mild gait difficulty, after extensive discussion, we decided proceed with Tysarbri IV infusion, he has history of Crohn's disease, low titer JC virus antibody 0.27 in July 2016, he has used mercaptopurine in 2011 148m daily for one year, both patient and his wife understand the potential risk and benefit, less than 10/998 chance of PML  UPDATE Dec 07 2016: He has [name] since December of 2016, on a monthly basis, overall has been doing very well, most  recent MRI was in December 2017, no significant change compared to previous MRI in 2016, He noticed visual trouble since early Feb 2018, he could no longer read clock that he used to read, difficulty focusing,  worse on left side. He denies confusion, no headaches, He also complains of new onset insomnia, difficulty falling sleep,  UPDATE 4/19/ 2018CM Jonathon Vazquez 46year old male returns for follow-up history of relapsing remitting multiple sclerosis. He is currently on Tysabri, had his infusion yesterday. MRI of the brain 02/27/2018Abnormal MRI brain (with and without) demonstrating: 1. Round and ovoid, periventricular, subcortical, middle cerebellar peduncle, left cerebellar chronic demyelinating plaques.  2. No abnormal lesions are seen on post contrast views.   3. No change from MRI on 10/13/16.  JC antibody collected 12/07/2016. Indeterminate 0.26. He continues to have some visual symptoms, difficulty focusing. He has not had an eye exam in quite some time. Klonopin has worked well for his insomnia. He denies any falls, any new weakness sensory changes. He denies any speech or swallowing difficulty, any problems with bowel or bladder control. He continues to go to the gym every week. He returns for reevaluation  REVIEW OF SYSTEMS: Full 14 system review of systems performed and notable only for those listed, all others are neg:  Constitutional: neg  Cardiovascular: neg Ear/Nose/Throat: neg  Skin: neg Eyes: Blurred vision Respiratory: neg Gastroitestinal: neg  Hematology/Lymphatic: neg  Endocrine: neg Musculoskeletal:neg Allergy/Immunology: neg Neurological: neg Psychiatric: neg Sleep : neg   ALLERGIES: Allergies  Allergen Reactions  . Lialda [Mesalamine] Diarrhea    Severe diarrhea    HOME MEDICATIONS: Outpatient Medications Prior to Visit  Medication Sig Dispense Refill  . acetaminophen (TYLENOL) 325  MG tablet Take 650 mg by mouth every 6 (six) hours as needed.    .  clindamycin-benzoyl peroxide (BENZACLIN) gel Apply topically 2 (two) times daily. 50 g 0  . clonazePAM (KLONOPIN) 0.5 MG tablet Take 2 tablets (1 mg total) by mouth at bedtime as needed for anxiety. 69 tablet 3  . natalizumab (TYSABRI) 300 MG/15ML injection Inject into the vein.     No facility-administered medications prior to visit.     PAST MEDICAL HISTORY: Past Medical History:  Diagnosis Date  . Anal fistula 1996  . Anal stenosis   . Atrial fibrillation ([place])    pt and wife deny this  . Colon stricture ([facility])    descending colon  . Crohn's disease ([place])   . Hx of colonic polyp 10/21/2016  . Multiple sclerosis ([place])   . Neuromuscular disorder ([place])   . Patent ductus arteriosus   . Regional enteritis of large intestine ([place])   . S/P left hemicolectomy 2015   [facility]-done for stricture  . 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 COLON SURGERY  2015   descending colon stricture   . COLONOSCOPY     multiple    FAMILY HISTORY: Family History  Problem Relation Age of Onset  . Ulcerative colitis Father   . Heart disease Mother   . Prostate cancer Paternal Grandfather   . Diabetes Paternal Grandmother   . Colon cancer Neg Hx   . Rectal cancer Neg Hx   . Stomach cancer Neg Hx     SOCIAL HISTORY: Social History   Social History  . Marital status: Married    Spouse name: [name]  . Number of children: 3  . Years of education: 31   Occupational History  . disabled    Social History Main Topics  . Smoking status: Former Smoker    Types: Cigarettes    Quit date: 10/24/2004  . Smokeless tobacco: Never Used  . Alcohol use No  . Drug use: No  . Sexual activity: Not on file   Other Topics Concern  . Not on file   Social History Narrative   Married, 1 son one daughter. 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 Multimedia programmer)   Disabled, previously worked at [employer], disability due to multiple  sclerosis   Caffeine one daily   Left handed.   Education- one year of college.     PHYSICAL EXAM  Vitals:   02/09/17 1459  BP: 119/73  Pulse: 86  Weight: 208 lb (94.3 kg)   Body mass index is 29.01 kg/m.  Generalized: Well developed, in no acute distress  Head: normocephalic and atraumatic,. Oropharynx benign  Neck: Supple, no carotid bruits  Cardiac: Regular rate rhythm, no murmur  Musculoskeletal: No deformity   Neurological examination   Mentation: Alert oriented to time, place, history taking. Attention span and concentration appropriate. Recent and remote memory intact.  Follows all commands speech and language fluent.   Cranial nerve II-XII: Visual acuity 20/50 right and 20/70 left Fundoscopic exam reveals sharp disc margins.Pupils were equal round reactive to light extraocular movements were full, visual field were full on confrontational test. Facial sensation and strength were normal. hearing was intact to finger rubbing bilaterally. Uvula tongue midline. head turning and shoulder shrug were normal and symmetric.Tongue protrusion into cheek strength was normal. Motor: normal bulk and tone, full strength in the BUE, BLE, fine finger movements normal, no pronator drift. No  focal weakness Sensory: normal and symmetric to light touch, pinprick, and  Vibration, in the upper and lower extremities  Coordination: finger-nose-finger, heel-to-shin bilaterally, no dysmetria Reflexes: Brachioradialis 2/2, biceps 2/2, triceps 2/2, patellar 2/2, Achilles 2/2, plantar responses were flexor bilaterally. Gait and Station: Rising up from seated position without assistance, normal stance,  moderate stride, good arm swing, smooth turning, able to perform tiptoe, and heel walking without difficulty. Tandem gait mildly unsteady  DIAGNOSTIC DATA (LABS, IMAGING, TESTING) - I reviewed patient records, labs, notes, testing and imaging myself where available.  Lab Results  Component Value Date     WBC 6.4 09/29/2016   HGB 14.2 09/29/2016   HCT 42.4 09/29/2016   MCV 83.6 09/29/2016   PLT 144 09/29/2016      Component Value Date/Time   NA 140 09/29/2016 1100   NA 139 11/18/2014 1308   K 4.1 09/29/2016 1100   CL 104 09/29/2016 1100   CO2 25 09/29/2016 1100   GLUCOSE 84 09/29/2016 1100   BUN 16 09/29/2016 1100   BUN 10 11/18/2014 1308   CREATININE 1.13 09/29/2016 1100   CALCIUM 9.0 09/29/2016 1100   PROT 7.1 09/29/2016 1100   PROT 7.2 11/18/2014 1308   ALBUMIN 4.2 09/29/2016 1100   ALBUMIN 4.4 11/18/2014 1308   AST 19 09/29/2016 1100   ALT 13 09/29/2016 1100   ALKPHOS 70 09/29/2016 1100   BILITOT 1.2 09/29/2016 1100   GFRNONAA 78 09/29/2016 1100   GFRAA >89 09/29/2016 1100   Lab Results  Component Value Date   CHOL 183 09/29/2016   HDL 45 09/29/2016   LDLCALC 117 (H) 09/29/2016   TRIG 103 09/29/2016   CHOLHDL 4.1 09/29/2016    Lab Results  Component Value Date   TSH 1.45 09/29/2016      ASSESSMENT AND PLAN  46 y.o. year old male  has a past medical history Relapsing remitting multiple sclerosis currently on. Tysabri monthly . Continued blurry vision with differential diagnosis of age related changes versus optic neuritis.He had a history of immunosuppressive treatment for his Crohn's disease mercaptopurine in 2011 166m daily for one year, ( which is a nucleoside metabolic inhibitor that results in cell-cycle arrest and death), negative JC virus antibody but still at the high risk for the potential PML, most recent MRI of the brain 12/20/16  without change.  JC antibody collected 12/07/2016. Indeterminate 0.26.  Continue Tysabri monthly CBC today to monitor adverse effects Need to see ophthalmologist for vision problems Continue clonazepam as needed for insomnia Follow-up in 6 months I spent 25 min  in total face to face time with the patient more than 50% of which was spent counseling and coordination of care, reviewing test results reviewing medications  and discussing and reviewing the diagnosis of MS  and further treatment options. , NRayburn Ma [facility] APRN  [facility] [address] [place] [phone]

## 2017-02-09 NOTE — Patient Instructions (Addendum)
Continue Tysabri monthly CBC today to monitor adverse effects Need to see ophthalmologist for vision problems Continue clonazepam as needed for insomnia Follow-up in 4 months

## 2017-02-10 LAB — CBC WITH DIFFERENTIAL/PLATELET
BASOS ABS: 0 10*3/uL (ref 0.0–0.2)
BASOS: 1 %
EOS (ABSOLUTE): 0.3 10*3/uL (ref 0.0–0.4)
EOS: 3 %
HEMATOCRIT: 41.2 % (ref 37.5–51.0)
HEMOGLOBIN: 13.8 g/dL (ref 13.0–17.7)
IMMATURE GRANS (ABS): 0.1 10*3/uL (ref 0.0–0.1)
Immature Granulocytes: 1 %
LYMPHS ABS: 3.3 10*3/uL — AB (ref 0.7–3.1)
Lymphs: 40 %
MCH: 27.6 pg (ref 26.6–33.0)
MCHC: 33.5 g/dL (ref 31.5–35.7)
MCV: 82 fL (ref 79–97)
MONOCYTES: 9 %
Monocytes Absolute: 0.8 10*3/uL (ref 0.1–0.9)
NEUTROS ABS: 3.9 10*3/uL (ref 1.4–7.0)
Neutrophils: 46 %
Platelets: 129 10*3/uL — ABNORMAL LOW (ref 150–379)
RBC: 5 x10E6/uL (ref 4.14–5.80)
RDW: 14.4 % (ref 12.3–15.4)
WBC: 8.3 10*3/uL (ref 3.4–10.8)

## 2017-02-13 ENCOUNTER — Telehealth: Payer: Self-pay | Admitting: *Deleted

## 2017-02-13 NOTE — Telephone Encounter (Signed)
Per Daun Peacock, NP, spoke with patient and informed him that NP reviewed his CBC results with Dr. Krista Blue. His CBC is stable. Patient verbalized understanding, appreciation.

## 2017-02-15 NOTE — Progress Notes (Signed)
I have reviewed and agreed above plan. 

## 2017-02-23 ENCOUNTER — Ambulatory Visit: Payer: Self-pay | Admitting: Nurse Practitioner

## 2017-03-31 ENCOUNTER — Ambulatory Visit
Admission: RE | Admit: 2017-03-31 | Discharge: 2017-03-31 | Disposition: A | Discharge status: Home / Self Care | Payer: Medicare Other | Source: Ambulatory Visit | Attending: Family Medicine | Admitting: Family Medicine

## 2017-03-31 ENCOUNTER — Ambulatory Visit (INDEPENDENT_AMBULATORY_CARE_PROVIDER_SITE_OTHER): Payer: Medicare Other | Admitting: Family Medicine

## 2017-03-31 ENCOUNTER — Encounter: Payer: Self-pay | Admitting: Family Medicine

## 2017-03-31 VITALS — BP 120/74 | HR 60 | Temp 97.8°F | Resp 14 | Ht 71.0 in | Wt 201.0 lb

## 2017-03-31 DIAGNOSIS — M7989 Other specified soft tissue disorders: Secondary | ICD-10-CM

## 2017-03-31 LAB — CBC WITH DIFFERENTIAL/PLATELET
BASOS PCT: 1 %
Basophils Absolute: 61 cells/uL (ref 0–200)
Eosinophils Absolute: 244 cells/uL (ref 15–500)
Eosinophils Relative: 4 %
HCT: 41.6 % (ref 38.5–50.0)
Hemoglobin: 13.9 g/dL (ref 13.0–17.0)
LYMPHS PCT: 41 %
Lymphs Abs: 2501 cells/uL (ref 850–3900)
MCH: 27.3 pg (ref 27.0–33.0)
MCHC: 33.4 g/dL (ref 32.0–36.0)
MCV: 81.6 fL (ref 80.0–100.0)
MONOS PCT: 9 %
MPV: 10.4 fL (ref 7.5–12.5)
Monocytes Absolute: 549 cells/uL (ref 200–950)
NEUTROS ABS: 2745 {cells}/uL (ref 1500–7800)
Neutrophils Relative %: 45 %
PLATELETS: 163 10*3/uL (ref 140–400)
RBC: 5.1 MIL/uL (ref 4.20–5.80)
RDW: 14.4 % (ref 11.0–15.0)
WBC: 6.1 10*3/uL (ref 3.8–10.8)

## 2017-03-31 NOTE — Progress Notes (Signed)
   Subjective:    Patient ID: Jonathon Vazquez, male    DOB: 25-Dec-1970, 46 y.o.   MRN: [id number]  Patient presents for R Index Finger Pain / Swelling (x3 weeks- states that index finger swells and has pain )   Pt here with swelling index finger-Probably 2 or 3 weeks ago he looked down and noticed swelling of his index finger is right-handed. He does not have any particular injury. He has some soreness initially but now has pain when he been sick joint. He's not had any swelling in any other joints of his body. He has not had any change in his medications with regards to his multiple sclerosis. There is no family history of any autoimmune arthritis. He's been taking Tylenol for pain. He states he is able to take anti-inflammatories if needed   Review Of Systems:  GEN- denies fatigue, fever, weight loss,weakness, recent illness HEENT- denies eye drainage, change in vision, nasal discharge, CVS- denies chest pain, palpitations RESP- denies SOB, cough, wheeze MSK- + joint pain, muscle aches, injury        Objective:    BP 120/74   Pulse 60   Temp 97.8 F (36.6 C) (Oral)   Resp 14   Ht _0  (1.803 m)   Wt 201 lb (91.2 kg)   SpO2 99%   BMI 28.03 kg/m  GEN- NAD, alert and oriented x3 MSK- bilat elbows/wrist, normal ROM, normal apperance, left hand, normal apperance, normal grasp, able to make fist bilat  Right hand index finger TTP and swelling at MIP, fair ROM of index finger  Pulse- Radial 2+        Assessment & Plan:      Problem List Items Addressed This Visit    None    Visit Diagnoses    Swelling of right index finger    -  Primary   Concern for spontaenous joint swelling he is immunocompromised, no sign of infection, he has autoimmune disoder with MS and Crohns. Check ESR/SED, RF, obtain xray to r/o bony pathology be he denies any recent injury   Relevant Orders   DG Finger Index Right   CBC with Differential/Platelet   Sedimentation Rate   C-reactive protein   Rheumatoid factor      Note: This dictation was prepared with Dragon dictation along with smaller phrase technology. Any transcriptional errors that result from this process are unintentional.

## 2017-03-31 NOTE — Patient Instructions (Addendum)
Go [facility] on [address] for xray We will call with lab results  F/u PENDING RESULTS

## 2017-04-01 LAB — SEDIMENTATION RATE: SED RATE: 4 mm/h (ref 0–15)

## 2017-04-04 LAB — C-REACTIVE PROTEIN: CRP: 9.7 mg/L — ABNORMAL HIGH (ref <8.0)

## 2017-04-04 LAB — RHEUMATOID FACTOR

## 2017-04-05 ENCOUNTER — Ambulatory Visit: Payer: Medicare Other | Admitting: Nurse Practitioner

## 2017-04-12 ENCOUNTER — Telehealth: Payer: Self-pay | Admitting: Neurology

## 2017-04-12 NOTE — Telephone Encounter (Signed)
Provided information to [facility] who has access to his PA information.

## 2017-04-12 NOTE — Telephone Encounter (Signed)
Jonathon Vazquez from [facility] re: the authorization of Jonathon Vazquez she said the fax needs to be resent because the bottom half came in whitted out, please refax to [phone] questions [phone] xt [phone]

## 2017-04-20 ENCOUNTER — Telehealth: Payer: Self-pay | Admitting: *Deleted

## 2017-04-20 DIAGNOSIS — M2550 Pain in unspecified joint: Secondary | ICD-10-CM

## 2017-04-20 NOTE — Telephone Encounter (Signed)
Received call from patient.   Reports that he would like referral to rheumatology now. Orders placed.

## 2017-04-21 NOTE — Telephone Encounter (Signed)
noted 

## 2017-06-12 NOTE — Progress Notes (Signed)
[facility]  PATIENT: Jonathon Vazquez DOB: Sep 25, 1971   REASON FOR VISIT: Follow-up for multiple sclerosis, relapsing remitting HISTORY FROM: Patient    HISTORY OF PRESENT ILLNESS:He was seen by his [facility] since 2000, initially presenting with facial numbness, MRI demonstrates lesions consistent with MS, however he has lost followups, was suggested Copaxone in 2004, but never followed through, he was reevaluated by Dr. Ron Vazquez in February of 2008, he began to have bladder incontinence, dizziness, blurry vision, unbalanced gait, repeat MRI at that time continued to demonstrate multiple foci of signal hyperintensity, involving brainstem, superior peduncle, deep white matters, there was pericallosal involvement, no enhancement, but there was positive DWIs lesion, he was started on Avenox since 06/13/2007, he continued to have flulike illness following injection,  He also reported a history of Crohn's disease, last treatment was in 2004 with [place], he no longer has significant GI symptoms, He was treated with immunosuppressive treatment mercaptopurine in late 1990s, for 3-4 months, still had diahrrea.  He had one to 2 flareups each year, presenting with increased dizziness, unbalanced gait, blurry vision, worsening memory trouble, often partial improvement with steroid package.  Most recent MRI cervical was in July 2011, remote demyelinating plaque at C2 and C4, no enhancing lesions.  Repeat MRI brain and cervical in Jan 2013 showed a defined hyperintensity in the spinal cord at C2 which likely represents remote age inactive demyelinating plaque. No enhancing lesions are noted. As compared with previous MRI dated 04/30/2010 the C4 spinal cord hyperintensity is no longer seen.  MRI scan of the brain showed multiple brainstem, cerebellar, corpus callosal and periventricular white matter hyperintensities consistent with multiple sclerosis. No enhancing lesions are noted. Presence  of T1 black holes andd corpus callosal atrophy indicate chronic disease. Overall no significant changes compared with MRI from 01/23/2007   He is doing very well, there was no new neurological symptoms, he is taking Avonex, mild flulike illness right out of the injection, improved by Tylenol, or aleve. He complains of frequent bilateral frontal headaches.  We have reviewed MRI of the brain,which has demonstratedmultiple periventricular and subcortical and infratentorial chronic demyelinating plaques. No acute plaques. Compared to prior MRI on 01/23/07, there are has been disease progression.  He continues to do very well, reported prior to MRI scan, he has been off of his Avonex for 4 months, due to insurance reasons, after discussed with Jonathon Vazquez we decided to stay on current Avonex treatment, overall only slight change in his MRI scan there is no significant clinical worsening, we will repeat MRI of the brain and cervical spine with and without contrast in one year.  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 Carlean Vazquez, he has been treated with prednisone, mercaptopurine in 2011 188m daily for one year, ( which is a nucleoside metabolic inhibitor that results in cell-cycle arrest and death)and mesalamine in the past,  We decided not to proceed with Jonathon Vazquez Keep him on Avonex he complains of 3 weeks history of left arm numbness, since January 2015, has much improved now,   UPDATE 07/28/2014:Jonathon [facility]azquez He had laparoscopic surgery in Sept 3rd 2015 at [facility] which did help him,   He has more memory loss. He also complains of fatigue. No recurrent mass  symptoms, he denied gait difficulty, no  visual disorder  We have reviewed MRI of brain: scattered bilateral periventricular, subcortical, corpus callosum, cerebellar and brainstem white matter hyperintensities typical for demyelinating disease. No enhancing lesions are noted. The presence of T1 black holes and mild atrophy of corpus callosum indicates chronic disease. Compared with previous MRI scan dated 04/22/13 there appears to be slight disease activity progression   MRI scan of the cervical spine showing left lateral disc herniation at C5-6 with significant narrowing of the foramina. Spinal cord hyperintensities at C2, C4-5 and in the brainstem and cerebellum likely represent remote age demyelinating plaques. No enhancing lesions are noted.  UPDATE Jan 26th 2016:[facility]athon [facility]quez He complains of increased short term memory loss, no significant gait difficulty, no vision loss does complains of fatigue, especially with heat exposure We have reviewed most recent MRI of the brain in July 2016 with without contrast, in comparison to MRI of brain in 2008, and 2015, multiple rounded and ovoid, periventricular, subcortical, juxtacortical and infratentorial chronic demyelinating plaque, some T1 black holes, no abnormal enhancement, no change compared to previous scans  UPDATE Sep 24 2015:[facility]athon V[facility]quez He came in with his wife, and children at today's clinical visit, I have personally reviewed MRI of the film in 2016, in comparison with 2008, there was mild progression, he does has slow worsening mild gait difficulty, after extensive discussion, we decided proceed with Tysarbri IV infusion, he has history of Crohn's disease, low titer JC virus antibody 0.27 in July 2016, he has used mercaptopurine in 2011 141m daily for one year, both patient and his wife understand the potential risk and benefit, less than 10/998 chance of PML  UPDATE Dec 07 2016: He has started [name] since December of 2016, on a monthly basis, overall has been doing very well, most  recent MRI was in December 2017, no significant change compared to previous MRI in 2016, He noticed visual trouble since early Feb 2018, he could no longer read clock that he used to read, difficulty focusing,  worse on left side. He denies confusion, no headaches, He also complains of new onset insomnia, difficulty falling sleep,  UPDATE 4/19/ 2018CM Mr. SSliwa 46year old male returns for follow-up history of relapsing remitting multiple sclerosis. He is currently on Tysabri, had his infusion yesterday. MRI of the brain 02/27/2018Abnormal MRI brain (with and without) demonstrating: 1. Round and ovoid, periventricular, subcortical, middle cerebellar peduncle, left cerebellar chronic demyelinating plaques.  2. No abnormal lesions are seen on post contrast views.   3. No change from MRI on 10/13/16.  JC antibody collected 12/07/2016. Indeterminate 0.26. He continues to have some visual symptoms, difficulty focusing. He has not had an eye exam in quite some time. Klonopin has worked well for his insomnia. He denies any falls, any new weakness sensory changes. He denies any speech or swallowing difficulty, any problems with bowel or bladder control. He continues to go to the gym every week. He returns for reevaluation UPDATE  06/13/2017. CM Mr. SFriberg  46year old male returns for follow-up with history of relapsing remitting multiple sclerosis. He is currently on Tysabri last dose August 10. Next a September 7th.  Last MRI of the brain 12/20/2016 without change from previous 10/13/2016. Last JC antibody indeterminant. He has been doing well with his Tysabri infusions. He does complain with some difficulty focusing at times, denies visual problems speech or swallowing difficulty.  He denies any weakness sensory changes difficulty with bowel or bladder control .Klonopin continues to work well for  his insomnia. He returns for reevaluation  REVIEW OF SYSTEMS: Full 14 system review of systems performed and  notable only for those listed, all others are neg:  Constitutional: neg  Cardiovascular: neg Ear/Nose/Throat: neg  Skin: neg Eyes: Blurred vision Respiratory: neg Gastroitestinal: neg  Hematology/Lymphatic: neg  Endocrine: neg Musculoskeletal:neg Allergy/Immunology: neg Neurological: difficulty focusing Psychiatric: neg Sleep : neg   ALLERGIES: Allergies  Allergen Reactions  . Lialda [Mesalamine] Diarrhea    Severe diarrhea    HOME MEDICATIONS: Outpatient Medications Prior to Visit  Medication Sig Dispense Refill  . acetaminophen (TYLENOL) 325 MG tablet Take 650 mg by mouth every 6 (six) hours as needed.    . clonazePAM (KLONOPIN) 0.5 MG tablet Take 2 tablets (1 mg total) by mouth at bedtime as needed for anxiety. 69 tablet 3  . natalizumab (TYSABRI) 300 MG/15ML injection Inject into the vein.     No facility-administered medications prior to visit.     PAST MEDICAL HISTORY: Past Medical History:  Diagnosis Date  . Anal fistula 1996  . Anal stenosis   . Atrial fibrillation ([place])    pt and wife deny this  . Colon stricture ([facility])    descending colon  . Crohn's disease ([place])   . Hx of colonic polyp 10/21/2016  . Multiple sclerosis ([place])   . Neuromuscular disorder ([place])   . Patent ductus arteriosus   . Regional enteritis of large intestine ([place])   . S/P left hemicolectomy 2015   [facility]-done for stricture  . 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 COLON SURGERY  2015   descending colon stricture   . COLONOSCOPY     multiple    FAMILY HISTORY: Family History  Problem Relation Age of Onset  . Ulcerative colitis Father   . Heart disease Mother   . Prostate cancer Paternal Grandfather   . Diabetes Paternal Grandmother   . Colon cancer Neg Hx   . Rectal cancer Neg Hx   . Stomach cancer Neg Hx     SOCIAL HISTORY: Social History   Social History  .  Marital status: Married    Spouse name: [name]  . Number of children: 3  . Years of education: 72   Occupational History  . disabled    Social History Main Topics  . Smoking status: Former Smoker    Types: Cigarettes    Quit date: 10/24/2004  . Smokeless tobacco: Never Used  . Alcohol use No  . Drug use: No  . Sexual activity: Not on file   Other Topics Concern  . Not on file   Social History Narrative   Married, 1 son one daughter. 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 Multimedia programmer)   Disabled, previously worked at [employer], disability due to multiple sclerosis   Caffeine one daily   Left handed.   Education- one year of college.     PHYSICAL EXAM  Vitals:   06/13/17 1554  BP: 103/61  Pulse: 72  Weight: 203 lb 9.6 oz (92.4 kg)  Height: 5' 11"  (1.803 m)   Body mass index is 28.4 kg/m.  Generalized: Well developed, in no acute distress  Head: normocephalic and atraumatic,. Oropharynx benign  Neck: Supple, no carotid bruits  Cardiac: Regular rate rhythm, no murmur  Musculoskeletal: No deformity   Neurological examination   Mentation: Alert oriented to time, place, history taking. Attention span and concentration appropriate. Recent and remote memory intact.  Follows all commands speech and language fluent.   Cranial nerve II-XII: Visual acuity 20/40 right and 20/30 left Fundoscopic exam reveals sharp disc margins.Pupils were equal round reactive to light extraocular movements were full, visual field were full on confrontational test. Facial sensation and strength were normal. hearing was intact to finger rubbing bilaterally. Uvula tongue midline. head turning and shoulder shrug were normal and symmetric.Tongue protrusion into cheek strength was normal. Motor: normal bulk and tone, full strength in the BUE, BLE, fine finger movements normal, no pronator drift. No focal weakness Sensory: normal and symmetric to light touch, pinprick, and  Vibration, in the upper and lower extremities    Coordination: finger-nose-finger, heel-to-shin bilaterally, no dysmetria Reflexes: Brachioradialis 2/2, biceps 2/2, triceps 2/2, patellar 2/2, Achilles 2/2, plantar responses were flexor bilaterally. Gait and Station: Rising up from seated position without assistance, normal stance,  moderate stride, good arm swing, smooth turning, able to perform tiptoe, and heel walking without difficulty. Tandem gait mildly unsteady  DIAGNOSTIC DATA (LABS, IMAGING, TESTING) - I reviewed patient records, labs, notes, testing and imaging myself where available.  Lab Results  Component Value Date   WBC 6.1 03/31/2017   HGB 13.9 03/31/2017   HCT 41.6 03/31/2017   MCV 81.6 03/31/2017   PLT 163 03/31/2017      Component Value Date/Time   NA 140 09/29/2016 1100   NA 139 11/18/2014 1308   K 4.1 09/29/2016 1100   CL 104 09/29/2016 1100   CO2 25 09/29/2016 1100   GLUCOSE 84 09/29/2016 1100   BUN 16 09/29/2016 1100   BUN 10 11/18/2014 1308   CREATININE 1.13 09/29/2016 1100   CALCIUM 9.0 09/29/2016 1100   PROT 7.1 09/29/2016 1100   PROT 7.2 11/18/2014 1308   ALBUMIN 4.2 09/29/2016 1100   ALBUMIN 4.4 11/18/2014 1308   AST 19 09/29/2016 1100   ALT 13 09/29/2016 1100   ALKPHOS 70 09/29/2016 1100   BILITOT 1.2 09/29/2016 1100   GFRNONAA 78 09/29/2016 1100   GFRAA >89 09/29/2016 1100   Lab Results  Component Value Date   CHOL 183 09/29/2016   HDL 45 09/29/2016   LDLCALC 117 (H) 09/29/2016   TRIG 103 09/29/2016   CHOLHDL 4.1 09/29/2016    Lab Results  Component Value Date   TSH 1.45 09/29/2016      ASSESSMENT AND PLAN  46 y.o. year old male  has a past medical history Relapsing remitting multiple sclerosis currently on. Tysabri monthly . He had a history of immunosuppressive treatment for his Crohn's disease mercaptopurine in 2011 181m daily for one year, ( which is a nucleoside metabolic inhibitor that results in cell-cycle arrest and death), negative JC virus antibody but still at the  high risk for the potential PML, most recent MRI of the brain 12/20/16  without change.  JC antibody collected 12/07/2016. Indeterminate 0.26.  Continue Tysabri monthly CBC reviewed from 03/31/17. WNL  MRI of the brain with and without JCV,  And CMP labs today Continue clonazepam as needed for insomnia Follow-up in 6 months I spent 25 min  in total face to face time with the patient more than 50% of which was spent counseling and coordination of care, reviewing test results reviewing medications and discussing and reviewing the diagnosis of MS  and further treatment options. , NRayburn Ma BPlastic Surgical [facility] APRN  [facility] [address] [place] [phone]

## 2017-06-13 ENCOUNTER — Encounter: Payer: Self-pay | Admitting: Nurse Practitioner

## 2017-06-13 ENCOUNTER — Ambulatory Visit (INDEPENDENT_AMBULATORY_CARE_PROVIDER_SITE_OTHER): Payer: Medicare Other | Admitting: Nurse Practitioner

## 2017-06-13 VITALS — BP 103/61 | HR 72 | Ht 71.0 in | Wt 203.6 lb

## 2017-06-13 DIAGNOSIS — G35 Multiple sclerosis: Secondary | ICD-10-CM | POA: Diagnosis not present

## 2017-06-13 DIAGNOSIS — Z5181 Encounter for therapeutic drug level monitoring: Secondary | ICD-10-CM | POA: Insufficient documentation

## 2017-06-13 NOTE — Patient Instructions (Signed)
Continue Tysabri monthly CBC reviewed from 03/31/17.   MRI of the brain with and without JCV,  Tysabri antibody andCMP labs today Continue clonazepam as needed for insomnia Follow-up in 6 months

## 2017-06-14 LAB — COMPREHENSIVE METABOLIC PANEL
A/G RATIO: 1.6 (ref 1.2–2.2)
ALK PHOS: 84 IU/L (ref 39–117)
ALT: 17 IU/L (ref 0–44)
AST: 20 IU/L (ref 0–40)
Albumin: 4.4 g/dL (ref 3.5–5.5)
BILIRUBIN TOTAL: 1.1 mg/dL (ref 0.0–1.2)
BUN/Creatinine Ratio: 12 (ref 9–20)
BUN: 11 mg/dL (ref 6–24)
CHLORIDE: 99 mmol/L (ref 96–106)
CO2: 21 mmol/L (ref 20–29)
Calcium: 9 mg/dL (ref 8.7–10.2)
Creatinine, Ser: 0.95 mg/dL (ref 0.76–1.27)
GFR calc Af Amer: 110 mL/min/{1.73_m2} (ref >59)
GFR calc non Af Amer: 95 mL/min/{1.73_m2} (ref >59)
Globulin, Total: 2.7 g/dL (ref 1.5–4.5)
Glucose: 93 mg/dL (ref 65–99)
POTASSIUM: 4.2 mmol/L (ref 3.5–5.2)
Sodium: 138 mmol/L (ref 134–144)
Total Protein: 7.1 g/dL (ref 6.0–8.5)

## 2017-06-14 NOTE — Progress Notes (Signed)
I have reviewed and agreed above plan. 

## 2017-06-15 ENCOUNTER — Telehealth: Payer: Self-pay | Admitting: *Deleted

## 2017-06-15 NOTE — Telephone Encounter (Signed)
-----   Message from Dennie Bible, NP sent at 06/14/2017  8:16 AM EDT ----- CMP stable please call the patient

## 2017-06-15 NOTE — Telephone Encounter (Signed)
Spoke to pt and relayed that lab results (CMP) are stable.  JCV pending.  Will call when that is available.  He verbalized understanding.

## 2017-06-22 ENCOUNTER — Telehealth: Payer: Self-pay | Admitting: *Deleted

## 2017-06-22 NOTE — Telephone Encounter (Signed)
JCV on 06/13/17 0.20 Last JCV on 12/07/16 0.26

## 2017-06-22 NOTE — Telephone Encounter (Signed)
JCV negative Index 0.20.

## 2017-06-22 NOTE — Telephone Encounter (Signed)
Requested JCV results from [facility] on - line.  To fax results today.

## 2017-06-23 ENCOUNTER — Ambulatory Visit
Admission: RE | Admit: 2017-06-23 | Discharge: 2017-06-23 | Disposition: A | Discharge status: Home / Self Care | Payer: Medicare Other | Source: Ambulatory Visit | Attending: Nurse Practitioner | Admitting: Nurse Practitioner

## 2017-06-23 DIAGNOSIS — G35 Multiple sclerosis: Secondary | ICD-10-CM | POA: Diagnosis not present

## 2017-06-23 MED ORDER — GADOBENATE DIMEGLUMINE 529 MG/ML IV SOLN
20.0000 mL | Freq: Once | INTRAVENOUS | Status: AC | PRN
  Administered 2017-06-23: 20 mL via INTRAVENOUS

## 2017-06-27 ENCOUNTER — Telehealth: Payer: Self-pay | Admitting: *Deleted

## 2017-06-27 NOTE — Telephone Encounter (Signed)
LVM informing his MRI of the brain was without a definite interval change since his  previous MRI dated February 2018.  Left number for any questions.

## 2017-09-22 ENCOUNTER — Ambulatory Visit (INDEPENDENT_AMBULATORY_CARE_PROVIDER_SITE_OTHER): Payer: Medicare Other

## 2017-09-22 DIAGNOSIS — Z23 Encounter for immunization: Secondary | ICD-10-CM | POA: Diagnosis not present

## 2017-09-22 NOTE — Progress Notes (Signed)
patient was in office for flu vaccine patient received vaccine in left deltoid patient tolerated well

## 2017-12-15 ENCOUNTER — Ambulatory Visit: Payer: Medicare Other | Admitting: Nurse Practitioner

## 2018-01-25 ENCOUNTER — Ambulatory Visit: Payer: Medicare Other | Admitting: Nurse Practitioner

## 2018-02-02 ENCOUNTER — Ambulatory Visit: Payer: Medicare Other | Admitting: Nurse Practitioner

## 2018-02-02 NOTE — Progress Notes (Signed)
[facility]  PATIENT: Jonathon Vazquez DOB: 1971/08/18   REASON FOR VISIT: Follow-up for multiple sclerosis, relapsing remitting HISTORY FROM: Patient    HISTORY OF PRESENT ILLNESS:He was seen by his [facility] since 2000, initially presenting with facial numbness, MRI demonstrates lesions consistent with MS, however he has lost followups, was suggested Copaxone in 2004, but never followed through, he was reevaluated by Dr. Ron Parker in February of 2008, he began to have bladder incontinence, dizziness, blurry vision, unbalanced gait, repeat MRI at that time continued to demonstrate multiple foci of signal hyperintensity, involving brainstem, superior peduncle, deep white matters, there was pericallosal involvement, no enhancement, but there was positive DWIs lesion, he was started on Avenox since 06/13/2007, he continued to have flulike illness following injection,  He also reported a history of Crohn's disease, last treatment was in 2004 with [place], he no longer has significant GI symptoms, He was treated with immunosuppressive treatment mercaptopurine in late 1990s, for 3-4 months, still had diahrrea.  He had one to 2 flareups each year, presenting with increased dizziness, unbalanced gait, blurry vision, worsening memory trouble, often partial improvement with steroid package.  Most recent MRI cervical was in July 2011, remote demyelinating plaque at C2 and C4, no enhancing lesions.  Repeat MRI brain and cervical in Jan 2013 showed a defined hyperintensity in the spinal cord at C2 which likely represents remote age inactive demyelinating plaque. No enhancing lesions are noted. As compared with previous MRI dated 04/30/2010 the C4 spinal cord hyperintensity is no longer seen.  MRI scan of the brain showed multiple brainstem, cerebellar, corpus callosal and periventricular white matter hyperintensities consistent with multiple sclerosis. No enhancing lesions are noted. Presence  of T1 black holes andd corpus callosal atrophy indicate chronic disease. Overall no significant changes compared with MRI from 01/23/2007   He is doing very well, there was no new neurological symptoms, he is taking Avonex, mild flulike illness right out of the injection, improved by Tylenol, or aleve. He complains of frequent bilateral frontal headaches.  We have reviewed MRI of the brain,which has demonstratedmultiple periventricular and subcortical and infratentorial chronic demyelinating plaques. No acute plaques. Compared to prior MRI on 01/23/07, there are has been disease progression.  He continues to do very well, reported prior to MRI scan, he has been off of his Avonex for 4 months, due to insurance reasons, after discussed with Mr. Aburto we decided to stay on current Avonex treatment, overall only slight change in his MRI scan there is no significant clinical worsening, we will repeat MRI of the brain and cervical spine with and without contrast in one year.  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 Carlean Purl, he has been treated with prednisone, mercaptopurine in 2011 127m daily for one year, ( which is a nucleoside metabolic inhibitor that results in cell-cycle arrest and death)and mesalamine in the past,  We decided not to proceed with [place] Keep him on Avonex he complains of 3 weeks history of left arm numbness, since January 2015, has much improved now,   UPDATE 07/28/2014:YY He [facility],   He has more memory loss. He also complains of fatigue. No recurrent mass  symptoms, he denied gait difficulty, no  visual disorder  We have reviewed MRI of brain: scattered bilateral periventricular, subcortical, corpus callosum, cerebellar and brainstem white matter hyperintensities typical for demyelinating disease. No enhancing lesions are noted. The presence of T1 black holes and mild atrophy of corpus callosum indicates chronic disease. Compared with previous MRI scan dated 04/22/13 there appears to be slight disease activity progression   MRI scan of the cervical spine showing left lateral disc herniation at C5-6 with significant narrowing of the foramina. Spinal cord hyperintensities at C2, C4-5 and in the brainstem and cerebellum likely represent remote age demyelinating plaques. No enhancing lesions are noted.  UPDATE Jan 26th 2016:YY He complains of increased short term memory loss, no significant gait difficulty, no vision loss does complains of fatigue, especially with heat exposure We have reviewed most recent MRI of the brain in July 2016 with without contrast, in comparison to MRI of brain in 2008, and 2015, multiple rounded and ovoid, periventricular, subcortical, juxtacortical and infratentorial chronic demyelinating plaque, some T1 black holes, no abnormal enhancement, no change compared to previous scans  UPDATE Sep 24 2015:YY He came in with his wife, and children at today's clinical visit, I have personally reviewed MRI of the film in 2016, in comparison with 2008, there was mild progression, he does has slow worsening mild gait difficulty, after extensive discussion, we decided proceed with Tysarbri IV infusion, he has history of Crohn's disease, low titer JC virus antibody 0.27 in July 2016, he has used mercaptopurine in 2011 149m daily for one year, both patient and his wife understand the potential risk and benefit, less than 10/998 chance of PML  UPDATE Dec 07 2016: He has started [name] since December of 2016, on a monthly basis, overall has been doing very well, most  recent MRI was in December 2017, no significant change compared to previous MRI in 2016, He noticed visual trouble since early Feb 2018, he could no longer read clock that he used to read, difficulty focusing,  worse on left side. He denies confusion, no headaches, He also complains of new onset insomnia, difficulty falling sleep,  UPDATE 4/19/ 2018CM Jonathon Vazquez 47year old male returns for follow-up history of relapsing remitting multiple sclerosis. He is currently on Tysabri, had his infusion yesterday. MRI of the brain 02/27/2018Abnormal MRI brain (with and without) demonstrating: 1. Round and ovoid, periventricular, subcortical, middle cerebellar peduncle, left cerebellar chronic demyelinating plaques.  2. No abnormal lesions are seen on post contrast views.   3. No change from MRI on 10/13/16.  JC antibody collected 12/07/2016. Indeterminate 0.26. He continues to have some visual symptoms, difficulty focusing. He has not had an eye exam in quite some time. Klonopin has worked well for his insomnia. He denies any falls, any new weakness sensory changes. He denies any speech or swallowing difficulty, any problems with bowel or bladder control. He continues to go to the gym every week. He returns for reevaluation UPDATE  06/13/2017. CM Jonathon Vazquez  47year old male returns for follow-up with history of relapsing remitting multiple sclerosis. He is currently on Tysabri last dose August 10. Next a September 7th.  Last MRI of the brain 12/20/2016 without change from previous 10/13/2016. Last JC antibody indeterminant. He has been doing well with his Tysabri infusions. He does complain with some difficulty focusing at times, denies visual problems speech or swallowing difficulty.  He denies any weakness sensory changes difficulty with bowel or bladder control .Klonopin continues to work well for  his insomnia. He returns for reevaluation  UPDATE 4/15/2019CM Jonathon Vazquez, 47 year old male returns for  follow-up with history of relapsing remitting multiple sclerosis he is currently on December he last dose was February 02, 2018.  Last MRI of the brain with and without 8/31/ 2018This MRI of the brain with and without contrast shows the following: 1.    T2/FLAIR hyperintense foci in the cerebellum, cerebellar peduncles, brainstem, right cerebral peduncle, left thalamus and both hemispheres in a pattern and configuration consistent with chronic demyelinating plaque associated with multiple sclerosis. None of the foci appears to be acute. When compared to the MRI dated 12/20/2016, there is no definite interval change. 2.    There is a normal enhancement pattern.  Last JC antibody negative at 0 .20 He does complain with blurred vision and is due to see an ophthalmologist next month.  He denies any speech or swallowing problems no difficulty with bowel or bladder control.  He denies any sensory changes.  He denies any falls he continues to take clonazepam for insomnia as needed.  He returns for reevaluation REVIEW OF SYSTEMS: Full 14 system review of systems performed and notable only for those listed, all others are neg:  Constitutional: neg  Cardiovascular: neg Ear/Nose/Throat: neg  Skin: neg Eyes: Blurred vision Respiratory: neg Gastroitestinal: neg  Hematology/Lymphatic: neg  Endocrine: neg Musculoskeletal:neg Allergy/Immunology: neg Neurological: difficulty focusing Psychiatric: neg Sleep : neg   ALLERGIES: Allergies  Allergen Reactions  . Lialda [Mesalamine] Diarrhea    Severe diarrhea    HOME MEDICATIONS: Outpatient Medications Prior to Visit  Medication Sig Dispense Refill  . acetaminophen (TYLENOL) 325 MG tablet Take 650 mg by mouth every 6 (six) hours as needed.    . clonazePAM (KLONOPIN) 0.5 MG tablet Take 2 tablets (1 mg total) by mouth at bedtime as needed for anxiety. 69 tablet 3  . natalizumab (TYSABRI) 300 MG/15ML injection Inject into the vein.     No  facility-administered medications prior to visit.     PAST MEDICAL HISTORY: Past Medical History:  Diagnosis Date  . Anal fistula 1996  . Anal stenosis   . Atrial fibrillation ([place])    pt and wife deny this  . Colon stricture ([facility])    descending colon  . Crohn's disease ([place])   . Hx of colonic polyp 10/21/2016  . Multiple sclerosis ([place])   . Neuromuscular disorder ([place])   . Patent ductus arteriosus   . Regional enteritis of large intestine ([place])   . S/P left hemicolectomy 2015   [facility]-done for stricture  . 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 COLON SURGERY  2015   descending colon stricture   . COLONOSCOPY     multiple    FAMILY HISTORY: Family History  Problem Relation Age of Onset  . Ulcerative colitis Father   . Heart disease Mother   . Prostate cancer Paternal Grandfather   . Diabetes Paternal Grandmother   . Colon cancer Neg Hx   . Rectal cancer Neg Hx   . Stomach cancer Neg Hx     SOCIAL HISTORY: Social History   Socioeconomic History  . Marital status: Married    Spouse name: [name]  . Number of children: 3  . Years of education: 45  . Highest education level: Not on file  Occupational History  . Occupation: disabled  Social Needs  . Financial resource strain: Not on file  .  Food insecurity:    Worry: Not on file    Inability: Not on file  . Transportation needs:    Medical: Not on file    Non-medical: Not on file  Tobacco Use  . Smoking status: Former Smoker    Types: Cigarettes    Last attempt to quit: 10/24/2004    Years since quitting: 13.2  . Smokeless tobacco: Never Used  Substance and Sexual Activity  . Alcohol use: No  . Drug use: No  . Sexual activity: Not on file  Lifestyle  . Physical activity:    Days per week: Not on file    Minutes per session: Not on file  . Stress: Not on file  Relationships  . Social  connections:    Talks on phone: Not on file    Gets together: Not on file    Attends religious service: Not on file    Active member of club or organization: Not on file    Attends meetings of clubs or organizations: Not on file    Relationship status: Not on file  . Intimate partner violence:    Fear of current or ex partner: Not on file    Emotionally abused: Not on file    Physically abused: Not on file    Forced sexual activity: Not on file  Other Topics Concern  . Not on file  Social History Narrative   Married, 1 son one daughter. 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 Multimedia programmer)   Disabled, previously worked at [employer], disability due to multiple sclerosis   Caffeine one daily   Left handed.   Education- one year of college.     PHYSICAL EXAM  Vitals:   02/05/18 1531  BP: 120/65  Pulse: 68  Weight: 212 lb (96.2 kg)  Height: 5' 11"  (1.803 m)   Body mass index is 29.57 kg/m.  Generalized: Well developed, in no acute distress  Head: normocephalic and atraumatic,. Oropharynx benign  Neck: Supple,  Cardiac: Regular rate rhythm, no murmur  Musculoskeletal: No deformity   Neurological examination   Mentation: Alert oriented to time, place, history taking. Attention span and concentration appropriate. Recent and remote memory intact.  Follows all commands speech and language fluent.   Cranial nerve II-XII: Visual acuity 20/70 right and 20/50 left Fundoscopic exam reveals sharp disc margins.Pupils were equal round reactive to light extraocular movements were full, visual field were full on confrontational test. Facial sensation and strength were normal. hearing was intact to finger rubbing bilaterally. Uvula tongue midline. head turning and shoulder shrug were normal and symmetric.Tongue protrusion into cheek strength was normal. Motor: normal bulk and tone, full strength in the BUE, BLE, fine finger movements normal, no pronator drift. No focal weakness Sensory: normal and symmetric to light  touch, pinprick, and  Vibration, in the upper and lower extremities  Coordination: finger-nose-finger, heel-to-shin bilaterally, no dysmetria Reflexes: Symmetric upper and lower plantar responses were flexor bilaterally. Gait and Station: Rising up from seated position without assistance, normal stance,  moderate stride, good arm swing, smooth turning, able to perform tiptoe, and heel walking without difficulty. Tandem gait mildly unsteady .  No assistive device DIAGNOSTIC DATA (LABS, IMAGING, TESTING) - I reviewed patient records, labs, notes, testing and imaging myself where available.  Lab Results  Component Value Date   WBC 6.1 03/31/2017   HGB 13.9 03/31/2017   HCT 41.6 03/31/2017   MCV 81.6 03/31/2017   PLT 163 03/31/2017      Component Value Date/Time   NA  138 06/13/2017 1637   K 4.2 06/13/2017 1637   CL 99 06/13/2017 1637   CO2 21 06/13/2017 1637   GLUCOSE 93 06/13/2017 1637   GLUCOSE 84 09/29/2016 1100   BUN 11 06/13/2017 1637   CREATININE 0.95 06/13/2017 1637   CREATININE 1.13 09/29/2016 1100   CALCIUM 9.0 06/13/2017 1637   PROT 7.1 06/13/2017 1637   ALBUMIN 4.4 06/13/2017 1637   AST 20 06/13/2017 1637   ALT 17 06/13/2017 1637   ALKPHOS 84 06/13/2017 1637   BILITOT 1.1 06/13/2017 1637   GFRNONAA 95 06/13/2017 1637   GFRNONAA 78 09/29/2016 1100   GFRAA 110 06/13/2017 1637   GFRAA >89 09/29/2016 1100   Lab Results  Component Value Date   CHOL 183 09/29/2016   HDL 45 09/29/2016   LDLCALC 117 (H) 09/29/2016   TRIG 103 09/29/2016   CHOLHDL 4.1 09/29/2016    Lab Results  Component Value Date   TSH 1.45 09/29/2016      ASSESSMENT AND PLAN  47 y.o. year old male  has a past medical history Relapsing remitting multiple sclerosis currently on. Tysabri monthly . He had a history of immunosuppressive treatment for his Crohn's disease mercaptopurine in 2011 166m daily for one year, ( which is a nucleoside metabolic inhibitor that results in cell-cycle arrest and  death), negative JC virus antibody but still at the high risk for the potential PML, most recent MRI of the brain 06/23/17  without change.  JC antibody collected 06/13/17 was negative at 0 .20    PLAN:Continue Tysabri monthly Obtain CBC , JCV labs  MRI of the brain with and without 06/23/17 without change from MRI dated 12/20/2016 Will repeat in 6 months For blurred vision pt has appt with ophthalmology Continue clonazepam as needed for insomnia will refill Follow-up in 6 months I spent 25 min  in total face to face time with the patient more than 50% of which was spent counseling and coordination of care, reviewing test results reviewing medications and discussing and reviewing the diagnosis of MS  and further treatment options. , NRayburn Ma [facility] APRN  [facility] [address] [place] [phone]

## 2018-02-05 ENCOUNTER — Encounter: Payer: Self-pay | Admitting: Nurse Practitioner

## 2018-02-05 ENCOUNTER — Ambulatory Visit: Payer: Medicare Other | Admitting: Nurse Practitioner

## 2018-02-05 VITALS — BP 120/65 | HR 68 | Ht 71.0 in | Wt 212.0 lb

## 2018-02-05 DIAGNOSIS — G35 Multiple sclerosis: Secondary | ICD-10-CM

## 2018-02-05 DIAGNOSIS — Z5181 Encounter for therapeutic drug level monitoring: Secondary | ICD-10-CM | POA: Diagnosis not present

## 2018-02-05 MED ORDER — CLONAZEPAM 0.5 MG PO TABS: 1.0000 mg | ORAL_TABLET | Freq: Every evening | ORAL | 5 refills | Status: DC | PRN

## 2018-02-05 NOTE — Progress Notes (Signed)
Fax confirmation received [facility] [phone]  Clonazepam. sy

## 2018-02-05 NOTE — Patient Instructions (Signed)
Continue Tysabri monthly Obtain CBC , JCV labs  MRI of the brain with and without 06/23/17 without change from MRI dated 12/20/2016 Continue clonazepam as needed for insomnia will refill Follow-up in 6 months

## 2018-02-06 ENCOUNTER — Telehealth: Payer: Self-pay | Admitting: *Deleted

## 2018-02-06 LAB — CBC WITH DIFFERENTIAL/PLATELET
BASOS ABS: 0 10*3/uL (ref 0.0–0.2)
Basos: 1 %
EOS (ABSOLUTE): 0.3 10*3/uL (ref 0.0–0.4)
Eos: 3 %
HEMATOCRIT: 40.1 % (ref 37.5–51.0)
HEMOGLOBIN: 13.7 g/dL (ref 13.0–17.7)
IMMATURE GRANULOCYTES: 1 %
Immature Grans (Abs): 0.1 10*3/uL (ref 0.0–0.1)
LYMPHS ABS: 3.2 10*3/uL — AB (ref 0.7–3.1)
Lymphs: 40 %
MCH: 28.2 pg (ref 26.6–33.0)
MCHC: 34.2 g/dL (ref 31.5–35.7)
MCV: 83 fL (ref 79–97)
Monocytes Absolute: 0.5 10*3/uL (ref 0.1–0.9)
Monocytes: 7 %
NEUTROS PCT: 48 %
Neutrophils Absolute: 4 10*3/uL (ref 1.4–7.0)
Platelets: 152 10*3/uL (ref 150–379)
RBC: 4.86 x10E6/uL (ref 4.14–5.80)
RDW: 13.8 % (ref 12.3–15.4)
WBC: 8.2 10*3/uL (ref 3.4–10.8)

## 2018-02-06 NOTE — Telephone Encounter (Signed)
Spoke to pt and relayed that his lab results were stable.  JCV pending and will call him back with those results when received.  He verbalized understanding.

## 2018-02-06 NOTE — Telephone Encounter (Signed)
-----   Message from Dennie Bible, NP sent at 02/06/2018 12:26 PM EDT ----- Labs stable please call the patient

## 2018-02-07 NOTE — Progress Notes (Signed)
I have reviewed and agreed above plan. 

## 2018-02-12 ENCOUNTER — Telehealth: Payer: Self-pay | Admitting: *Deleted

## 2018-02-12 NOTE — Telephone Encounter (Signed)
noted 

## 2018-02-12 NOTE — Telephone Encounter (Signed)
JCV Negative Index 0.19 resulted from [employer].

## 2018-03-28 ENCOUNTER — Encounter: Payer: Self-pay | Admitting: *Deleted

## 2018-03-28 NOTE — Progress Notes (Signed)
Received MS touch aurhtorization form, filled out and signed by Dr. Krista Blue and fax confirmation received.

## 2018-04-20 ENCOUNTER — Telehealth: Payer: Self-pay | Admitting: Nurse Practitioner

## 2018-04-20 ENCOUNTER — Encounter: Payer: Self-pay | Admitting: *Deleted

## 2018-04-20 NOTE — Telephone Encounter (Signed)
Pt has called stating that when he last saw NP Hoyle Sauer she told him about cooling vest re: his MS.  Pt said he called the telephone number to inquire about getting a cooling vest and he was told that he would need a letter from his provider stating that he does have MS and that it would benefit him.  Please call

## 2018-04-20 NOTE — Telephone Encounter (Addendum)
I spoke to pt and he needs this letter to be able to get a cool vest.  Stating his diagnosis and what medication he is taking.  His daughter plays softball and he wants to be able to go to her games.  I have pended letter onto mychart to send after CM/NP reviews on Monday.  Pt aware.

## 2018-04-23 NOTE — Telephone Encounter (Signed)
Pt called with fax [phone].

## 2018-04-23 NOTE — Telephone Encounter (Signed)
Fax confirmation received at fax left below by pt.

## 2018-04-23 NOTE — Telephone Encounter (Signed)
I spoke to pt and he will call company, to get fax # and then will call back.  He can leave with phone staff.

## 2018-04-30 ENCOUNTER — Encounter: Payer: Self-pay | Admitting: Family Medicine

## 2018-04-30 ENCOUNTER — Other Ambulatory Visit: Payer: Self-pay

## 2018-04-30 ENCOUNTER — Ambulatory Visit (INDEPENDENT_AMBULATORY_CARE_PROVIDER_SITE_OTHER): Payer: Medicare Other | Admitting: Family Medicine

## 2018-04-30 ENCOUNTER — Ambulatory Visit: Payer: Medicare Other

## 2018-04-30 VITALS — BP 122/62 | HR 70 | Temp 98.5°F | Resp 14 | Ht 71.0 in | Wt 212.0 lb

## 2018-04-30 DIAGNOSIS — Z23 Encounter for immunization: Secondary | ICD-10-CM

## 2018-04-30 DIAGNOSIS — L0291 Cutaneous abscess, unspecified: Secondary | ICD-10-CM | POA: Diagnosis not present

## 2018-04-30 DIAGNOSIS — T148XXA Other injury of unspecified body region, initial encounter: Secondary | ICD-10-CM | POA: Diagnosis not present

## 2018-04-30 MED ORDER — SULFAMETHOXAZOLE-TRIMETHOPRIM 800-160 MG PO TABS: 1.0000 | ORAL_TABLET | Freq: Two times a day (BID) | ORAL | 0 refills | Status: DC
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
## 2018-05-01 ENCOUNTER — Ambulatory Visit: Payer: Medicare Other

## 2018-05-03 ENCOUNTER — Ambulatory Visit: Payer: Medicare Other | Admitting: Family Medicine

## 2018-05-03 ENCOUNTER — Encounter: Payer: Self-pay | Admitting: Family Medicine

## 2018-05-03 VITALS — BP 100/68 | HR 72 | Temp 97.8°F | Resp 16 | Ht 71.0 in | Wt 203.0 lb

## 2018-05-03 DIAGNOSIS — L0291 Cutaneous abscess, unspecified: Secondary | ICD-10-CM

## 2018-05-03 LAB — WOUND CULTURE
MICRO NUMBER:: 90805125
SPECIMEN QUALITY: ADEQUATE

## 2018-05-03 NOTE — Progress Notes (Signed)
Subjective:    Patient ID: Jonathon Vazquez, male    DOB: [birth year], 47 y.o.   MRN: [id number]  HPI  04/30/18 Saturday, the patient was struck and sustained a puncture wound in the posterior lateral left thigh with a rusty nail.  He is here today to get a tetanus shot.  However that area has become red hot and painful.  It feels firm and indurated.  The area of erythema is approximately 4 cm in diameter with a firm indurated area approximately 2 cm in diameter.  At that time, my plan was:  Area was anesthetized with 0.1% lidocaine with epinephrine.  A 1 cm vertical incision was made in the center of the firm indurated area.  It was opened with a pair hemostats.  Opaque bloody fluid was expressed from the wound and a wound culture was sent.  The wound cavity was then probed repeatedly and clean with Q-tips soaked in hydrogen peroxide and then packed with 8 inches of quarter inch iodoform gauze.  Patient was started on Bactrim double strength tablets 1 p.o. twice daily for 7 days.  Recheck in 48 hours or sooner if worse.  Tetanus shot was updated. 05/03/18 Wound culture revealed MRSA that is sensitive to Bactrim.  Today the patient is here for wound check.  The area still indurated.  The erythema is fading.  It is still slightly tender to palpation around the wound.  I removed the residual packing which was covered in purulent material and expressed some pus from the wound.  It was a trace amount.  Patient denies any systemic symptoms although I am slightly surprised at how indurated it still feels despite having been on antibiotics for 48 hours that are appropriate Past Medical History:  Diagnosis Date  . Anal fistula 1996  . Anal stenosis   . Atrial fibrillation ([place])    pt and wife deny this  . Colon stricture (HCC)    descending colon  . Crohn's disease ([place])   . Hx of colonic polyp 10/21/2016  . Multiple sclerosis ([place])   . Neuromuscular disorder ([place])   . Patent ductus arteriosus   .  Regional enteritis of large intestine ([place])   . S/P left hemicolectomy 2015   [facility]-done for stricture  . Stricture of descending colon due to Crohn's disease 02/13/2012   Past Surgical History:  Procedure Laterality Date  . COLON SURGERY  2015   descending colon stricture   . COLONOSCOPY     multiple   Current Outpatient Medications on File Prior to Visit  Medication Sig Dispense Refill  . acetaminophen (TYLENOL) 325 MG tablet Take 650 mg by mouth every 6 (six) hours as needed.    . natalizumab (TYSABRI) 300 MG/15ML injection Inject into the vein.    Marland Kitchen sulfamethoxazole-trimethoprim (BACTRIM DS,SEPTRA DS) 800-160 MG tablet Take 1 tablet by mouth 2 (two) times daily. 14 tablet 0   No current facility-administered medications on file prior to visit.    Allergies  Allergen Reactions  . Lialda [Mesalamine] Diarrhea    Severe diarrhea   Social History   Socioeconomic History  . Marital status: Married    Spouse name: [name]  . Number of children: 3  . Years of education: 73  . Highest education level: Not on file  Occupational History  . Occupation: disabled  Social Needs  . Financial resource strain: Not on file  . Food insecurity:    Worry: Not on file    Inability: Not on  file  . Transportation needs:    Medical: Not on file    Non-medical: Not on file  Tobacco Use  . Smoking status: Former Smoker    Types: Cigarettes    Last attempt to quit: 10/24/2004    Years since quitting: 13.5  . Smokeless tobacco: Never Used  Substance and Sexual Activity  . Alcohol use: No  . Drug use: No  . Sexual activity: Not on file  Lifestyle  . Physical activity:    Days per week: Not on file    Minutes per session: Not on file  . Stress: Not on file  Relationships  . Social connections:    Talks on phone: Not on file    Gets together: Not on file    Attends religious service: Not on file    Active member of club or organization: Not on file    Attends meetings of clubs or  organizations: Not on file    Relationship status: Not on file  . Intimate partner violence:    Fear of current or ex partner: Not on file    Emotionally abused: Not on file    Physically abused: Not on file    Forced sexual activity: Not on file  Other Topics Concern  . Not on file  Social History Narrative   Married, 1 son one daughter. 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 Multimedia programmer)   Disabled, previously worked at [employer], disability due to multiple sclerosis   Caffeine one daily   Left handed.   Education- one year of college.      Review of Systems  All other systems reviewed and are negative.      Objective:   Physical Exam  Cardiovascular: Normal rate and regular rhythm.  Pulmonary/Chest: Effort normal and breath sounds normal.  Musculoskeletal:       Left hip: He exhibits tenderness and swelling.       Legs: Vitals reviewed. The area diagram is still indurated.  The erythema is subsided.  The pain has improved        Assessment & Plan:  Abscess Slowly seems to be improving.  Recommended a recheck in 24 hours after removing the gauze and expressing more pus from the wound.  I suspect that this was causing delayed improvement.  If the area is not improving, the patient may need to have the I&D opening extended and the wound probed further.  Reassess in 24 hours or sooner if worse

## 2018-05-04 ENCOUNTER — Telehealth: Payer: Self-pay | Admitting: Family Medicine

## 2018-05-04 NOTE — Telephone Encounter (Signed)
FYI -- pt calling to let us know his back pain is getting better if it gets worse he will let us know

## 2018-05-25 ENCOUNTER — Other Ambulatory Visit: Payer: Medicare Other

## 2018-05-25 DIAGNOSIS — Z0000 Encounter for general adult medical examination without abnormal findings: Secondary | ICD-10-CM

## 2018-05-25 LAB — COMPREHENSIVE METABOLIC PANEL
AG RATIO: 1.6 (calc) (ref 1.0–2.5)
ALBUMIN MSPROF: 4.2 g/dL (ref 3.6–5.1)
ALKALINE PHOSPHATASE (APISO): 72 U/L (ref 40–115)
ALT: 14 U/L (ref 9–46)
AST: 15 U/L (ref 10–40)
BUN: 12 mg/dL (ref 7–25)
CALCIUM: 9.3 mg/dL (ref 8.6–10.3)
CO2: 28 mmol/L (ref 20–32)
Chloride: 104 mmol/L (ref 98–110)
Creat: 1.19 mg/dL (ref 0.60–1.35)
GLOBULIN: 2.6 g/dL (ref 1.9–3.7)
Glucose, Bld: 97 mg/dL (ref 65–99)
Potassium: 4.8 mmol/L (ref 3.5–5.3)
Sodium: 139 mmol/L (ref 135–146)
Total Bilirubin: 1.2 mg/dL (ref 0.2–1.2)
Total Protein: 6.8 g/dL (ref 6.1–8.1)

## 2018-05-25 LAB — CBC WITH DIFFERENTIAL/PLATELET
BASOS ABS: 43 {cells}/uL (ref 0–200)
BASOS PCT: 0.7 %
EOS ABS: 232 {cells}/uL (ref 15–500)
Eosinophils Relative: 3.8 %
HCT: 39.9 % (ref 38.5–50.0)
HEMOGLOBIN: 13.5 g/dL (ref 13.2–17.1)
LYMPHS ABS: 2818 {cells}/uL (ref 850–3900)
MCH: 27.8 pg (ref 27.0–33.0)
MCHC: 33.8 g/dL (ref 32.0–36.0)
MCV: 82.1 fL (ref 80.0–100.0)
MONOS PCT: 8.9 %
MPV: 11.1 fL (ref 7.5–12.5)
NEUTROS ABS: 2464 {cells}/uL (ref 1500–7800)
Neutrophils Relative %: 40.4 %
PLATELETS: 147 10*3/uL (ref 140–400)
RBC: 4.86 10*6/uL (ref 4.20–5.80)
RDW: 13.5 % (ref 11.0–15.0)
Total Lymphocyte: 46.2 %
WBC mixed population: 543 cells/uL (ref 200–950)
WBC: 6.1 10*3/uL (ref 3.8–10.8)

## 2018-05-25 LAB — PSA: PSA: 1.1 ng/mL (ref <=4.0)

## 2018-05-25 LAB — LIPID PANEL
Cholesterol: 202 mg/dL — ABNORMAL HIGH (ref <200)
HDL: 56 mg/dL (ref >40)
LDL Cholesterol (Calc): 130 mg/dL (calc) — ABNORMAL HIGH
Non-HDL Cholesterol (Calc): 146 mg/dL (calc) — ABNORMAL HIGH (ref <130)
TRIGLYCERIDES: 70 mg/dL (ref <150)
Total CHOL/HDL Ratio: 3.6 (calc) (ref <5.0)

## 2018-05-28 ENCOUNTER — Ambulatory Visit (INDEPENDENT_AMBULATORY_CARE_PROVIDER_SITE_OTHER): Payer: Medicare Other | Admitting: Family Medicine

## 2018-05-28 ENCOUNTER — Encounter: Payer: Self-pay | Admitting: Family Medicine

## 2018-05-28 VITALS — BP 120/70 | HR 66 | Temp 97.8°F | Resp 14 | Ht 71.0 in | Wt 204.0 lb

## 2018-05-28 DIAGNOSIS — K5000 Crohn's disease of small intestine without complications: Secondary | ICD-10-CM

## 2018-05-28 DIAGNOSIS — Z0000 Encounter for general adult medical examination without abnormal findings: Secondary | ICD-10-CM

## 2018-05-28 DIAGNOSIS — Z13220 Encounter for screening for lipoid disorders: Secondary | ICD-10-CM

## 2018-05-28 DIAGNOSIS — Z125 Encounter for screening for malignant neoplasm of prostate: Secondary | ICD-10-CM

## 2018-05-28 DIAGNOSIS — G35 Multiple sclerosis: Secondary | ICD-10-CM

## 2018-05-28 NOTE — Progress Notes (Signed)
Subjective:    Patient ID: Jonathon Vazquez, male    DOB: [birth year], 47 y.o.   MRN: [id number]  HPI Patient is a 47 year old African-American male who is here today for complete physical exam. Past medical history  Is significant for multiple sclerosis as well as Crohn's colitis on chronic immunosuppressive therapy. In 2015 he underwent partial resection of the colon. Most recent colonoscopy was in 2016 which showed some mild ulcerations around the anastomotic site and some friable lesions. Family history is significant for a grandfather with prostate cancer.    I recently have been treating the patient for an abscess on his left lateral thigh.  This is healed nicely.  There is no residual opening.  There is no erythema warmth or fluctuance.  There is no residual pain.  Lesion has completely and totally healed.  In the area behind his right knee is a 5 mm nodular lesion where the patient states that there was previously a tick.  It is a hyperpigmented papule.  It appears to be a keloid versus a dermatofibroma.  I suspect that there may be retained foreign body within the lesion however it is not bothering the patient and he elects not to have the lesion opened in an effort to remove any foreign material as it is asymptomatic.  Initially it itched but this is even subsided.  He is willing to live with a small lesion behind his knee.  He is due for a flu shot.  He politely refuses an HIV test. Immunization History  Administered Date(s) Administered  . H1N1 10/30/2008  . Hepatitis A 08/30/2007, 05/05/2015  . IPV 08/30/2007  . Influenza,inj,Quad PF,6+ Mos 09/28/2015, 08/15/2016, 09/22/2017  . Pneumococcal Conjugate-13 10/03/2016  . Pneumococcal Polysaccharide-23 09/28/2015  . Td 08/30/2007  . Tdap 08/30/2007, 04/30/2018  . Typhoid Parenteral, AKD ([place] [employer]) 08/30/2007, 05/05/2015  . Yellow Fever 08/30/2007   Most recent labs are listed below: Lab on 05/25/2018  Component Date Value Ref Range  Status  . WBC 05/25/2018 6.1  3.8 - 10.8 Thousand/uL Final  . RBC 05/25/2018 4.86  4.20 - 5.80 Million/uL Final  . Hemoglobin 05/25/2018 13.5  13.2 - 17.1 g/dL Final  . HCT 05/25/2018 39.9  38.5 - 50.0 % Final  . MCV 05/25/2018 82.1  80.0 - 100.0 fL Final  . MCH 05/25/2018 27.8  27.0 - 33.0 pg Final  . MCHC 05/25/2018 33.8  32.0 - 36.0 g/dL Final  . RDW 05/25/2018 13.5  11.0 - 15.0 % Final  . Platelets 05/25/2018 147  140 - 400 Thousand/uL Final  . MPV 05/25/2018 11.1  7.5 - 12.5 fL Final  . Neutro Abs 05/25/2018 2,464  1,500 - 7,800 cells/uL Final  . Lymphs Abs 05/25/2018 2,818  850 - 3,900 cells/uL Final  . WBC mixed population 05/25/2018 543  200 - 950 cells/uL Final  . Eosinophils Absolute 05/25/2018 232  15 - 500 cells/uL Final  . Basophils Absolute 05/25/2018 43  0 - 200 cells/uL Final  . Neutrophils Relative % 05/25/2018 40.4  % Final  . Total Lymphocyte 05/25/2018 46.2  % Final  . Monocytes Relative 05/25/2018 8.9  % Final  . Eosinophils Relative 05/25/2018 3.8  % Final  . Basophils Relative 05/25/2018 0.7  % Final  . Smear Review 05/25/2018    Final   Comment: Review of peripheral smear confirms automated results.   . Glucose, Bld 05/25/2018 97  65 - 99 mg/dL Final   Comment: .  Fasting reference interval .   . BUN 05/25/2018 12  7 - 25 mg/dL Final  . Creat 05/25/2018 1.19  0.60 - 1.35 mg/dL Final  . BUN/Creatinine Ratio 23/53/6144 NOT APPLICABLE  6 - 22 (calc) Final  . Sodium 05/25/2018 139  135 - 146 mmol/L Final  . Potassium 05/25/2018 4.8  3.5 - 5.3 mmol/L Final  . Chloride 05/25/2018 104  98 - 110 mmol/L Final  . CO2 05/25/2018 28  20 - 32 mmol/L Final  . Calcium 05/25/2018 9.3  8.6 - 10.3 mg/dL Final  . Total Protein 05/25/2018 6.8  6.1 - 8.1 g/dL Final  . Albumin 05/25/2018 4.2  3.6 - 5.1 g/dL Final  . Globulin 05/25/2018 2.6  1.9 - 3.7 g/dL (calc) Final  . AG Ratio 05/25/2018 1.6  1.0 - 2.5 (calc) Final  . Total Bilirubin 05/25/2018 1.2  0.2 - 1.2  mg/dL Final  . Alkaline phosphatase (APISO) 05/25/2018 72  40 - 115 U/L Final  . AST 05/25/2018 15  10 - 40 U/L Final  . ALT 05/25/2018 14  9 - 46 U/L Final  . Cholesterol 05/25/2018 202* <200 mg/dL Final  . HDL 05/25/2018 56  >40 mg/dL Final  . Triglycerides 05/25/2018 70  <150 mg/dL Final  . LDL Cholesterol (Calc) 05/25/2018 130* mg/dL (calc) Final   Comment: Reference range: <100 . Desirable range <100 mg/dL for primary prevention;   <70 mg/dL for patients with CHD or diabetic patients  with > or = 2 CHD risk factors. Marland Kitchen LDL-C is now calculated using the Martin-Hopkins  calculation, which is a validated novel method providing  better accuracy than the Friedewald equation in the  estimation of LDL-C.  Cresenciano Genre et al. Annamaria Helling. 3154;008(67): 2061-2068  (http://education.[facility].com/faq/FAQ164)   . Total CHOL/HDL Ratio 05/25/2018 3.6  <5.0 (calc) Final  . Non-HDL Cholesterol (Calc) 05/25/2018 146* <130 mg/dL (calc) Final   Comment: For patients with diabetes plus 1 major ASCVD risk  factor, treating to a non-HDL-C goal of <100 mg/dL  (LDL-C of <70 mg/dL) is considered a therapeutic  option.   Marland Kitchen PSA 05/25/2018 1.1  < OR = 4.0 ng/mL Final   Comment: The total PSA value from this assay system is  standardized against the WHO standard. The test  result will be approximately 20% lower when compared  to the equimolar-standardized total PSA (Beckman  Coulter). Comparison of serial PSA results should be  interpreted with this fact in mind. . This test was performed using the Siemens  chemiluminescent method. Values obtained from  different assay methods cannot be used interchangeably. PSA levels, regardless of value, should not be interpreted as absolute evidence of the presence or absence of disease.   Office Visit on 04/30/2018  Component Date Value Ref Range Status  . MICRO NUMBER: 04/30/2018 [id number]   Final  . SPECIMEN QUALITY: 04/30/2018 ADEQUATE   Final  . SOURCE:  04/30/2018 L POSTERIOR THIGH   Final  . STATUS: 04/30/2018 FINAL   Final  . GRAM STAIN: 04/30/2018 Gram positive cocci in chains   Final   No white blood cells seen No epithelial cells seen Few Gram positive cocci in clusters Few Gram positive cocci in chains  . ISOLATE 1: 04/30/2018 methicillin resistant Staphylococcus aureus*  Final   Heavy growth of Methicillin resistant Staphylococcus aureus (MRSA) Negative for inducible clindamycin resistance.    Past Medical History:  Diagnosis Date  . Anal fistula 1996  . Anal stenosis   . Atrial fibrillation ([place])  pt and wife deny this  . Colon stricture (HCC)    descending colon  . Crohn's disease ([place])   . Hx of colonic polyp 10/21/2016  . Multiple sclerosis ([place])   . Neuromuscular disorder ([place])   . Patent ductus arteriosus   . Regional enteritis of large intestine ([place])   . S/P left hemicolectomy 2015   [facility]-done for stricture  . Stricture of descending colon due to Crohn's disease 02/13/2012   Past Surgical History:  Procedure Laterality Date  . COLON SURGERY  2015   descending colon stricture   . COLONOSCOPY     multiple   Current Outpatient Medications on File Prior to Visit  Medication Sig Dispense Refill  . acetaminophen (TYLENOL) 325 MG tablet Take 650 mg by mouth every 6 (six) hours as needed.    . natalizumab (TYSABRI) 300 MG/15ML injection Inject into the vein.    Marland Kitchen sulfamethoxazole-trimethoprim (BACTRIM DS,SEPTRA DS) 800-160 MG tablet Take 1 tablet by mouth 2 (two) times daily. 14 tablet 0   No current facility-administered medications on file prior to visit.    Allergies  Allergen Reactions  . Lialda [Mesalamine] Diarrhea    Severe diarrhea   Social History   Socioeconomic History  . Marital status: Married    Spouse name: [name]  . Number of children: 3  . Years of education: 37  . Highest education level: Not on file  Occupational History  . Occupation: disabled  Social Needs  . Financial resource  strain: Not on file  . Food insecurity:    Worry: Not on file    Inability: Not on file  . Transportation needs:    Medical: Not on file    Non-medical: Not on file  Tobacco Use  . Smoking status: Former Smoker    Types: Cigarettes    Last attempt to quit: 10/24/2004    Years since quitting: 13.6  . Smokeless tobacco: Never Used  Substance and Sexual Activity  . Alcohol use: No  . Drug use: No  . Sexual activity: Not on file  Lifestyle  . Physical activity:    Days per week: Not on file    Minutes per session: Not on file  . Stress: Not on file  Relationships  . Social connections:    Talks on phone: Not on file    Gets together: Not on file    Attends religious service: Not on file    Active member of club or organization: Not on file    Attends meetings of clubs or organizations: Not on file    Relationship status: Not on file  . Intimate partner violence:    Fear of current or ex partner: Not on file    Emotionally abused: Not on file    Physically abused: Not on file    Forced sexual activity: Not on file  Other Topics Concern  . Not on file  Social History Narrative   Married, 1 son one daughter. 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 Multimedia programmer)   Disabled, previously worked at [employer], disability due to multiple sclerosis   Caffeine one daily   Left handed.   Education- one year of college.   Family History  Problem Relation Age of Onset  . Ulcerative colitis Father   . Heart disease Mother   . Prostate cancer Paternal Grandfather   . Diabetes Paternal Grandmother   . Colon cancer Neg Hx   . Rectal cancer Neg Hx   . Stomach cancer Neg Hx  Review of Systems  All other systems reviewed and are negative.      Objective:   Physical Exam  Constitutional: He is oriented to person, place, and time. He appears well-developed and well-nourished. No distress.  HENT:  Head: Normocephalic and atraumatic.  Right Ear: External ear normal.  Left Ear: External ear normal.  Nose:  Nose normal.  Mouth/Throat: Oropharynx is clear and moist. No oropharyngeal exudate.  Eyes: Pupils are equal, round, and reactive to light. Conjunctivae and EOM are normal. Right eye exhibits no discharge. Left eye exhibits no discharge. No scleral icterus.  Neck: Normal range of motion. Neck supple. No JVD present. No tracheal deviation present. No thyromegaly present.  Cardiovascular: Normal rate, regular rhythm, normal heart sounds and intact distal pulses. Exam reveals no gallop and no friction rub.  No murmur heard. Pulmonary/Chest: Effort normal and breath sounds normal. No stridor. No respiratory distress. He has no wheezes. He has no rales. He exhibits no tenderness.  Abdominal: Soft. Bowel sounds are normal. He exhibits no distension and no mass. There is no tenderness. There is no rebound and no guarding.  Genitourinary: Testes normal. Cremasteric reflex is present.  Musculoskeletal: Normal range of motion. He exhibits no edema or tenderness.  Lymphadenopathy:    He has no cervical adenopathy.  Neurological: He is alert and oriented to person, place, and time. He has normal reflexes. No cranial nerve deficit. He exhibits normal muscle tone. Coordination normal.  Skin: Skin is warm. No rash noted. He is not diaphoretic. No erythema. No pallor.  Psychiatric: He has a normal mood and affect. His behavior is normal. Judgment and thought content normal.  Vitals reviewed.  There are numerous keloids on his shoulders and upper back.  He declines dermatology referral for intralesional corticosteroid injections       Assessment & Plan:  General medical exam  Using his cholesterol, I calculated his 10-year risk of cardiovascular disease to be 4.2%.  Therefore he does not need to be on a statin according to the [employer] of cardiology.  I did recommend therapeutic lifestyle changes to address his mildly elevated LDL cholesterol including less red meat, less pork, less fried foods, and  more fruits and vegetables.  I recommended a flu shot this fall.  He is already had Pneumovax 23 and Prevnar 13.  His colonoscopy is due next year because of his inflammatory bowel disease.  PSA shows no evidence of prostate cancer.  The remainder of his lab work is normal.  His blood pressure is excellent.  Regular anticipatory guidance is provided.

## 2018-06-12 ENCOUNTER — Other Ambulatory Visit: Payer: Self-pay | Admitting: *Deleted

## 2018-06-12 ENCOUNTER — Telehealth: Payer: Self-pay | Admitting: Neurology

## 2018-06-12 DIAGNOSIS — G35 Multiple sclerosis: Secondary | ICD-10-CM

## 2018-06-12 NOTE — Telephone Encounter (Signed)
[employer] Medicare auth: [place] Ref # [id number] order sent to [facility]. They will reach out to the pt to schedule.

## 2018-06-20 ENCOUNTER — Ambulatory Visit (INDEPENDENT_AMBULATORY_CARE_PROVIDER_SITE_OTHER): Payer: Medicare Other | Admitting: Physician Assistant

## 2018-06-20 ENCOUNTER — Encounter: Payer: Self-pay | Admitting: Physician Assistant

## 2018-06-20 VITALS — BP 120/70 | HR 82 | Temp 98.2°F | Resp 14 | Ht 71.0 in | Wt 207.0 lb

## 2018-06-20 DIAGNOSIS — M71122 Other infective bursitis, left elbow: Secondary | ICD-10-CM | POA: Diagnosis not present

## 2018-06-20 MED ORDER — CEPHALEXIN 500 MG PO CAPS: 500.0000 mg | ORAL_CAPSULE | Freq: Four times a day (QID) | ORAL | 0 refills | Status: DC

## 2018-06-20 NOTE — Progress Notes (Signed)
    Patient ID: EDU ON MRN: [id number], DOB: November 10, 1970, 47 y.o. Date of Encounter: 06/20/2018, 9:08 AM    Chief Complaint:  Chief Complaint  Patient presents with  . Left elbow swelling, red, hot to touch - low grade fever     HPI: 47 y.o. year old male presents with above.   Reports on Monday, 06/18/2018 he was pressure washing his house. Reports that at that time his left elbow seemed perfectly fine.  He was having no pain there.  No redness or swelling. Reports that he is not aware of any type of trauma or injury to the elbow.  Not aware of having any bite or sting to the elbow. States that the following morning which was Tuesday, 06/19/2018 when he woke up he noticed a little spot of tenderness and redness and firmness on his left elbow. His wife is a nurse so he showed elbow to her and she noted that it was warm. He reports that now the area affected on the left elbow is getting bigger so came in for evaluation. Reports that the area is not itchy.     Home Meds:   Outpatient Medications Prior to Visit  Medication Sig Dispense Refill  . acetaminophen (TYLENOL) 325 MG tablet Take 650 mg by mouth every 6 (six) hours as needed.    . natalizumab (TYSABRI) 300 MG/15ML injection Inject into the vein.    Marland Kitchen sulfamethoxazole-trimethoprim (BACTRIM DS,SEPTRA DS) 800-160 MG tablet Take 1 tablet by mouth 2 (two) times daily. 14 tablet 0   No facility-administered medications prior to visit.     Allergies:  Allergies  Allergen Reactions  . Lialda [Mesalamine] Diarrhea    Severe diarrhea      Review of Systems: See HPI for pertinent ROS. All other ROS negative.    Physical Exam: Blood pressure 120/70, pulse 82, temperature 98.2 F (36.8 C), temperature source Oral, resp. rate 14, height 5' 11"  (1.803 m), weight 93.9 kg, SpO2 98 %., Body mass index is 28.87 kg/m. General:  WNWD AAM. Appears in no acute distress. Neck: Supple. No thyromegaly. No lymphadenopathy. Lungs:  Clear bilaterally to auscultation without wheezes, rales, or rhonchi. Breathing is unlabored. Heart: Regular rhythm. No murmurs, rubs, or gallops. Msk:  Extremities/Skin: Left Elbow: From the olecranon process up/superior---- there is an area of mild firmness, erythema, warmth.  This area measures 5 cm diameter.  It is nonfluctuant.  There is no sign of abscess.  No sign of fluid.  Neuro: Alert and oriented X 3. Moves all extremities spontaneously. Gait is normal. CNII-XII grossly in tact. Psych:  Responds to questions appropriately with a normal affect.     ASSESSMENT AND PLAN:  47 y.o. year old male with   1. Infection of left olecranon bursa Findings are not consistent with an abscess. Findings are not consistent with effusion / fluid that needs to be drawn off.  Ace bandage applied for mild compression to the site. He is to start the Keflex immediately and take as directed. He is to follow-up if site worsens or is not improved in 2 to 3 days or if site does not return to normal baseline at completion of antibiotic.  - cephALEXin (KEFLEX) 500 MG capsule; Take 1 capsule (500 mg total) by mouth 4 (four) times daily for 10 days.  Dispense: 40 capsule; Refill: 0   Signed, [address] [place], [place], [facility] 06/20/2018 9:08 AM

## 2018-06-22 ENCOUNTER — Ambulatory Visit (INDEPENDENT_AMBULATORY_CARE_PROVIDER_SITE_OTHER): Payer: Medicare Other | Admitting: Family Medicine

## 2018-06-22 ENCOUNTER — Encounter: Payer: Self-pay | Admitting: Family Medicine

## 2018-06-22 VITALS — BP 110/60 | HR 78 | Temp 97.8°F | Resp 14 | Ht 71.0 in | Wt 205.0 lb

## 2018-06-22 DIAGNOSIS — L03114 Cellulitis of left upper limb: Secondary | ICD-10-CM | POA: Diagnosis not present

## 2018-06-22 DIAGNOSIS — L02414 Cutaneous abscess of left upper limb: Secondary | ICD-10-CM

## 2018-06-22 DIAGNOSIS — L0291 Cutaneous abscess, unspecified: Secondary | ICD-10-CM

## 2018-06-22 DIAGNOSIS — M71022 Abscess of bursa, left elbow: Secondary | ICD-10-CM | POA: Diagnosis not present

## 2018-06-22 MED ORDER — SULFAMETHOXAZOLE-TRIMETHOPRIM 800-160 MG PO TABS: 1.0000 | ORAL_TABLET | Freq: Two times a day (BID) | ORAL | 0 refills | Status: DC

## 2018-06-22 MED ORDER — HYDROCODONE-ACETAMINOPHEN 5-325 MG PO TABS: 1.0000 | ORAL_TABLET | Freq: Four times a day (QID) | ORAL | 0 refills | Status: DC | PRN

## 2018-06-22 NOTE — Progress Notes (Signed)
Subjective:    Patient ID: Jonathon Vazquez, male    DOB: 1971/01/18, 47 y.o.   MRN: [id number]  HPI Patient was seen earlier in the week and was started on Keflex for presumed olecranon bursitis.  He presents today stating that his elbow is much worse.  There is erythema tracking up his tricep, around his left elbow, and down his left forearm as diagram below there is significant swelling around the olecranon bursa and also down into the left forearm.  There is a visible 1 cm pustule on the surface of the skin directly over top of the elbow that is tense and painful to the touch.  He is also having fever to 101 last night.  Surprisingly, the patient has no pain with flexion and extension of his elbow.  He has no pain with supination or pronation.  Range of motion of the elbow elicits no pain.  The infection appears to be superficial to the elbow joint and spreading in the soft tissues up and down the posterior aspect of his left arm as diagrammed.  Patient has a past medical history of MRSA recently treated. Past Medical History:  Diagnosis Date  . Anal fistula 1996  . Anal stenosis   . Atrial fibrillation ([place])    pt and wife deny this  . Colon stricture (HCC)    descending colon  . Crohn's disease ([place])   . Hx of colonic polyp 10/21/2016  . Multiple sclerosis ([place])   . Neuromuscular disorder ([place])   . Patent ductus arteriosus   . Regional enteritis of large intestine ([place])   . S/P left hemicolectomy 2015   [facility] for stricture  . Stricture of descending colon due to Crohn's disease 02/13/2012   Past Surgical History:  Procedure Laterality Date  . COLON SURGERY  2015   descending colon stricture   . COLONOSCOPY     multiple   Current Outpatient Medications on File Prior to Visit  Medication Sig Dispense Refill  . acetaminophen (TYLENOL) 325 MG tablet Take 650 mg by mouth every 6 (six) hours as needed.    . cephALEXin (KEFLEX) 500 MG capsule Take 1 capsule (500 mg total) by mouth  4 (four) times daily for 10 days. 40 capsule 0  . natalizumab (TYSABRI) 300 MG/15ML injection Inject into the vein.     No current facility-administered medications on file prior to visit.    Allergies  Allergen Reactions  . Lialda [Mesalamine] Diarrhea    Severe diarrhea   Social History   Socioeconomic History  . Marital status: Married    Spouse name: [name]  . Number of children: 3  . Years of education: 38  . Highest education level: Not on file  Occupational History  . Occupation: disabled  Social Needs  . Financial resource strain: Not on file  . Food insecurity:    Worry: Not on file    Inability: Not on file  . Transportation needs:    Medical: Not on file    Non-medical: Not on file  Tobacco Use  . Smoking status: Former Smoker    Types: Cigarettes    Last attempt to quit: 10/24/2004    Years since quitting: 13.6  . Smokeless tobacco: Never Used  Substance and Sexual Activity  . Alcohol use: No  . Drug use: No  . Sexual activity: Not on file  Lifestyle  . Physical activity:    Days per week: Not on file    Minutes per session:  Not on file  . Stress: Not on file  Relationships  . Social connections:    Talks on phone: Not on file    Gets together: Not on file    Attends religious service: Not on file    Active member of club or organization: Not on file    Attends meetings of clubs or organizations: Not on file    Relationship status: Not on file  . Intimate partner violence:    Fear of current or ex partner: Not on file    Emotionally abused: Not on file    Physically abused: Not on file    Forced sexual activity: Not on file  Other Topics Concern  . Not on file  Social History Narrative   Married, 1 son one daughter. 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 Multimedia programmer)   Disabled, previously worked at [employer], disability due to multiple sclerosis   Caffeine one daily   Left handed.   Education- one year of college.      Review of Systems  All other systems  reviewed and are negative.      Objective:   Physical Exam  Constitutional: He appears well-developed and well-nourished. No distress.  Cardiovascular: Normal rate, regular rhythm and normal heart sounds.  Pulmonary/Chest: Effort normal and breath sounds normal.  Musculoskeletal:       Left elbow: He exhibits swelling and deformity. He exhibits normal range of motion, no effusion and no laceration. Tenderness found. Olecranon process tenderness noted. No radial head, no medial epicondyle and no lateral epicondyle tenderness noted.       Arms: Skin: He is not diaphoretic.  Vitals reviewed.         Assessment & Plan:  Abscess - Plan: sulfamethoxazole-trimethoprim (BACTRIM DS,SEPTRA DS) 800-160 MG tablet, HYDROcodone-acetaminophen (NORCO/VICODIN) 5-325 MG tablet  It is unclear whether this is septic bursitis with extension into the soft tissues tracking up the posterior left arm into the triceps region and down into the left forearm or whether this is a superficial soft tissue infection spreading in the skin over the elbow but not involving the olecranon bursa.  First I anesthetized the skin distal to the pustule where there is no erythema.  The area was prepped and draped in sterile fashion.  Using an 18-gauge needle, I then aspirated the olecranon bursa and recovered approximately 5 cc of yellow clear serous fluid which I will send for cell count and differential along with culture.  Next I anesthetized the skin over the pustule.  I made a cruciate incision 1 cm x 1 cm directly into the surface of the pustule.  Copious pus poured from the wound.  I estimate 20 cc of opaque yellow pus was retrieved.  I then introduced a hemostat to open the incision releasing even more pus that by palpation appeared to be coming from the soft tissue over the distal tricep.  I estimated I retrieved another 10 cc of pus from this.  The entire cavity was approximately 3 cm x 2 cm.  I then used Q-tips with hydrogen  peroxide probed the wound cavity on 5 separate occasions to clean the cavity.  9 inches of quarter inch iodoform gauze was then packed into the wound with strict wound care instructions given to the patient.  He will begin Bactrim double strength tablets 1 p.o. twice daily immediately to cover MRSA.  I want him to take 2 doses tonight 1 as soon as he gets the tablet and 1 before bed.  I want to  see him back Tuesday.  If over the weekend, the swelling pain and redness worsens, he is to go directly to the hospital.  If he has pain with range of motion in his elbow he is to go directly to the hospital.  Based on the olecranon bursa aspiration, I believe that there is not septic bursitis.  I believe that this is instead a soft tissue abscess with cellulitis tracking up and down the arm as diagrammed.  Patient is comfortable with the plan and will go immediately to the hospital if worsening

## 2018-06-23 ENCOUNTER — Encounter (HOSPITAL_COMMUNITY): Payer: Self-pay | Admitting: Emergency Medicine

## 2018-06-23 ENCOUNTER — Inpatient Hospital Stay (HOSPITAL_COMMUNITY)
Admission: EM | Admit: 2018-06-23 | Discharge: 2018-06-26 | DRG: 603 | Disposition: A | Discharge status: Home / Self Care | Payer: Medicare Other | Attending: Internal Medicine | Admitting: Internal Medicine

## 2018-06-23 DIAGNOSIS — K5010 Crohn's disease of large intestine without complications: Secondary | ICD-10-CM | POA: Diagnosis present

## 2018-06-23 DIAGNOSIS — L0390 Cellulitis, unspecified: Secondary | ICD-10-CM | POA: Diagnosis present

## 2018-06-23 DIAGNOSIS — A4902 Methicillin resistant Staphylococcus aureus infection, unspecified site: Secondary | ICD-10-CM | POA: Diagnosis present

## 2018-06-23 DIAGNOSIS — I4891 Unspecified atrial fibrillation: Secondary | ICD-10-CM | POA: Diagnosis present

## 2018-06-23 DIAGNOSIS — M79602 Pain in left arm: Secondary | ICD-10-CM | POA: Diagnosis not present

## 2018-06-23 DIAGNOSIS — Z79899 Other long term (current) drug therapy: Secondary | ICD-10-CM

## 2018-06-23 DIAGNOSIS — M25522 Pain in left elbow: Secondary | ICD-10-CM

## 2018-06-23 DIAGNOSIS — L03119 Cellulitis of unspecified part of limb: Secondary | ICD-10-CM

## 2018-06-23 DIAGNOSIS — L03114 Cellulitis of left upper limb: Principal | ICD-10-CM | POA: Diagnosis present

## 2018-06-23 DIAGNOSIS — Z87891 Personal history of nicotine dependence: Secondary | ICD-10-CM

## 2018-06-23 DIAGNOSIS — L02414 Cutaneous abscess of left upper limb: Secondary | ICD-10-CM | POA: Diagnosis present

## 2018-06-23 DIAGNOSIS — Z888 Allergy status to other drugs, medicaments and biological substances status: Secondary | ICD-10-CM

## 2018-06-23 DIAGNOSIS — M009 Pyogenic arthritis, unspecified: Secondary | ICD-10-CM

## 2018-06-23 DIAGNOSIS — Z86010 Personal history of colonic polyps: Secondary | ICD-10-CM

## 2018-06-23 DIAGNOSIS — Z8614 Personal history of Methicillin resistant Staphylococcus aureus infection: Secondary | ICD-10-CM

## 2018-06-23 DIAGNOSIS — M7022 Olecranon bursitis, left elbow: Secondary | ICD-10-CM | POA: Diagnosis present

## 2018-06-23 DIAGNOSIS — L0291 Cutaneous abscess, unspecified: Secondary | ICD-10-CM

## 2018-06-23 DIAGNOSIS — Z9049 Acquired absence of other specified parts of digestive tract: Secondary | ICD-10-CM

## 2018-06-23 DIAGNOSIS — G35 Multiple sclerosis: Secondary | ICD-10-CM | POA: Diagnosis present

## 2018-06-23 DIAGNOSIS — Q250 Patent ductus arteriosus: Secondary | ICD-10-CM

## 2018-06-23 HISTORY — DX: Methicillin resistant Staphylococcus aureus infection, unspecified site: A49.02

## 2018-06-23 LAB — COMPREHENSIVE METABOLIC PANEL
ALT: 33 U/L (ref 0–44)
ANION GAP: 10 (ref 5–15)
AST: 23 U/L (ref 15–41)
Albumin: 3.7 g/dL (ref 3.5–5.0)
Alkaline Phosphatase: 88 U/L (ref 38–126)
BUN: 12 mg/dL (ref 6–20)
CHLORIDE: 101 mmol/L (ref 98–111)
CO2: 25 mmol/L (ref 22–32)
Calcium: 9 mg/dL (ref 8.9–10.3)
Creatinine, Ser: 1.25 mg/dL — ABNORMAL HIGH (ref 0.61–1.24)
GFR calc non Af Amer: >60 mL/min (ref >60)
GLUCOSE: 138 mg/dL — AB (ref 70–99)
Potassium: 3.7 mmol/L (ref 3.5–5.1)
SODIUM: 136 mmol/L (ref 135–145)
Total Bilirubin: 1.1 mg/dL (ref 0.3–1.2)
Total Protein: 7.1 g/dL (ref 6.5–8.1)

## 2018-06-23 LAB — CBC WITH DIFFERENTIAL/PLATELET
ABS IMMATURE GRANULOCYTES: 0.1 10*3/uL (ref 0.0–0.1)
Basophils Absolute: 0 10*3/uL (ref 0.0–0.1)
Basophils Relative: 0 %
Eosinophils Absolute: 0.2 10*3/uL (ref 0.0–0.7)
Eosinophils Relative: 2 %
HEMATOCRIT: 40 % (ref 39.0–52.0)
HEMOGLOBIN: 12.8 g/dL — AB (ref 13.0–17.0)
IMMATURE GRANULOCYTES: 1 %
LYMPHS ABS: 2.8 10*3/uL (ref 0.7–4.0)
Lymphocytes Relative: 27 %
MCH: 27.6 pg (ref 26.0–34.0)
MCHC: 32 g/dL (ref 30.0–36.0)
MCV: 86.2 fL (ref 78.0–100.0)
MONO ABS: 0.7 10*3/uL (ref 0.1–1.0)
MONOS PCT: 7 %
NEUTROS PCT: 63 %
Neutro Abs: 6.3 10*3/uL (ref 1.7–7.7)
Platelets: 170 10*3/uL (ref 150–400)
RBC: 4.64 MIL/uL (ref 4.22–5.81)
RDW: 13.6 % (ref 11.5–15.5)
WBC: 10 10*3/uL (ref 4.0–10.5)

## 2018-06-23 LAB — SYNOVIAL CELL COUNT + DIFF, W/ CRYSTALS

## 2018-06-23 LAB — I-STAT CG4 LACTIC ACID, ED: LACTIC ACID, VENOUS: 1.05 mmol/L (ref 0.5–1.9)

## 2018-06-23 MED ORDER — SODIUM CHLORIDE 0.9 % IV BOLUS
1000.0000 mL | Freq: Once | INTRAVENOUS | Status: AC
  Administered 2018-06-24: 1000 mL via INTRAVENOUS

## 2018-06-23 MED ORDER — VANCOMYCIN HCL IN DEXTROSE 1-5 GM/200ML-% IV SOLN
1000.0000 mg | Freq: Once | INTRAVENOUS | Status: AC
  Administered 2018-06-24: 1000 mg via INTRAVENOUS
  Filled 2018-06-23: qty 200

## 2018-06-23 NOTE — ED Triage Notes (Signed)
Pt has been treated for an elbow/arm infection for over a week, left arm is now swollen, hot to touch, red.  His PCP informed him he needs to go to the hospital to get "liquid IV antibiotics."  Reports fevers at night

## 2018-06-23 NOTE — ED Provider Notes (Signed)
[facility] EMERGENCY DEPARTMENT Provider Note   CSN: [id number] Arrival date & time: 06/23/18  2047     History   Chief Complaint Chief Complaint  Patient presents with  . Elbow Pain    HPI Jonathon Vazquez is a 47 y.o. male with a hx of MS, Crohn's, left hemicolectomy presents to the Emergency Department complaining of gradual, persistent, progressively worsening left arm cellullitis onset 5 days ago.  Pt reports he thinks it initially started from a bug bite.  He reports he saw his primary care physician on Monday and was given Keflex.  He returned to his primary care physician on Thursday.  At that visit, per the record primary care physician performed I&D of the cellulitis and a large abscess.  Additionally, patient's bursa was aspirated with clear fluid without evidence of septic bursitis.  Patient's antibiotic was switched to Bactrim.  Patient reports compliance with all of his medications.  He states that over the last 24-48 hours of the redness and swelling of his left arm have worsened now traveling all the way down to his left wrist.  He reports fevers at home to 102 along with chills and sweats.  He denies chest pain, shortness of breath, abdominal pain, nausea, vomiting, diarrhea, weakness, dizziness, syncope.  Patient does report a history of MRSA infection.  He reports his primary care did send a wound culture.  Patient reports he has pain with range of motion of his elbow but is able to perform that.  Nothing seems to make his symptoms better.  The history is provided by the patient and medical records. No language interpreter was used.    Past Medical History:  Diagnosis Date  . Anal fistula 1996  . Anal stenosis   . Atrial fibrillation ([place])    pt and wife deny this  . Colon stricture (HCC)    descending colon  . Crohn's disease ([place])   . Hx of colonic polyp 10/21/2016  . Multiple sclerosis ([place])   . Neuromuscular disorder ([place])   . Patent ductus  arteriosus   . Regional enteritis of large intestine ([place])   . S/P left hemicolectomy 2015   [facility]-done for stricture  . Stricture of descending colon due to Crohn's disease 02/13/2012    Patient Active Problem List   Diagnosis Date Noted  . Therapeutic drug monitoring 06/13/2017  . Encounter for therapeutic drug monitoring 02/09/2017  . Visual changes 12/07/2016  . Insomnia 12/07/2016  . Hx of colonic polyp 10/21/2016  . PDA (patent ductus arteriosus) 03/01/2012  . MULTIPLE SCLEROSIS 12/11/2008  . Crohn's colitis- hx strictures and resection 12/11/2008    Past Surgical History:  Procedure Laterality Date  . COLON SURGERY  2015   descending colon stricture   . COLONOSCOPY     multiple        Home Medications    Prior to Admission medications   Medication Sig Start Date End Date Taking? Authorizing Provider  acetaminophen (TYLENOL) 325 MG tablet Take 650 mg by mouth every 6 (six) hours as needed.    [provider]  cephALEXin (KEFLEX) 500 MG capsule Take 1 capsule (500 mg total) by mouth 4 (four) times daily for 10 days. 06/20/18 06/30/18  Orlena Sheldon, PA-C  HYDROcodone-acetaminophen (NORCO/VICODIN) 5-325 MG tablet Take 1 tablet by mouth every 6 (six) hours as needed for moderate pain. 06/22/18   Susy Frizzle, MD  natalizumab (TYSABRI) 300 MG/15ML injection Inject into the vein.  [provider]  sulfamethoxazole-trimethoprim (BACTRIM DS,SEPTRA DS) 800-160 MG tablet Take 1 tablet by mouth 2 (two) times daily. 06/22/18   Susy Frizzle, MD    Family History Family History  Problem Relation Age of Onset  . Ulcerative colitis Father   . Heart disease Mother   . Prostate cancer Paternal Grandfather   . Diabetes Paternal Grandmother   . Colon cancer Neg Hx   . Rectal cancer Neg Hx   . Stomach cancer Neg Hx     Social History Social History   Tobacco Use  . Smoking status: Former Smoker    Types: Cigarettes    Last attempt to quit: 10/24/2004      Years since quitting: 13.6  . Smokeless tobacco: Never Used  Substance Use Topics  . Alcohol use: No  . Drug use: No     Allergies   Lialda [mesalamine]   Review of Systems Review of Systems  Constitutional: Positive for fever. Negative for appetite change, diaphoresis, fatigue and unexpected weight change.  HENT: Negative for mouth sores.   Eyes: Negative for visual disturbance.  Respiratory: Negative for cough, chest tightness, shortness of breath and wheezing.   Cardiovascular: Negative for chest pain.  Gastrointestinal: Negative for abdominal pain, constipation, diarrhea, nausea and vomiting.  Endocrine: Negative for polydipsia, polyphagia and polyuria.  Genitourinary: Negative for dysuria, frequency, hematuria and urgency.  Musculoskeletal: Positive for arthralgias and joint swelling. Negative for back pain and neck stiffness.  Skin: Positive for color change and wound. Negative for rash.  Allergic/Immunologic: Negative for immunocompromised state.  Neurological: Negative for syncope, light-headedness and headaches.  Hematological: Does not bruise/bleed easily.  Psychiatric/Behavioral: Negative for sleep disturbance. The patient is not nervous/anxious.      Physical Exam Updated Vital Signs BP 125/72 (BP Location: Right Arm)   Pulse 89   Temp 98.8 F (37.1 C) (Oral)   Resp 16   Ht 5' 11"  (1.803 m)   Wt 93.9 kg   SpO2 96%   BMI 28.87 kg/m   Physical Exam  Constitutional: He appears well-developed and well-nourished. No distress.  Awake, alert, nontoxic appearance  HENT:  Head: Normocephalic and atraumatic.  Mouth/Throat: Oropharynx is clear and moist. No oropharyngeal exudate.  Eyes: Conjunctivae are normal. No scleral icterus.  Neck: Normal range of motion. Neck supple.  Cardiovascular: Normal rate, regular rhythm and intact distal pulses.  Pulmonary/Chest: Effort normal and breath sounds normal. No respiratory distress. He has no wheezes.  Equal chest  expansion  Abdominal: Soft. Bowel sounds are normal. He exhibits no mass. There is no tenderness. There is no rebound and no guarding.  Musculoskeletal: Normal range of motion. He exhibits no edema.  Left arm is edematous, erythematous and warm to touch.  Site of I&D has packing retained in place and continues to drain purulent fluid.  Almost full range of motion of the left elbow with some pain.  See photos.  Neurological: He is alert.  Speech is clear and goal oriented Moves extremities without ataxia Sensation in the left upper extremity is intact.  Strength is 5/5 in the left upper extremity  Skin: Skin is warm and dry. He is not diaphoretic. There is erythema.  Psychiatric: He has a normal mood and affect.  Nursing note and vitals reviewed.          ED Treatments / Results  Labs (all labs ordered are listed, but only abnormal results are displayed) Labs Reviewed  COMPREHENSIVE METABOLIC PANEL - Abnormal; Notable for the  following components:      Result Value   Glucose, Bld 138 (*)    Creatinine, Ser 1.25 (*)    All other components within normal limits  CBC WITH DIFFERENTIAL/PLATELET - Abnormal; Notable for the following components:   Hemoglobin 12.8 (*)    All other components within normal limits  CULTURE, BLOOD (ROUTINE X 2)  CULTURE, BLOOD (ROUTINE X 2)  I-STAT CG4 LACTIC ACID, ED  I-STAT CG4 LACTIC ACID, ED     Procedures Procedures (including critical care time)  Medications Ordered in ED Medications  vancomycin (VANCOCIN) IVPB 1000 mg/200 mL premix (has no administration in time range)  sodium chloride 0.9 % bolus 1,000 mL (has no administration in time range)     Initial Impression / Assessment and Plan / ED Course  I have reviewed the triage vital signs and the nursing notes.  Pertinent labs & imaging results that were available during my care of the patient were reviewed by me and considered in my medical decision making (see chart for  details).  Clinical Course as of Jun 23 2358  Sat Jun 23, 2018  2348 Thick acid is within normal limits.  Lactic Acid, Venous: 1.05 [HM]  2348 Patient is afebrile here in the emergency department without tachycardia or hypotension.  No evidence of sepsis.  Temp: 98.8 F (37.1 C) [HM]  2348 Blood Cultures obtained for initiating vancomycin.   [HM]  2348 Lightly elevated serum creatinine up from 1.19 approximately 4 weeks ago.  Fluids initiated.  Creatinine(!): 1.25 [HM]  2359 Discussed with Dr. Hampton Abbot who will admit   [HM]    Clinical Course User Index [HM] Ahmarion Saraceno, Gwenlyn Perking    Presents with worsening cellulitis and abscess of the left arm.  Less likely to be septic joint at this time.  He has failed outpatient therapy with Keflex and Bactrim.  Vancomycin initiated here in the emergency department.  Wound culture was obtained yesterday at his primary care provider's office.  Cultures obtained before vancomycin was given.  Patient will need admission for IV antibiotics.     Final Clinical Impressions(s) / ED Diagnoses   Final diagnoses:  Cellulitis of elbow  Left elbow pain    ED Discharge Orders    None       Loni Muse Gwenlyn Perking 06/23/18 2359    Fatima Blank, MD 06/24/18 [phone]

## 2018-06-24 ENCOUNTER — Other Ambulatory Visit: Payer: Self-pay

## 2018-06-24 ENCOUNTER — Encounter (HOSPITAL_COMMUNITY): Payer: Self-pay | Admitting: Internal Medicine

## 2018-06-24 ENCOUNTER — Observation Stay (HOSPITAL_COMMUNITY): Payer: Medicare Other

## 2018-06-24 DIAGNOSIS — L03119 Cellulitis of unspecified part of limb: Secondary | ICD-10-CM

## 2018-06-24 DIAGNOSIS — L03114 Cellulitis of left upper limb: Secondary | ICD-10-CM | POA: Diagnosis not present

## 2018-06-24 DIAGNOSIS — L0390 Cellulitis, unspecified: Secondary | ICD-10-CM | POA: Diagnosis present

## 2018-06-24 DIAGNOSIS — Q250 Patent ductus arteriosus: Secondary | ICD-10-CM

## 2018-06-24 DIAGNOSIS — A4902 Methicillin resistant Staphylococcus aureus infection, unspecified site: Secondary | ICD-10-CM | POA: Diagnosis present

## 2018-06-24 DIAGNOSIS — K5090 Crohn's disease, unspecified, without complications: Secondary | ICD-10-CM | POA: Insufficient documentation

## 2018-06-24 LAB — CREATININE, SERUM
CREATININE: 1.19 mg/dL (ref 0.61–1.24)
GFR calc Af Amer: >60 mL/min (ref >60)
GFR calc non Af Amer: >60 mL/min (ref >60)

## 2018-06-24 LAB — CBC WITH DIFFERENTIAL/PLATELET
Abs Immature Granulocytes: 0.1 10*3/uL (ref 0.0–0.1)
Basophils Absolute: 0 10*3/uL (ref 0.0–0.1)
Basophils Relative: 0 %
Eosinophils Absolute: 0.2 10*3/uL (ref 0.0–0.7)
Eosinophils Relative: 3 %
HEMATOCRIT: 37.8 % — AB (ref 39.0–52.0)
HEMOGLOBIN: 12.3 g/dL — AB (ref 13.0–17.0)
IMMATURE GRANULOCYTES: 1 %
LYMPHS ABS: 2.6 10*3/uL (ref 0.7–4.0)
LYMPHS PCT: 35 %
MCH: 28 pg (ref 26.0–34.0)
MCHC: 32.5 g/dL (ref 30.0–36.0)
MCV: 85.9 fL (ref 78.0–100.0)
Monocytes Absolute: 0.6 10*3/uL (ref 0.1–1.0)
Monocytes Relative: 8 %
NEUTROS ABS: 3.9 10*3/uL (ref 1.7–7.7)
NEUTROS PCT: 53 %
Platelets: 151 10*3/uL (ref 150–400)
RBC: 4.4 MIL/uL (ref 4.22–5.81)
RDW: 13.5 % (ref 11.5–15.5)
WBC: 7.4 10*3/uL (ref 4.0–10.5)

## 2018-06-24 LAB — BASIC METABOLIC PANEL
ANION GAP: 8 (ref 5–15)
BUN: 11 mg/dL (ref 6–20)
CALCIUM: 8.4 mg/dL — AB (ref 8.9–10.3)
CO2: 23 mmol/L (ref 22–32)
Chloride: 104 mmol/L (ref 98–111)
Creatinine, Ser: 1.16 mg/dL (ref 0.61–1.24)
GFR calc non Af Amer: >60 mL/min (ref >60)
Glucose, Bld: 124 mg/dL — ABNORMAL HIGH (ref 70–99)
Potassium: 3.6 mmol/L (ref 3.5–5.1)
Sodium: 135 mmol/L (ref 135–145)

## 2018-06-24 LAB — HIV ANTIBODY (ROUTINE TESTING W REFLEX): HIV Screen 4th Generation wRfx: NONREACTIVE

## 2018-06-24 MED ORDER — HYDROCODONE-ACETAMINOPHEN 5-325 MG PO TABS
1.0000 | ORAL_TABLET | Freq: Three times a day (TID) | ORAL | Status: DC | PRN
  Administered 2018-06-24 – 2018-06-25 (×2): 1 via ORAL
  Filled 2018-06-24 (×2): qty 1

## 2018-06-24 MED ORDER — ENOXAPARIN SODIUM 40 MG/0.4ML ~~LOC~~ SOLN
40.0000 mg | Freq: Every day | SUBCUTANEOUS | Status: DC
  Administered 2018-06-24 – 2018-06-26 (×3): 40 mg via SUBCUTANEOUS
  Filled 2018-06-24 (×3): qty 0.4

## 2018-06-24 MED ORDER — VANCOMYCIN HCL 500 MG IV SOLR: 500.0000 mg | Freq: Once | INTRAVENOUS | Status: DC

## 2018-06-24 MED ORDER — DOCUSATE SODIUM 100 MG PO CAPS
100.0000 mg | ORAL_CAPSULE | Freq: Every day | ORAL | Status: DC | PRN
  Administered 2018-06-24: 100 mg via ORAL
  Filled 2018-06-24: qty 1

## 2018-06-24 MED ORDER — SODIUM CHLORIDE 0.9 % IV BOLUS: 1000.0000 mL | Freq: Once | INTRAVENOUS | Status: DC

## 2018-06-24 MED ORDER — VANCOMYCIN HCL IN DEXTROSE 1-5 GM/200ML-% IV SOLN
1000.0000 mg | Freq: Three times a day (TID) | INTRAVENOUS | Status: DC
  Administered 2018-06-24 – 2018-06-25 (×4): 1000 mg via INTRAVENOUS
  Filled 2018-06-24 (×4): qty 200

## 2018-06-24 MED ORDER — ACETAMINOPHEN 325 MG PO TABS: 650.0000 mg | ORAL_TABLET | Freq: Four times a day (QID) | ORAL | Status: DC | PRN

## 2018-06-24 NOTE — H&P (Addendum)
History and Physical  Jonathon Vazquez WSF:[id number] DOB: Jan 10, 1971 DOA: 06/23/2018 2048   PCP: Susy Frizzle, MD   HISTORY   Chief Complaint: L arm cellulitis and recent L elbow abscess I&D  HPI: Jonathon Vazquez is a 47 y.o. male with hx of MRSA infection, Crohn's disease, who presented to ED for worsening L forearm and shoulder pain, fevers at home (up to 102). He had an L elbow abscess (initial presumed olecranon bursitis) that was drained and packed last week by his outpatient provider. Initially completed keflex course and then this past week was switched to bactrim. Since I&D the past week, noted minimal drainage from his L elbow site but has been increasing erythema, warmth and pain in his L forearm and up to his L shoulder. Some tenderness and pain over incision site but full range of motion intact and only minimal pain at elbow with movement per patient. Has been having chills and measured fevers up to 102 deg x 2 days. Of note, patient was first diagnosed with MRSA earlier this year after a puncture wound of his thigh from a nail. His thigh wound has since healed.   Review of Systems:  + fevers, L forearm and arm erythema, pain, swelling, recent L elbow I&D - no cough - no chest pain, dyspnea on exertion - no lower ext edema, PND, orthopnea - no nausea/vomiting; no tarry, melanotic or bloody stools - no dysuria, increased urinary frequency - no weight changes  Rest of systems reviewed are negative, except as per above history.   ED course:  Vitals Blood pressure 125/72, pulse 89, temperature 98.8 F (37.1 C), temperature source Oral, resp. rate 16, height 5' 11"  (1.803 m), weight 93.9 kg, SpO2 96 %. Received vancomycin 1000 mg x 1 and NS 1L bolus x 1.   Past Medical History:  Diagnosis Date  . Anal fistula 1996  . Anal stenosis   . Atrial fibrillation ([place])    pt and wife deny this  . Colon stricture (HCC)    descending colon  . Crohn's disease ([place])   . Hx of  colonic polyp 10/21/2016  . Multiple sclerosis ([place])   . Neuromuscular disorder ([place])   . Patent ductus arteriosus   . Regional enteritis of large intestine ([place])   . S/P left hemicolectomy 2015   [facility] for stricture  . Stricture of descending colon due to Crohn's disease 02/13/2012   Past Surgical History:  Procedure Laterality Date  . COLON SURGERY  2015   descending colon stricture   . COLONOSCOPY     multiple    Social History:  reports that he quit smoking about 13 years ago. His smoking use included cigarettes. He has never used smokeless tobacco. He reports that he does not drink alcohol or use drugs.  Allergies  Allergen Reactions  . Lialda [Mesalamine] Diarrhea    Severe diarrhea    Family History  Problem Relation Age of Onset  . Ulcerative colitis Father   . Heart disease Mother   . Prostate cancer Paternal Grandfather   . Diabetes Paternal Grandmother   . Colon cancer Neg Hx   . Rectal cancer Neg Hx   . Stomach cancer Neg Hx       Prior to Admission medications   Medication Sig Start Date End Date Taking? Authorizing Provider  acetaminophen (TYLENOL) 325 MG tablet Take 650 mg by mouth every 6 (six) hours as needed for mild pain.    Yes [provider]  HYDROcodone-acetaminophen (NORCO/VICODIN) 5-325 MG tablet Take 1 tablet by mouth every 6 (six) hours as needed for moderate pain. 06/22/18  Yes Susy Frizzle, MD  natalizumab (TYSABRI) 300 MG/15ML injection Inject into the vein. 1 hour infusion monthly   Yes [provider]  sulfamethoxazole-trimethoprim (BACTRIM DS,SEPTRA DS) 800-160 MG tablet Take 1 tablet by mouth 2 (two) times daily. 06/22/18  Yes Susy Frizzle, MD    Temp:  [98.8 F (37.1 C)] 98.8 F (37.1 C) (08/31 2051) Pulse Rate:  [89] 89 (08/31 2051) Resp:  [16] 16 (08/31 2051) BP: (125)/(72) 125/72 (08/31 2051) SpO2:  [96 %] 96 % (08/31 2051) Weight:  [93.9 kg] 93.9 kg (08/31 2100)  PHYSICAL EXAM   BP 125/72 (BP  Location: Right Arm)   Pulse 89   Temp 98.8 F (37.1 C) (Oral)   Resp 16   Ht 5' 11"  (1.803 m)   Wt 93.9 kg   SpO2 96%   BMI 28.87 kg/m    GEN morbidly obese middle-aged african-american male; resting comfortably in bed  HEENT NCAT EOM PERRL; clear oropharynx, no cervical LAD; moist mucus membranes  JVP estimated 5 cm H2O above RA; no HJR ; no carotid bruits b/l ;  CV regular normal rate; normal S1 and S2; no m/r/g or S3/S4; PMI non displaced; no parasternal heave  RESP CTA b/l; breathing unlabored and symmetric  ABD soft NT ND +normoactive BS  EXT warm throughout b/l; no lower extremity edema b/l  PULSES  DP and radials intact b/l SKIN/MSK no rashes L forearm/arm/elbow: erythematous and warm up to wrist; no tenderness or pain with hand/finger joint movement. Full ROM of L elbow. 2.5 cm long incision site at L elbow with packing material visible. Small amount serosanguinous drainage from site. Warmth extends to mid upper L arm. Faint erythematous tracking visible on dorsal left forearm up to mid forearm.  NEURO/PSYCH AAOx4; no focal deficits   DATA   LABS ON ADMISSION:  Basic Metabolic Panel: Recent Labs  Lab 06/23/18 2107  NA 136  K 3.7  CL 101  CO2 25  GLUCOSE 138*  BUN 12  CREATININE 1.25*  CALCIUM 9.0   CBC: Recent Labs  Lab 06/23/18 2107  WBC 10.0  NEUTROABS 6.3  HGB 12.8*  HCT 40.0  MCV 86.2  PLT 170   Liver Function Tests: Recent Labs  Lab 06/23/18 2107  AST 23  ALT 33  ALKPHOS 88  BILITOT 1.1  PROT 7.1  ALBUMIN 3.7   No results for input(s): LIPASE, AMYLASE in the last 168 hours. No results for input(s): AMMONIA in the last 168 hours. Coagulation:  No results found for: INR, PROTIME No results found for: PTT Lactic Acid, Venous:     Component Value Date/Time   LATICACIDVEN 1.05 06/23/2018 2127   Cardiac Enzymes: No results for input(s): CKTOTAL, CKMB, CKMBINDEX, TROPONINI in the last 168 hours. Urinalysis: No results found for:  COLORURINE, APPEARANCEUR, LABSPEC, PHURINE, GLUCOSEU, HGBUR, BILIRUBINUR, KETONESUR, PROTEINUR, UROBILINOGEN, NITRITE, LEUKOCYTESUR  BNP (last 3 results) No results for input(s): PROBNP in the last 8760 hours. CBG: No results for input(s): GLUCAP in the last 168 hours.  Radiological Exams on Admission: No results found.  EKG: Independently reviewed. NSR   ASSESSMENT AND PLAN   Assessment: Jonathon Vazquez is a 47 y.o. male with with hx of MRSA infection who presents with complicated suppurative L upper extremity cellulitis, likely seeded from L elbow I&D site. No evidence of joint infection on  exam at this time. Febrile at home but overall non toxic appearing on admission. Will obtain plain films of L elbow and continue treating with IV vancomycin.   Active Problems:   Cellulitis  Plan:   # Suppurative complicated L upper extremity cellulitis > no organisms seen on wound culture from L elbow aspiration - continue IV vancomycin per pharmacy; plan to transition to oral abx with MRSA coverage on discharge - wound care to manage L elbow packing  - pain control with hydrocdone-tyelnol - NS bolus as needed (received 2L at ED) - blood cultures in process - L elbow xray ordered  # History of Crohn's - no evidence of flare, nothing to do at this time   DVT Prophylaxis: lovenox Code Status:  Full Code Family Communication: none  Disposition Plan: admit to observation, pending improvement of cellulitis  Extended Emergency Contact Information Primary Emergency Contact: Smithey,[name] Address: [address] [place], [place] Phone: [phone] Work Phone: [phone] Relation: Other  Time spent: > 35 minutes  Colbert Ewing, MD [facility] Pager [phone]  If 7PM-7AM, please contact night-coverage [email or web address] Password [email or web address] 06/24/2018, 12:26 AM

## 2018-06-24 NOTE — Progress Notes (Signed)
Pharmacy Antibiotic Note  Jonathon Vazquez is a 47 y.o. male admitted on 06/23/2018 with LUE cellulitis.  Pharmacy has been consulted for Vancomycin  Dosing.  Vancomycin 1 g IV given in ED at  midnight  Plan: Vancomycin 1 g IV q8h  Height: 5' 11"  (180.3 cm) Weight: 207 lb (93.9 kg) IBW/kg (Calculated) : 75.3  Temp (24hrs), Avg:98.3 F (36.8 C), Min:97.8 F (36.6 C), Max:98.8 F (37.1 C)  Recent Labs  Lab 06/23/18 2107 06/23/18 2127  WBC 10.0  --   CREATININE 1.25*  --   LATICACIDVEN  --  1.05    Estimated Creatinine Clearance: 84.5 mL/min (A) (by C-G formula based on SCr of 1.25 mg/dL (H)).    Allergies  Allergen Reactions  . Lialda [Mesalamine] Diarrhea    Severe diarrhea    Caryl Pina 06/24/2018 2:12 AM

## 2018-06-24 NOTE — Progress Notes (Signed)
Patient and family member (wife) were educated on waiting for patient to wait until getting into shower due to needing order from MD. Also, MD should see arm before patient gets into shower. Wife refused, and placed patient into shower by herself and stated she works in healthcare. RN re-educated both individuals, they verbalized understanding. Will notify MD of situation.

## 2018-06-24 NOTE — Progress Notes (Signed)
[facility] PROGRESS NOTE  Jonathon Vazquez:[id number] DOB: 1971-06-21 DOA: 06/23/2018 PCP: Susy Frizzle, MD  Assessment/Plan:  #1. Cellulitis. Pt with hx MRSA July 2019. S/P I&D with wick 4 days ago. Worsening swelling heat erythema. Xray with No acute osseous pathology. Diffuse subcutaneous edema may represent cellulitis. Afebrile no leukocytosis non-toxic appearing. Lactic acid within limits of normal. Culture with staph aureas.  -continue IV vanc -follow blood culture -wound consult  #2. MS. Stable at baseline. Home meds include tysabri. Due 07/06/18  #3. Chron's disease. S/p resection. Stable at baseline   Code Status: full Family Communication: none present Disposition Plan: home hopefully 24 hours   Consultants:  wound  Procedures:  none  Antibiotics:  Vancomycin 8/31>>>  HPI/Subjective: Pt with hx chron's and MS as well as MRSA last month presented with worsening left elbow/arm swelling erythema. He reports last week he "scraped" his left elbow. He then "picked at scab" for several days. Developed swelling and redness left upper arm and left elbow with abscess. PCP performed I&D 4 days ago with wick. Instructed patient to "pull it out a little each day".  Arm swelling/pain/erythema worsened to include lower arm. Developed fever max 102. Provided with keflex and bactrim. Has not changed dressing or pulled wick.   Objective: Vitals:   06/24/18 0658 06/24/18 0811  BP: (!) 106/53 110/73  Pulse: (!) 57 62  Resp: 16   Temp: 97.9 F (36.6 C) 98.5 F (36.9 C)  SpO2: 99% 99%    Intake/Output Summary (Last 24 hours) at 06/24/2018 1000 Last data filed at 06/24/2018 0410 Gross per 24 hour  Intake 1000 ml  Output -  Net 1000 ml   Filed Weights   06/23/18 2051 06/23/18 2100  Weight: 93.9 kg 93.9 kg    Exam:   General:  Sitting up in bed watching TV in no acute distress  Cardiovascular: RRR no MGR no LE edema  Respiratory: normal effort BS clear  bilaterally no wheeze  Abdomen: flat soft +BS no guarding or rebounding  Musculoskeletal: left arm/hand with swelling to mid bicept. Erythema and heat posterior upper arm above elbow. Full rom. Non-tender. Left elbow with open wound and wick out. Moderate amount serosanguinous drainage with white matter expelled with palpation. Very faint odor.  Data Reviewed: Basic Metabolic Panel: Recent Labs  Lab 06/23/18 2107 06/24/18 0219  NA 136 135  K 3.7 3.6  CL 101 104  CO2 25 23  GLUCOSE 138* 124*  BUN 12 11  CREATININE 1.25* 1.16  1.19  CALCIUM 9.0 8.4*   Liver Function Tests: Recent Labs  Lab 06/23/18 2107  AST 23  ALT 33  ALKPHOS 88  BILITOT 1.1  PROT 7.1  ALBUMIN 3.7   No results for input(s): LIPASE, AMYLASE in the last 168 hours. No results for input(s): AMMONIA in the last 168 hours. CBC: Recent Labs  Lab 06/23/18 2107 06/24/18 0219  WBC 10.0 7.4  NEUTROABS 6.3 3.9  HGB 12.8* 12.3*  HCT 40.0 37.8*  MCV 86.2 85.9  PLT 170 151   Cardiac Enzymes: No results for input(s): CKTOTAL, CKMB, CKMBINDEX, TROPONINI in the last 168 hours. BNP (last 3 results) No results for input(s): BNP in the last 8760 hours.  ProBNP (last 3 results) No results for input(s): PROBNP in the last 8760 hours.  CBG: No results for input(s): GLUCAP in the last 168 hours.  Recent Results (from the past 240 hour(s))  WOUND CULTURE     Status: Abnormal (Preliminary result)  Collection Time: 06/22/18  4:44 PM  Result Value Ref Range Status   MICRO NUMBER: [id number]  Preliminary   SPECIMEN QUALITY: ADEQUATE  Preliminary   SOURCE: NOT GIVEN  Preliminary   STATUS: PRELIMINARY  Preliminary   GRAM STAIN:   Preliminary    Many White blood cells seen No epithelial cells seen No organisms seen   ISOLATE 1: Staphylococcus aureus (A)  Preliminary    Comment: Light growth of Staphylococcus aureus , susceptibility test report to follow.  WOUND CULTURE     Status: None (Preliminary result)    Collection Time: 06/22/18  4:46 PM  Result Value Ref Range Status   MICRO NUMBER: [id number]  Preliminary   SPECIMEN QUALITY: ADEQUATE  Preliminary   SOURCE: NOT GIVEN  Preliminary   STATUS: PRELIMINARY  Preliminary   GRAM STAIN: Rare White blood cells seen No organisms seen  Preliminary   RESULT: No Growth  Preliminary  Blood culture (routine x 2)     Status: None (Preliminary result)   Collection Time: 06/23/18 11:50 PM  Result Value Ref Range Status   Specimen Description BLOOD RIGHT HAND  Final   Special Requests   Final    BOTTLES DRAWN AEROBIC AND ANAEROBIC Blood Culture adequate volume   Culture PENDING  Incomplete   Report Status PENDING  Incomplete     Studies: Dg Elbow Complete Left (3+view)  Result Date: 06/24/2018 CLINICAL DATA:  47 year old male with left elbow infection. EXAM: LEFT ELBOW - COMPLETE 3+ VIEW COMPARISON:  None. FINDINGS: There is no acute fracture or dislocation. The bones are well mineralized. No arthritic changes. No periosteal reaction or bone erosion. No joint effusion. However evaluation is limited as a true 90 degree lateral view is not provided. There is diffuse subcutaneous edema. IMPRESSION: 1. No acute osseous pathology. 2. Diffuse subcutaneous edema may represent cellulitis. Clinical correlation is recommended. Electronically Signed   By: Anner Crete M.D.   On: 06/24/2018 06:02    Scheduled Meds: . enoxaparin (LOVENOX) injection  40 mg Subcutaneous Daily   Continuous Infusions: . sodium chloride    . vancomycin 1,000 mg (06/24/18 0410)    Principal Problem:   Cellulitis Active Problems:   Crohn's colitis- hx strictures and resection   Multiple sclerosis (HCC)   Patent ductus arteriosus   MRSA (methicillin resistant Staphylococcus aureus)    Time spent: 73 minutes    [facility]  If 7PM-7AM, please contact night-coverage at [email or web address], password [email or web address] 06/24/2018, 10:00 AM  LOS: 0 days

## 2018-06-24 NOTE — Progress Notes (Signed)
Patient and spouse asked if he could take a shower. Patient was advised to not get into shower until seen by doctor. Patients spouse stated that she was going to go ahead and get him in the shower even though doctor had not been around to look at it. Verdia Kuba, RN 06/24/2018 10:38 AM

## 2018-06-24 NOTE — Consult Note (Signed)
[facility] Nurse wound consult note Reason for Consult: left elbow abscess, drained by PCP in community. History of MRSA from right hip wound (?abscess) 4 months ago. Wound type: infectious Pressure Injury POA: N/A Measurement: 0.45cm round with 1.5cm undermining at 6 o'clock Wound bed:red, moist Drainage (amount, consistency, odor) yellow, thin, small amount with musty odor Periwound:warm, mild induration, erythema and peeling of epidermis Dressing procedure/placement/frequency: I will implement wound packing using 1/4 inch iodoform to prevent premature closure and facilitate complete drainage using an antimicrobial wick. May consider consulting ID for input or ortho to widen wound and facilitate drainage.I defer to the expertise of those colleagues for further guidance to [place].  [facility] nursing team will not follow, but will remain available to this patient, the nursing and medical teams.  Please re-consult if needed. Thanks, Maudie Flakes, MSN, RN, [place], Arther Abbott  Pager# [phone]

## 2018-06-24 NOTE — ED Notes (Signed)
No addl blood draw,  Pt enroute to inpatient floor.

## 2018-06-25 ENCOUNTER — Encounter (HOSPITAL_COMMUNITY): Payer: Self-pay | Admitting: General Practice

## 2018-06-25 DIAGNOSIS — M79602 Pain in left arm: Secondary | ICD-10-CM | POA: Diagnosis present

## 2018-06-25 DIAGNOSIS — Z9049 Acquired absence of other specified parts of digestive tract: Secondary | ICD-10-CM | POA: Diagnosis not present

## 2018-06-25 DIAGNOSIS — K5010 Crohn's disease of large intestine without complications: Secondary | ICD-10-CM | POA: Diagnosis present

## 2018-06-25 DIAGNOSIS — M7022 Olecranon bursitis, left elbow: Secondary | ICD-10-CM | POA: Diagnosis present

## 2018-06-25 DIAGNOSIS — L03119 Cellulitis of unspecified part of limb: Secondary | ICD-10-CM | POA: Diagnosis not present

## 2018-06-25 DIAGNOSIS — Q250 Patent ductus arteriosus: Secondary | ICD-10-CM | POA: Diagnosis not present

## 2018-06-25 DIAGNOSIS — Z888 Allergy status to other drugs, medicaments and biological substances status: Secondary | ICD-10-CM | POA: Diagnosis not present

## 2018-06-25 DIAGNOSIS — G35 Multiple sclerosis: Secondary | ICD-10-CM | POA: Diagnosis present

## 2018-06-25 DIAGNOSIS — Z8614 Personal history of Methicillin resistant Staphylococcus aureus infection: Secondary | ICD-10-CM | POA: Diagnosis not present

## 2018-06-25 DIAGNOSIS — Z79899 Other long term (current) drug therapy: Secondary | ICD-10-CM | POA: Diagnosis not present

## 2018-06-25 DIAGNOSIS — Z87891 Personal history of nicotine dependence: Secondary | ICD-10-CM | POA: Diagnosis not present

## 2018-06-25 DIAGNOSIS — I4891 Unspecified atrial fibrillation: Secondary | ICD-10-CM | POA: Diagnosis present

## 2018-06-25 DIAGNOSIS — L02414 Cutaneous abscess of left upper limb: Secondary | ICD-10-CM | POA: Diagnosis present

## 2018-06-25 DIAGNOSIS — L03114 Cellulitis of left upper limb: Secondary | ICD-10-CM | POA: Diagnosis not present

## 2018-06-25 DIAGNOSIS — Z86010 Personal history of colonic polyps: Secondary | ICD-10-CM | POA: Diagnosis not present

## 2018-06-25 LAB — CBC WITH DIFFERENTIAL/PLATELET
Abs Immature Granulocytes: 0.1 10*3/uL (ref 0.0–0.1)
BASOS ABS: 0.1 10*3/uL (ref 0.0–0.1)
Basophils Relative: 1 %
EOS ABS: 0.2 10*3/uL (ref 0.0–0.7)
Eosinophils Relative: 4 %
HEMATOCRIT: 41.5 % (ref 39.0–52.0)
HEMOGLOBIN: 13.4 g/dL (ref 13.0–17.0)
IMMATURE GRANULOCYTES: 2 %
LYMPHS ABS: 2.4 10*3/uL (ref 0.7–4.0)
LYMPHS PCT: 41 %
MCH: 27.6 pg (ref 26.0–34.0)
MCHC: 32.3 g/dL (ref 30.0–36.0)
MCV: 85.4 fL (ref 78.0–100.0)
Monocytes Absolute: 0.6 10*3/uL (ref 0.1–1.0)
Monocytes Relative: 10 %
NEUTROS PCT: 42 %
Neutro Abs: 2.4 10*3/uL (ref 1.7–7.7)
Platelets: 162 10*3/uL (ref 150–400)
RBC: 4.86 MIL/uL (ref 4.22–5.81)
RDW: 13.6 % (ref 11.5–15.5)
WBC: 5.8 10*3/uL (ref 4.0–10.5)

## 2018-06-25 LAB — BASIC METABOLIC PANEL
ANION GAP: 5 (ref 5–15)
BUN: 7 mg/dL (ref 6–20)
CALCIUM: 8.9 mg/dL (ref 8.9–10.3)
CO2: 28 mmol/L (ref 22–32)
Chloride: 106 mmol/L (ref 98–111)
Creatinine, Ser: 1.03 mg/dL (ref 0.61–1.24)
GFR calc non Af Amer: >60 mL/min (ref >60)
Glucose, Bld: 105 mg/dL — ABNORMAL HIGH (ref 70–99)
Potassium: 4.3 mmol/L (ref 3.5–5.1)
SODIUM: 139 mmol/L (ref 135–145)

## 2018-06-25 LAB — WOUND CULTURE
MICRO NUMBER: 91043150
MICRO NUMBER:: 91041677
RESULT: NO GROWTH
SPECIMEN QUALITY:: ADEQUATE
SPECIMEN QUALITY:: ADEQUATE

## 2018-06-25 LAB — VANCOMYCIN, TROUGH: VANCOMYCIN TR: 18 ug/mL (ref 15–20)

## 2018-06-25 MED ORDER — VANCOMYCIN HCL 10 G IV SOLR
1250.0000 mg | Freq: Two times a day (BID) | INTRAVENOUS | Status: DC
  Administered 2018-06-25 – 2018-06-26 (×2): 1250 mg via INTRAVENOUS
  Filled 2018-06-25 (×4): qty 1250

## 2018-06-25 NOTE — Progress Notes (Addendum)
Pharmacy Antibiotic Note  Jonathon Vazquez is a 47 y.o. male admitted on 06/23/2018 with worsening LUE cellulitis not responsive to outpatient treatment (Keflex and Bactrim). Pharmacy has been consulted for Vancomycin  Dosing.  Current antibiotic treatment regimen vancomycin 1 g IV q8 hours. Initial vancomycin trough drawn about 1.5 hours early and resulted this morning at 18. If appropriately drawn, vancomycin trough would likely be closer to goal of 10-15 but still supratherapeutic. Serum creatinine decreased this morning, and renal function returning to patient's baseline.   Plan: Decrease vancomycin to 1275m IV q12h  Height: 5' 11"  (180.3 cm) Weight: 207 lb (93.9 kg) IBW/kg (Calculated) : 75.3  Temp (24hrs), Avg:98.2 F (36.8 C), Min:97.9 F (36.6 C), Max:98.5 F (36.9 C)  Recent Labs  Lab 06/23/18 2107 06/23/18 2127 06/24/18 0219 06/25/18 0417  WBC 10.0  --  7.4 5.8  CREATININE 1.25*  --  1.16  1.19 1.03  LATICACIDVEN  --  1.05  --   --   VANCOTROUGH  --   --   --  18    Estimated Creatinine Clearance: 102.6 mL/min (by C-G formula based on SCr of 1.03 mg/dL).    Allergies  Allergen Reactions  . Lialda [Mesalamine] Diarrhea    Severe diarrhea    ABrendolyn Patty PharmD PGY1 Pharmacy Resident Phone [phone] 06/25/2018   7:40 AM

## 2018-06-25 NOTE — Consult Note (Signed)
[facility] (OTS) Consult   Patient ID: KORD MONETTE MRN: [id number] DOB/AGE: 48-Nov-1972 48 y.o.  Reason for Consult:Left elbow cellulitis/abscess Referring Physician: Vladimir Faster, NP [facility]  HPI: ELFORD EVILSIZER is an 47 y.o. male who is being seen in consultation at the request of nurse practitioner Tamala Julian for evaluation of left elbow cellulitis/abscess.  The patient was admitted on 8/31 with concerns about cellulitis and abscess formation about his elbow.  The patient was initially seen in his primary care's office on August 28.  He was prescribed Keflex for olecranon bursitis.  He returned on 830 with worsening swelling and erythema with a fever of 101.  The primary care physician performed an I&D and was put on Bactrim double strength.  He presented to the emergency room on August 31 with worsening symptoms.  He was admitted for IV antibiotics.  There is concern that he was not improving as quickly as they would like and they are concerned about need for possible further I&D.  Patient states that it feels better on IV antibiotics.  And has no pain to move the elbow.  He has a small area of slight drainage.  Otherwise he denies any fevers or chills.  Denies any numbness or tingling.  Past Medical History:  Diagnosis Date  . Anal fistula 1996  . Anal stenosis   . Atrial fibrillation ([place])    pt and wife deny this  . Colon stricture (HCC)    descending colon  . Crohn's disease ([place])   . Hx of colonic polyp 10/21/2016  . MRSA (methicillin resistant Staphylococcus aureus)   . Multiple sclerosis ([place])   . Neuromuscular disorder ([place])   . Patent ductus arteriosus   . Regional enteritis of large intestine ([place])   . S/P left hemicolectomy 2015   [facility]-done for stricture  . Stricture of descending colon due to Crohn's disease 02/13/2012    Past Surgical History:  Procedure Laterality Date  . COLON SURGERY  2015   descending colon stricture   . COLONOSCOPY      multiple    Family History  Problem Relation Age of Onset  . Ulcerative colitis Father   . Heart disease Mother   . Prostate cancer Paternal Grandfather   . Diabetes Paternal Grandmother   . Colon cancer Neg Hx   . Rectal cancer Neg Hx   . Stomach cancer Neg Hx     Social History:  reports that he quit smoking about 13 years ago. His smoking use included cigarettes. He has never used smokeless tobacco. He reports that he does not drink alcohol or use drugs.  Allergies:  Allergies  Allergen Reactions  . Lialda [Mesalamine] Diarrhea    Severe diarrhea    Medications:  No current facility-administered medications on file prior to encounter.    Current Outpatient Medications on File Prior to Encounter  Medication Sig Dispense Refill  . acetaminophen (TYLENOL) 325 MG tablet Take 650 mg by mouth every 6 (six) hours as needed for mild pain.     Marland Kitchen HYDROcodone-acetaminophen (NORCO/VICODIN) 5-325 MG tablet Take 1 tablet by mouth every 6 (six) hours as needed for moderate pain. 10 tablet 0  . natalizumab (TYSABRI) 300 MG/15ML injection Inject into the vein. 1 hour infusion monthly    . sulfamethoxazole-trimethoprim (BACTRIM DS,SEPTRA DS) 800-160 MG tablet Take 1 tablet by mouth 2 (two) times daily. 20 tablet 0    ROS: Constitutional: No fever or chills Vision: No changes in vision ENT: No  difficulty swallowing CV: No chest pain Pulm: No SOB or wheezing GI: No nausea or vomiting GU: No urgency or inability to hold urine Skin: No poor wound healing Neurologic: No numbness or tingling Psychiatric: No depression or anxiety Heme: No bruising Allergic: No reaction to medications or food   Exam: Blood pressure 122/70, pulse 75, temperature 98.5 F (36.9 C), temperature source Oral, resp. rate 16, height 5' 11" (1.803 m), weight 93.9 kg, SpO2 99 %. General: No acute distress Orientation: Awake alert and oriented x3 Mood and Affect: Cooperative and pleasant Gait: Within normal  limits Coordination and balance: Within normal limits  Left upper extremity: Reveals a small area of open wound with a small amount of serous drainage.  There is a area of surrounding induration with minimal erythema.  There is notable tenderness to palpation however I was not able to express any fluid.  It does not fully feel fluctuant but more hard and indurated than anything.  Patient has full elbow range of motion with no pain.  He is otherwise neurovascularly intact.  Right upper extremity: Skin without lesions. No tenderness to palpation. Full painless ROM, full strength in each muscle groups without evidence of instability.   Medical Decision Making: Imaging: X-rays of the left elbow are reviewed.  Shows soft tissue swelling.  No signs of bony or joint involvement.  Labs:  Results for orders placed or performed during the hospital encounter of 06/23/18 (from the past 48 hour(s))  Comprehensive metabolic panel     Status: Abnormal   Collection Time: 06/23/18  9:07 PM  Result Value Ref Range   Sodium 136 135 - 145 mmol/L   Potassium 3.7 3.5 - 5.1 mmol/L   Chloride 101 98 - 111 mmol/L   CO2 25 22 - 32 mmol/L   Glucose, Bld 138 (H) 70 - 99 mg/dL   BUN 12 6 - 20 mg/dL   Creatinine, Ser 1.25 (H) 0.61 - 1.24 mg/dL   Calcium 9.0 8.9 - 10.3 mg/dL   Total Protein 7.1 6.5 - 8.1 g/dL   Albumin 3.7 3.5 - 5.0 g/dL   AST 23 15 - 41 U/L   ALT 33 0 - 44 U/L   Alkaline Phosphatase 88 38 - 126 U/L   Total Bilirubin 1.1 0.3 - 1.2 mg/dL   GFR calc non Af Amer >60 >60 mL/min   GFR calc Af Amer >60 >60 mL/min    Comment: (NOTE) The eGFR has been calculated using the CKD EPI equation. This calculation has not been validated in all clinical situations. eGFR's persistently <60 mL/min signify possible Chronic Kidney Disease.    Anion gap 10 5 - 15    Comment: Performed at [facility] [address], [place], [place] [address]  CBC with Differential     Status: Abnormal   Collection Time:  06/23/18  9:07 PM  Result Value Ref Range   WBC 10.0 4.0 - 10.5 K/uL   RBC 4.64 4.22 - 5.81 MIL/uL   Hemoglobin 12.8 (L) 13.0 - 17.0 g/dL   HCT 40.0 39.0 - 52.0 %   MCV 86.2 78.0 - 100.0 fL   MCH 27.6 26.0 - 34.0 pg   MCHC 32.0 30.0 - 36.0 g/dL   RDW 13.6 11.5 - 15.5 %   Platelets 170 150 - 400 K/uL   Neutrophils Relative % 63 %   Neutro Abs 6.3 1.7 - 7.7 K/uL   Lymphocytes Relative 27 %   Lymphs Abs 2.8 0.7 - 4.0  K/uL   Monocytes Relative 7 %   Monocytes Absolute 0.7 0.1 - 1.0 K/uL   Eosinophils Relative 2 %   Eosinophils Absolute 0.2 0.0 - 0.7 K/uL   Basophils Relative 0 %   Basophils Absolute 0.0 0.0 - 0.1 K/uL   Immature Granulocytes 1 %   Abs Immature Granulocytes 0.1 0.0 - 0.1 K/uL    Comment: Performed at [facility] [address], [place], [place] [address]  I-Stat CG4 Lactic Acid, ED     Status: None   Collection Time: 06/23/18  9:27 PM  Result Value Ref Range   Lactic Acid, Venous 1.05 0.5 - 1.9 mmol/L  Blood culture (routine x 2)     Status: None (Preliminary result)   Collection Time: 06/23/18 11:50 PM  Result Value Ref Range   Specimen Description BLOOD RIGHT ANTECUBITAL    Special Requests      BOTTLES DRAWN AEROBIC AND ANAEROBIC Blood Culture adequate volume   Culture      NO GROWTH 1 DAY Performed at [facility], [place] [address] [place], [place] [address]    Report Status PENDING   Blood culture (routine x 2)     Status: None (Preliminary result)   Collection Time: 06/23/18 11:50 PM  Result Value Ref Range   Specimen Description BLOOD RIGHT HAND    Special Requests      BOTTLES DRAWN AEROBIC AND ANAEROBIC Blood Culture adequate volume   Culture      NO GROWTH 1 DAY Performed at [facility], [place] [address] [place], [place] [address]    Report Status PENDING   HIV antibody (Routine Testing)     Status: None   Collection Time: 06/24/18  2:19 AM  Result Value Ref Range   HIV Screen 4th Generation wRfx Non Reactive Non Reactive     Comment: (NOTE) Performed At: [facility] [address] [place], [place] [id number] Rush Farmer MD YK:[id number]   Creatinine, serum     Status: None   Collection Time: 06/24/18  2:19 AM  Result Value Ref Range   Creatinine, Ser 1.19 0.61 - 1.24 mg/dL   GFR calc non Af Amer >60 >60 mL/min   GFR calc Af Amer >60 >60 mL/min    Comment: (NOTE) The eGFR has been calculated using the CKD EPI equation. This calculation has not been validated in all clinical situations. eGFR's persistently <60 mL/min signify possible Chronic Kidney Disease. Performed at [facility], [place] [address], [place], [place] [address]   CBC with Differential/Platelet     Status: Abnormal   Collection Time: 06/24/18  2:19 AM  Result Value Ref Range   WBC 7.4 4.0 - 10.5 K/uL   RBC 4.40 4.22 - 5.81 MIL/uL   Hemoglobin 12.3 (L) 13.0 - 17.0 g/dL   HCT 37.8 (L) 39.0 - 52.0 %   MCV 85.9 78.0 - 100.0 fL   MCH 28.0 26.0 - 34.0 pg   MCHC 32.5 30.0 - 36.0 g/dL   RDW 13.5 11.5 - 15.5 %   Platelets 151 150 - 400 K/uL   Neutrophils Relative % 53 %   Neutro Abs 3.9 1.7 - 7.7 K/uL   Lymphocytes Relative 35 %   Lymphs Abs 2.6 0.7 - 4.0 K/uL   Monocytes Relative 8 %   Monocytes Absolute 0.6 0.1 - 1.0 K/uL   Eosinophils Relative 3 %   Eosinophils Absolute 0.2 0.0 - 0.7 K/uL   Basophils Relative 0 %  Basophils Absolute 0.0 0.0 - 0.1 K/uL   Immature Granulocytes 1 %   Abs Immature Granulocytes 0.1 0.0 - 0.1 K/uL    Comment: Performed at [facility], [place] [address], [place], [place] [address]  Basic metabolic panel     Status: Abnormal   Collection Time: 06/24/18  2:19 AM  Result Value Ref Range   Sodium 135 135 - 145 mmol/L   Potassium 3.6 3.5 - 5.1 mmol/L   Chloride 104 98 - 111 mmol/L   CO2 23 22 - 32 mmol/L   Glucose, Bld 124 (H) 70 - 99 mg/dL   BUN 11 6 - 20 mg/dL   Creatinine, Ser 1.16 0.61 - 1.24 mg/dL   Calcium 8.4 (L) 8.9 - 10.3 mg/dL   GFR calc non Af Amer >60 >60 mL/min    GFR calc Af Amer >60 >60 mL/min    Comment: (NOTE) The eGFR has been calculated using the CKD EPI equation. This calculation has not been validated in all clinical situations. eGFR's persistently <60 mL/min signify possible Chronic Kidney Disease.    Anion gap 8 5 - 15    Comment: Performed at [facility] [address], [place], [place] [address]  CBC with Differential/Platelet     Status: None   Collection Time: 06/25/18  4:17 AM  Result Value Ref Range   WBC 5.8 4.0 - 10.5 K/uL   RBC 4.86 4.22 - 5.81 MIL/uL   Hemoglobin 13.4 13.0 - 17.0 g/dL   HCT 41.5 39.0 - 52.0 %   MCV 85.4 78.0 - 100.0 fL   MCH 27.6 26.0 - 34.0 pg   MCHC 32.3 30.0 - 36.0 g/dL   RDW 13.6 11.5 - 15.5 %   Platelets 162 150 - 400 K/uL   Neutrophils Relative % 42 %   Neutro Abs 2.4 1.7 - 7.7 K/uL   Lymphocytes Relative 41 %   Lymphs Abs 2.4 0.7 - 4.0 K/uL   Monocytes Relative 10 %   Monocytes Absolute 0.6 0.1 - 1.0 K/uL   Eosinophils Relative 4 %   Eosinophils Absolute 0.2 0.0 - 0.7 K/uL   Basophils Relative 1 %   Basophils Absolute 0.1 0.0 - 0.1 K/uL   Immature Granulocytes 2 %   Abs Immature Granulocytes 0.1 0.0 - 0.1 K/uL    Comment: Performed at [facility] [address], [place], [place] [address]  Basic metabolic panel     Status: Abnormal   Collection Time: 06/25/18  4:17 AM  Result Value Ref Range   Sodium 139 135 - 145 mmol/L   Potassium 4.3 3.5 - 5.1 mmol/L   Chloride 106 98 - 111 mmol/L   CO2 28 22 - 32 mmol/L   Glucose, Bld 105 (H) 70 - 99 mg/dL   BUN 7 6 - 20 mg/dL   Creatinine, Ser 1.03 0.61 - 1.24 mg/dL   Calcium 8.9 8.9 - 10.3 mg/dL   GFR calc non Af Amer >60 >60 mL/min   GFR calc Af Amer >60 >60 mL/min    Comment: (NOTE) The eGFR has been calculated using the CKD EPI equation. This calculation has not been validated in all clinical situations. eGFR's persistently <60 mL/min signify possible Chronic Kidney Disease.    Anion gap 5 5 - 15    Comment: Performed at  [facility] [address], [place],  [address]  Vancomycin, trough     Status: None   Collection Time: 06/25/18  4:17 AM  Result Value Ref Range   Vancomycin Tr 18 15 - 20 ug/mL    Comment: Performed at [facility], [place] [address], [place], [place] [address]   Medical history and chart was reviewed  Assessment/Plan: 47 year old male with history of MRSA, multiple sclerosis and Crohn's disease with left elbow cellulitis/abscess  Clinically I do not feel that a further incision is drainage is warranted.  The patient is clinically improving.  He is afebrile with a normalized white blood cell count.  There is not an area of specific fluctuance but there is an area of induration.  I feel that continuation of IV antibiotics tonight with plan to transition to oral antibiotics tomorrow would be appropriate.  If he has any worsening overnight we can attempt a advanced imaging study like an ultrasound or an MRI if he feels that clinically he is not improving.  Recommendations: 1.  No role for incision and drainage currently 2.  Continue IV antibiotics this evening and transition to oral antibiotics tomorrow 3.  Reevaluation in the morning with holding off on any advanced imaging.  Patient and his wife are in understanding of this and they are in agreement.   Shona Needles, MD [facility] [phone] (phone)

## 2018-06-25 NOTE — Progress Notes (Signed)
Progress Note    Jonathon Vazquez  WPY:[id number] DOB: 10-28-1970  DOA: 06/23/2018 PCP: Susy Frizzle, MD    Brief Narrative:   Chief complaint:  left elbow abscess with worsening redness and swelling  Medical records reviewed and are as summarized below:  Jonathon Vazquez is an 47 y.o. male with hx MRSA infection and Crohn's disease who presented to ED on 8/31 with worsening swelling and redness to left elbow abscess. Pt initially seen at PCP clinic on 8/28 and rx'd Keflex for suspected left olecranon bursitis. Returned on 8/30 with worsening swelling/erythema with fever 101 at which time PCP preformed I&D. Pt was then put on Bactrim DS with wound care instructions. Pt presented to ED on 8/31 with worsening symptoms. He denies any limited ROM or joint pain.   Assessment/Plan:   Principal Problem:   Cellulitis Active Problems:   Multiple sclerosis (HCC)   Crohn's colitis- hx strictures and resection   Patent ductus arteriosus   MRSA (methicillin resistant Staphylococcus aureus)   Body mass index is 28.87 kg/m.   1. Left elbow abscess with cellulitis -s/p I&D on 8/30. Failed outpt tx with Keflex and Bactrim -IV Vancomycin D2. Wound culture +MRSA, Blood cx NGTD -xray shows diffuse SQ edema. Based on exam does not appear to involve the joint. -Improved swelling, but still with quite a bit of fluctuance around wound on elbow. Will ask ortho to eval to possible I&D  2. MS -stable. On home Tysabri  3. Hx Crohn's s/p left hemicolectomy -stable    Family Communication/Anticipated D/C date and plan/Code Status   DVT prophylaxis: Lovenox ordered. Code Status: Full Code.  Family Communication: pt will communicate with wife Disposition Plan: home when ready   Medical Consultants:    Orthopedics ([facility])   Anti-Infectives:    Vancomycin started 06/23/18  Bactrim and Keflex prior to admission  Subjective:   Feels that swelling to forearm is improved this am. Area  on elbow initially appeared after scraping against a bush last week. Denies decreased ROM or any joint pain.   Objective:    Vitals:   06/24/18 0658 06/24/18 0811 06/25/18 0518 06/25/18 0910  BP: (!) 106/53 110/73 107/71 122/70  Pulse: (!) 57 62 67 75  Resp: 16  16   Temp: 97.9 F (36.6 C) 98.5 F (36.9 C) 97.9 F (36.6 C) 98.5 F (36.9 C)  TempSrc: Oral Oral Oral Oral  SpO2: 99% 99% 97% 99%  Weight:      Height:        Intake/Output Summary (Last 24 hours) at 06/25/2018 1250 Last data filed at 06/25/2018 0549 Gross per 24 hour  Intake 200 ml  Output -  Net 200 ml   Filed Weights   06/23/18 2051 06/23/18 2100  Weight: 93.9 kg 93.9 kg    Exam: General: awake, alert sitting up in bed in NAD CV: S1S2 RRR, no m/r/g, no LE edema Resp: CTAB, no w/r/c MSK: swelling left midforearm up to left bicep region. Erythema in upper arm and warm to touch. Open wound to left elbow with surrounding fluctuance. No drainage on my exam, but did not attempt to express fluid.    Data Reviewed:   I have personally reviewed following labs and imaging studies:  Labs: Labs show the following:   Basic Metabolic Panel: Recent Labs  Lab 06/23/18 2107 06/24/18 0219 06/25/18 0417  NA 136 135 139  K 3.7 3.6 4.3  CL 101 104 106  CO2 25 23  28  GLUCOSE 138* 124* 105*  BUN 12 11 7   CREATININE 1.25* 1.16  1.19 1.03  CALCIUM 9.0 8.4* 8.9   GFR Estimated Creatinine Clearance: 102.6 mL/min (by C-G formula based on SCr of 1.03 mg/dL). Liver Function Tests: Recent Labs  Lab 06/23/18 2107  AST 23  ALT 33  ALKPHOS 88  BILITOT 1.1  PROT 7.1  ALBUMIN 3.7   No results for input(s): LIPASE, AMYLASE in the last 168 hours. No results for input(s): AMMONIA in the last 168 hours. Coagulation profile No results for input(s): INR, PROTIME in the last 168 hours.  CBC: Recent Labs  Lab 06/23/18 2107 06/24/18 0219 06/25/18 0417  WBC 10.0 7.4 5.8  NEUTROABS 6.3 3.9 2.4  HGB 12.8* 12.3* 13.4    HCT 40.0 37.8* 41.5  MCV 86.2 85.9 85.4  PLT 170 151 162   Cardiac Enzymes: No results for input(s): CKTOTAL, CKMB, CKMBINDEX, TROPONINI in the last 168 hours. BNP (last 3 results) No results for input(s): PROBNP in the last 8760 hours. CBG: No results for input(s): GLUCAP in the last 168 hours. D-Dimer: No results for input(s): DDIMER in the last 72 hours. Hgb A1c: No results for input(s): HGBA1C in the last 72 hours. Lipid Profile: No results for input(s): CHOL, HDL, LDLCALC, TRIG, CHOLHDL, LDLDIRECT in the last 72 hours. Thyroid function studies: No results for input(s): TSH, T4TOTAL, T3FREE, THYROIDAB in the last 72 hours.  Invalid input(s): FREET3 Anemia work up: No results for input(s): VITAMINB12, FOLATE, FERRITIN, TIBC, IRON, RETICCTPCT in the last 72 hours. Sepsis Labs: Recent Labs  Lab 06/23/18 2107 06/23/18 2127 06/24/18 0219 06/25/18 0417  WBC 10.0  --  7.4 5.8  LATICACIDVEN  --  1.05  --   --     Microbiology Recent Results (from the past 240 hour(s))  WOUND CULTURE     Status: Abnormal   Collection Time: 06/22/18  4:44 PM  Result Value Ref Range Status   MICRO NUMBER: [id number]  Final   SPECIMEN QUALITY: ADEQUATE  Final   SOURCE: NOT GIVEN  Final   STATUS: FINAL  Final   GRAM STAIN:   Final    Many White blood cells seen No epithelial cells seen No organisms seen   ISOLATE 1: Staphylococcus aureus (A)  Final    Comment: Light growth of Staphylococcus aureus Methicillin resistant Staphylococcus aureus (MRSA) Negative for inducible clindamycin resistance.      Susceptibility   Staphylococcus aureus - AEROBIC CULT, GRAM STAIN POSITIVE 1    VANCOMYCIN 1 Sensitive     CIPROFLOXACIN >=8 Resistant     CLINDAMYCIN <=0.25 Sensitive     LEVOFLOXACIN 4 Resistant     ERYTHROMYCIN >=8 Resistant     GENTAMICIN <=0.5 Sensitive     OXACILLIN* NR Resistant      * Oxacillin-resistant staphylococci are resistant toall currently available beta-lactam  antimicrobialagents including penicillins, beta lactam/beta-lactamase inhibitor combinations, and cephems withstaphylococcal indications, including Cefazolin.    TETRACYCLINE <=1 Sensitive     TRIMETH/SULFA* <=10 Sensitive      * Oxacillin-resistant staphylococci are resistant toall currently available beta-lactam antimicrobialagents including penicillins, beta lactam/beta-lactamase inhibitor combinations, and cephems withstaphylococcal indications, including Cefazolin.Legend:S = Susceptible  I = IntermediateR = Resistant  NS = Not susceptible* = Not tested  NR = Not reported**NN = See antimicrobic comments  WOUND CULTURE     Status: None   Collection Time: 06/22/18  4:46 PM  Result Value Ref Range Status   MICRO NUMBER: [id number]  Final  SPECIMEN QUALITY: ADEQUATE  Final   SOURCE: NOT GIVEN  Final   STATUS: FINAL  Final   GRAM STAIN: Rare White blood cells seen No organisms seen  Final   RESULT: No Growth  Final  Blood culture (routine x 2)     Status: None (Preliminary result)   Collection Time: 06/23/18 11:50 PM  Result Value Ref Range Status   Specimen Description BLOOD RIGHT ANTECUBITAL  Final   Special Requests   Final    BOTTLES DRAWN AEROBIC AND ANAEROBIC Blood Culture adequate volume   Culture   Final    NO GROWTH 1 DAY Performed at [facility], [place] [address], [place], [place] [address]    Report Status PENDING  Incomplete  Blood culture (routine x 2)     Status: None (Preliminary result)   Collection Time: 06/23/18 11:50 PM  Result Value Ref Range Status   Specimen Description BLOOD RIGHT HAND  Final   Special Requests   Final    BOTTLES DRAWN AEROBIC AND ANAEROBIC Blood Culture adequate volume   Culture   Final    NO GROWTH 1 DAY Performed at [facility], [place] [address], [place], [place] [address]    Report Status PENDING  Incomplete    Procedures and diagnostic studies:  Dg Elbow Complete Left (3+view)  Result Date: 06/24/2018 CLINICAL DATA:   47 year old male with left elbow infection. EXAM: LEFT ELBOW - COMPLETE 3+ VIEW COMPARISON:  None. FINDINGS: There is no acute fracture or dislocation. The bones are well mineralized. No arthritic changes. No periosteal reaction or bone erosion. No joint effusion. However evaluation is limited as a true 90 degree lateral view is not provided. There is diffuse subcutaneous edema. IMPRESSION: 1. No acute osseous pathology. 2. Diffuse subcutaneous edema may represent cellulitis. Clinical correlation is recommended. Electronically Signed   By: Anner Crete M.D.   On: 06/24/2018 06:02    Medications:   . enoxaparin (LOVENOX) injection  40 mg Subcutaneous Daily   Continuous Infusions: . sodium chloride    . vancomycin       Alan Ripper, NP-C [facility] [place]  pgr [phone]    *Please refer to [email or web address], password [email or web address] to get updated schedule on who will round on this patient, as hospitalists switch teams weekly. If 7PM-7AM, please contact night-coverage at [email or web address], password [email or web address] for any overnight needs.  06/25/2018, 12:50 PM

## 2018-06-25 NOTE — Progress Notes (Signed)
Wound care completed. Packed with iodofoarm gauze with 2x2 gauze on top and secured with kerlix roll. Patient tolerated well with no complaints of pain. Will continue to monitor patients pain levels. Verdia Kuba, RN 06/25/2018 6:24 PM

## 2018-06-26 DIAGNOSIS — L03114 Cellulitis of left upper limb: Principal | ICD-10-CM

## 2018-06-26 LAB — CBC WITH DIFFERENTIAL/PLATELET
ABS IMMATURE GRANULOCYTES: 0.2 10*3/uL — AB (ref 0.0–0.1)
BASOS PCT: 2 %
Basophils Absolute: 0.1 10*3/uL (ref 0.0–0.1)
Eosinophils Absolute: 0.3 10*3/uL (ref 0.0–0.7)
Eosinophils Relative: 5 %
HCT: 42.1 % (ref 39.0–52.0)
Hemoglobin: 13.4 g/dL (ref 13.0–17.0)
IMMATURE GRANULOCYTES: 4 %
LYMPHS ABS: 2.4 10*3/uL (ref 0.7–4.0)
LYMPHS PCT: 44 %
MCH: 27.4 pg (ref 26.0–34.0)
MCHC: 31.8 g/dL (ref 30.0–36.0)
MCV: 86.1 fL (ref 78.0–100.0)
MONOS PCT: 8 %
Monocytes Absolute: 0.5 10*3/uL (ref 0.1–1.0)
NEUTROS ABS: 2 10*3/uL (ref 1.7–7.7)
NEUTROS PCT: 37 %
PLATELETS: 178 10*3/uL (ref 150–400)
RBC: 4.89 MIL/uL (ref 4.22–5.81)
RDW: 13.5 % (ref 11.5–15.5)
WBC: 5.5 10*3/uL (ref 4.0–10.5)

## 2018-06-26 LAB — BASIC METABOLIC PANEL
ANION GAP: 6 (ref 5–15)
BUN: 12 mg/dL (ref 6–20)
CALCIUM: 8.8 mg/dL — AB (ref 8.9–10.3)
CO2: 26 mmol/L (ref 22–32)
Chloride: 106 mmol/L (ref 98–111)
Creatinine, Ser: 0.99 mg/dL (ref 0.61–1.24)
GFR calc Af Amer: >60 mL/min (ref >60)
GFR calc non Af Amer: >60 mL/min (ref >60)
GLUCOSE: 116 mg/dL — AB (ref 70–99)
POTASSIUM: 4.1 mmol/L (ref 3.5–5.1)
Sodium: 138 mmol/L (ref 135–145)

## 2018-06-26 MED ORDER — SULFAMETHOXAZOLE-TRIMETHOPRIM 800-160 MG PO TABS: 1.0000 | ORAL_TABLET | Freq: Two times a day (BID) | ORAL | 0 refills | Status: AC

## 2018-06-26 NOTE — Discharge Summary (Signed)
Physician Discharge Summary  ARCHIMEDES HAROLD VFI:[id number] DOB: [birth year] DOA: 06/23/2018  PCP: Susy Frizzle, MD  Admit date: 06/23/2018 Discharge date: 06/26/2018  Admitted From: Home Disposition:  Home  Discharge Condition:Stable CODE STATUS:FULL Diet recommendation: Regular   Brief/Interim Summary: HPI: Jonathon Vazquez is a 47 y.o. male with hx of MRSA infection, Crohn's disease, who presented to ED for worsening L forearm and shoulder pain, fevers at home (up to 102). He had an L elbow abscess (initial presumed olecranon bursitis) that was drained and packed last week by his outpatient provider. Initially completed keflex course and then this past week was switched to bactrim. Since I&D the past week, noted minimal drainage from his L elbow site but has been increasing erythema, warmth and pain in his L forearm and up to his L shoulder. Some tenderness and pain over incision site but full range of motion intact and only minimal pain at elbow with movement per patient. Has been having chills and measured fevers up to 102 deg x 2 days. Of note, patient was first diagnosed with MRSA earlier this year after a puncture wound of his thigh from a nail. His thigh wound has since healed.   Hospital course:  Patient's hospital course remained stable.  He was a started on IV antibiotics with vancomycin  on admission.  Orthopedics was consulted and they evaluated  the patient and did not recommend I&D.  Patient's cellulitis/abscess significantly improved with IV antibiotics.  Wound culture grew MRSA susceptible to Bactrim.  He is hemodynamically stable today.  He is stable for discharge home today with oral Bactrim.  Following problems were addressed during his hospitalization:  1. Left elbow abscess with cellulitis -s/p I&D on 8/30. Failed outpt tx with Keflex and Bactrim -Given IV Vancomycin after admission.  Wound culture +MRSA, Blood cx NGTD -xray shows diffuse SQ edema. Based on exam does not  appear to involve the joint. -Improved swelling, but still with quite a bit of fluctuance around wound on elbow.  -Orthopedics evaluated the patient, did not recommend I&D and recommended to continue oral antibiotics and cleared for discharge -Continue Bactrim for 7 more days at home.  2. MS -stable. On home Tysabri  3. Hx Crohn's s/p left hemicolectomy -stable    Discharge Diagnoses:  Principal Problem:   Cellulitis Active Problems:   Multiple sclerosis ([place])   Crohn's colitis- hx strictures and resection   Patent ductus arteriosus   MRSA (methicillin resistant Staphylococcus aureus)    Discharge Instructions  Discharge Instructions    Diet - low sodium heart healthy   Complete by:  As directed    Discharge instructions   Complete by:  As directed    1) Follow up with your PCP in a week. 2)Take prescribed medications as instructed.   Increase activity slowly   Complete by:  As directed      Allergies as of 06/26/2018      Reactions   Lialda [mesalamine] Diarrhea   Severe diarrhea      Medication List    TAKE these medications   acetaminophen 325 MG tablet Commonly known as:  TYLENOL Take 650 mg by mouth every 6 (six) hours as needed for mild pain.   HYDROcodone-acetaminophen 5-325 MG tablet Commonly known as:  NORCO/VICODIN Take 1 tablet by mouth every 6 (six) hours as needed for moderate pain.   natalizumab 300 MG/15ML injection Commonly known as:  TYSABRI Inject into the vein. 1 hour infusion monthly   sulfamethoxazole-trimethoprim 800-160  MG tablet Commonly known as:  BACTRIM DS,SEPTRA DS Take 1 tablet by mouth 2 (two) times daily for 7 days.      Follow-up Information    Susy Frizzle, MD. Schedule an appointment as soon as possible for a visit in 1 week(s).   Specialty:  Family Medicine Contact information: [phone] [place] [address] [phone]          Allergies  Allergen Reactions  . Lialda [Mesalamine]  Diarrhea    Severe diarrhea    Consultations: Orthopedics  Procedures/Studies: Dg Elbow Complete Left (3+view)  Result Date: 06/24/2018 CLINICAL DATA:  47 year old male with left elbow infection. EXAM: LEFT ELBOW - COMPLETE 3+ VIEW COMPARISON:  None. FINDINGS: There is no acute fracture or dislocation. The bones are well mineralized. No arthritic changes. No periosteal reaction or bone erosion. No joint effusion. However evaluation is limited as a true 90 degree lateral view is not provided. There is diffuse subcutaneous edema. IMPRESSION: 1. No acute osseous pathology. 2. Diffuse subcutaneous edema may represent cellulitis. Clinical correlation is recommended. Electronically Signed   By: Anner Crete M.D.   On: 06/24/2018 06:02       Subjective: Patient seen and examined the pressure this morning.  Remains comfortable.  Hemodynamically stable for discharge today.  Discharge Exam: Vitals:   06/25/18 1524 06/26/18 0450  BP: 120/70 101/72  Pulse: 71 65  Resp:  16  Temp:  98.6 F (37 C)  SpO2: 97% 98%   Vitals:   06/25/18 0518 06/25/18 0910 06/25/18 1524 06/26/18 0450  BP: 107/71 122/70 120/70 101/72  Pulse: 67 75 71 65  Resp: 16 17  16   Temp: 97.9 F (36.6 C) 98.5 F (36.9 C)  98.6 F (37 C)  TempSrc: Oral Oral  Oral  SpO2: 97% 99% 97% 98%  Weight:      Height:        General: Pt is alert, awake, not in acute distress Cardiovascular: RRR, S1/S2 +, no rubs, no gallops Respiratory: CTA bilaterally, no wheezing, no rhonchi Abdominal: Soft, NT, ND, bowel sounds + Extremities: no edema, no cyanosis, healing abscess/cellultis on the left elbow    The results of significant diagnostics from this hospitalization (including imaging, microbiology, ancillary and laboratory) are listed below for reference.     Microbiology: Recent Results (from the past 240 hour(s))  WOUND CULTURE     Status: Abnormal   Collection Time: 06/22/18  4:44 PM  Result Value Ref Range Status    MICRO NUMBER: [id number]  Final   SPECIMEN QUALITY: ADEQUATE  Final   SOURCE: NOT GIVEN  Final   STATUS: FINAL  Final   GRAM STAIN:   Final    Many White blood cells seen No epithelial cells seen No organisms seen   ISOLATE 1: Staphylococcus aureus (A)  Final    Comment: Light growth of Staphylococcus aureus Methicillin resistant Staphylococcus aureus (MRSA) Negative for inducible clindamycin resistance.      Susceptibility   Staphylococcus aureus - AEROBIC CULT, GRAM STAIN POSITIVE 1    VANCOMYCIN 1 Sensitive     CIPROFLOXACIN >=8 Resistant     CLINDAMYCIN <=0.25 Sensitive     LEVOFLOXACIN 4 Resistant     ERYTHROMYCIN >=8 Resistant     GENTAMICIN <=0.5 Sensitive     OXACILLIN* NR Resistant      * Oxacillin-resistant staphylococci are resistant toall currently available beta-lactam antimicrobialagents including penicillins, beta lactam/beta-lactamase inhibitor combinations, and cephems withstaphylococcal indications, including Cefazolin.  TETRACYCLINE <=1 Sensitive     TRIMETH/SULFA* <=10 Sensitive      * Oxacillin-resistant staphylococci are resistant toall currently available beta-lactam antimicrobialagents including penicillins, beta lactam/beta-lactamase inhibitor combinations, and cephems withstaphylococcal indications, including Cefazolin.Legend:S = Susceptible  I = IntermediateR = Resistant  NS = Not susceptible* = Not tested  NR = Not reported**NN = See antimicrobic comments  WOUND CULTURE     Status: None   Collection Time: 06/22/18  4:46 PM  Result Value Ref Range Status   MICRO NUMBER: [id number]  Final   SPECIMEN QUALITY: ADEQUATE  Final   SOURCE: NOT GIVEN  Final   STATUS: FINAL  Final   GRAM STAIN: Rare White blood cells seen No organisms seen  Final   RESULT: No Growth  Final  Blood culture (routine x 2)     Status: None (Preliminary result)   Collection Time: 06/23/18 11:50 PM  Result Value Ref Range Status   Specimen Description BLOOD RIGHT ANTECUBITAL  Final    Special Requests   Final    BOTTLES DRAWN AEROBIC AND ANAEROBIC Blood Culture adequate volume   Culture   Final    NO GROWTH 1 DAY Performed at [facility], [address] [place], [place] [address]    Report Status PENDING  Incomplete  Blood culture (routine x 2)     Status: None (Preliminary result)   Collection Time: 06/23/18 11:50 PM  Result Value Ref Range Status   Specimen Description BLOOD RIGHT HAND  Final   Special Requests   Final    BOTTLES DRAWN AEROBIC AND ANAEROBIC Blood Culture adequate volume   Culture   Final    NO GROWTH 1 DAY Performed at [facility], [place] [address], [place], [place] [address]    Report Status PENDING  Incomplete     Labs: BNP (last 3 results) No results for input(s): BNP in the last 8760 hours. Basic Metabolic Panel: Recent Labs  Lab 06/23/18 2107 06/24/18 0219 06/25/18 0417 06/26/18 0408  NA 136 135 139 138  K 3.7 3.6 4.3 4.1  CL 101 104 106 106  CO2 25 23 28 26   GLUCOSE 138* 124* 105* 116*  BUN 12 11 7 12   CREATININE 1.25* 1.16  1.19 1.03 0.99  CALCIUM 9.0 8.4* 8.9 8.8*   Liver Function Tests: Recent Labs  Lab 06/23/18 2107  AST 23  ALT 33  ALKPHOS 88  BILITOT 1.1  PROT 7.1  ALBUMIN 3.7   No results for input(s): LIPASE, AMYLASE in the last 168 hours. No results for input(s): AMMONIA in the last 168 hours. CBC: Recent Labs  Lab 06/23/18 2107 06/24/18 0219 06/25/18 0417 06/26/18 0408  WBC 10.0 7.4 5.8 5.5  NEUTROABS 6.3 3.9 2.4 2.0  HGB 12.8* 12.3* 13.4 13.4  HCT 40.0 37.8* 41.5 42.1  MCV 86.2 85.9 85.4 86.1  PLT 170 151 162 178   Cardiac Enzymes: No results for input(s): CKTOTAL, CKMB, CKMBINDEX, TROPONINI in the last 168 hours. BNP: Invalid input(s): POCBNP CBG: No results for input(s): GLUCAP in the last 168 hours. D-Dimer No results for input(s): DDIMER in the last 72 hours. Hgb A1c No results for input(s): HGBA1C in the last 72 hours. Lipid Profile No results for input(s): CHOL,  HDL, LDLCALC, TRIG, CHOLHDL, LDLDIRECT in the last 72 hours. Thyroid function studies No results for input(s): TSH, T4TOTAL, T3FREE, THYROIDAB in the last 72 hours.  Invalid input(s): FREET3 Anemia work up No results for input(s): VITAMINB12, FOLATE, FERRITIN,  TIBC, IRON, RETICCTPCT in the last 72 hours. Urinalysis No results found for: COLORURINE, APPEARANCEUR, [place], [place], [place], [place], [place], [place], PROTEINUR, UROBILINOGEN, NITRITE, LEUKOCYTESUR Sepsis Labs Invalid input(s): PROCALCITONIN,  WBC,  LACTICIDVEN Microbiology Recent Results (from the past 240 hour(s))  WOUND CULTURE     Status: Abnormal   Collection Time: 06/22/18  4:44 PM  Result Value Ref Range Status   MICRO NUMBER: [id number]  Final   SPECIMEN QUALITY: ADEQUATE  Final   SOURCE: NOT GIVEN  Final   STATUS: FINAL  Final   GRAM STAIN:   Final    Many White blood cells seen No epithelial cells seen No organisms seen   ISOLATE 1: Staphylococcus aureus (A)  Final    Comment: Light growth of Staphylococcus aureus Methicillin resistant Staphylococcus aureus (MRSA) Negative for inducible clindamycin resistance.      Susceptibility   Staphylococcus aureus - AEROBIC CULT, GRAM STAIN POSITIVE 1    VANCOMYCIN 1 Sensitive     CIPROFLOXACIN >=8 Resistant     CLINDAMYCIN <=0.25 Sensitive     LEVOFLOXACIN 4 Resistant     ERYTHROMYCIN >=8 Resistant     GENTAMICIN <=0.5 Sensitive     OXACILLIN* NR Resistant      * Oxacillin-resistant staphylococci are resistant toall currently available beta-lactam antimicrobialagents including penicillins, beta lactam/beta-lactamase inhibitor combinations, and cephems withstaphylococcal indications, including Cefazolin.    TETRACYCLINE <=1 Sensitive     TRIMETH/SULFA* <=10 Sensitive      * Oxacillin-resistant staphylococci are resistant toall currently available beta-lactam antimicrobialagents including penicillins, beta lactam/beta-lactamase inhibitor combinations, and cephems  withstaphylococcal indications, including Cefazolin.Legend:S = Susceptible  I = IntermediateR = Resistant  NS = Not susceptible* = Not tested  NR = Not reported**NN = See antimicrobic comments  WOUND CULTURE     Status: None   Collection Time: 06/22/18  4:46 PM  Result Value Ref Range Status   MICRO NUMBER: [id number]  Final   SPECIMEN QUALITY: ADEQUATE  Final   SOURCE: NOT GIVEN  Final   STATUS: FINAL  Final   GRAM STAIN: Rare White blood cells seen No organisms seen  Final   RESULT: No Growth  Final  Blood culture (routine x 2)     Status: None (Preliminary result)   Collection Time: 06/23/18 11:50 PM  Result Value Ref Range Status   Specimen Description BLOOD RIGHT ANTECUBITAL  Final   Special Requests   Final    BOTTLES DRAWN AEROBIC AND ANAEROBIC Blood Culture adequate volume   Culture   Final    NO GROWTH 1 DAY Performed at [facility], [address] [place], [place] [address]    Report Status PENDING  Incomplete  Blood culture (routine x 2)     Status: None (Preliminary result)   Collection Time: 06/23/18 11:50 PM  Result Value Ref Range Status   Specimen Description BLOOD RIGHT HAND  Final   Special Requests   Final    BOTTLES DRAWN AEROBIC AND ANAEROBIC Blood Culture adequate volume   Culture   Final    NO GROWTH 1 DAY Performed at [facility], [place] [address] [place], [place] [address]    Report Status PENDING  Incomplete    Please note: You were cared for by a hospitalist during your hospital stay. Once you are discharged, your primary care physician will handle any further medical issues. Please note that NO REFILLS for any discharge medications will be authorized once you are discharged, as it is imperative that  you return to your primary care physician (or establish a relationship with a primary care physician if you do not have one) for your post hospital discharge needs so that they can reassess your need for medications and monitor your lab  values.    Time coordinating discharge: 40 minutes  SIGNED:   Shelly Coss, MD  [facility] 06/26/2018, 12:03 PM Pager [phone]  If 7PM-7AM, please contact night-coverage [email or web address] Password [email or web address]

## 2018-06-26 NOTE — Progress Notes (Signed)
Exam and elbow appears unchanged. Recommend transition to PO bactrim and possible discharge today. He may follow up with his PCP. No role for advanced imaging or surgery at this point.  Shona Needles, MD [facility] [phone] (phone)

## 2018-06-26 NOTE — Progress Notes (Signed)
Discharge instructions completed with pt.  Pt verbalized understanding of the information.  Pt denies chest pain, shortness of breath, dizziness, lightheadedness, and n/v.  Pt's IV discontinued.  Pt discharged home.

## 2018-06-28 ENCOUNTER — Encounter: Payer: Self-pay | Admitting: Family Medicine

## 2018-06-28 ENCOUNTER — Ambulatory Visit (INDEPENDENT_AMBULATORY_CARE_PROVIDER_SITE_OTHER): Payer: Medicare Other | Admitting: Family Medicine

## 2018-06-28 VITALS — BP 118/76 | HR 98 | Temp 97.8°F | Resp 14 | Ht 71.0 in | Wt 203.0 lb

## 2018-06-28 DIAGNOSIS — Z09 Encounter for follow-up examination after completed treatment for conditions other than malignant neoplasm: Secondary | ICD-10-CM

## 2018-06-28 MED ORDER — MUPIROCIN CALCIUM 2 % EX CREA: 1.0000 "application " | TOPICAL_CREAM | Freq: Two times a day (BID) | CUTANEOUS | 0 refills | Status: DC

## 2018-06-28 MED ORDER — CHLORHEXIDINE GLUCONATE 4 % EX LIQD: Freq: Every day | CUTANEOUS | 11 refills | Status: DC | PRN

## 2018-06-28 NOTE — Progress Notes (Signed)
Subjective:    Patient ID: Jonathon Vazquez, male    DOB: 08-30-1971, 47 y.o.   MRN: [id number]  HPI  06/22/18 Patient was seen earlier in the week and was started on Keflex for presumed olecranon bursitis.  He presents today stating that his elbow is much worse.  There is erythema tracking up his tricep, around his left elbow, and down his left forearm as diagram below there is significant swelling around the olecranon bursa and also down into the left forearm.  There is a visible 1 cm pustule on the surface of the skin directly over top of the elbow that is tense and painful to the touch.  He is also having fever to 101 last night.  Surprisingly, the patient has no pain with flexion and extension of his elbow.  He has no pain with supination or pronation.  Range of motion of the elbow elicits no pain.  The infection appears to be superficial to the elbow joint and spreading in the soft tissues up and down the posterior aspect of his left arm as diagrammed.  Patient has a past medical history of MRSA recently treated.  At that time, my plan was: It is unclear whether this is septic bursitis with extension into the soft tissues tracking up the posterior left arm into the triceps region and down into the left forearm or whether this is a superficial soft tissue infection spreading in the skin over the elbow but not involving the olecranon bursa.  First I anesthetized the skin distal to the pustule where there is no erythema.  The area was prepped and draped in sterile fashion.  Using an 18-gauge needle, I then aspirated the olecranon bursa and recovered approximately 5 cc of yellow clear serous fluid which I will send for cell count and differential along with culture.  Next I anesthetized the skin over the pustule.  I made a cruciate incision 1 cm x 1 cm directly into the surface of the pustule.  Copious pus poured from the wound.  I estimate 20 cc of opaque yellow pus was retrieved.  I then introduced a  hemostat to open the incision releasing even more pus that by palpation appeared to be coming from the soft tissue over the distal tricep.  I estimated I retrieved another 10 cc of pus from this.  The entire cavity was approximately 3 cm x 2 cm.  I then used Q-tips with hydrogen peroxide probed the wound cavity on 5 separate occasions to clean the cavity.  9 inches of quarter inch iodoform gauze was then packed into the wound with strict wound care instructions given to the patient.  He will begin Bactrim double strength tablets 1 p.o. twice daily immediately to cover MRSA.  I want him to take 2 doses tonight 1 as soon as he gets the tablet and 1 before bed.  I want to see him back Tuesday.  If over the weekend, the swelling pain and redness worsens, he is to go directly to the hospital.  If he has pain with range of motion in his elbow he is to go directly to the hospital.  Based on the olecranon bursa aspiration, I believe that there is not septic bursitis.  I believe that this is instead a soft tissue abscess with cellulitis tracking up and down the arm as diagrammed.  Patient is comfortable with the plan and will go immediately to the hospital if worsening  06/28/18 Erythema worsened and the patient  ultimately had to go to the hospital following day.  He was given IV vancomycin.  He took 2 to 3 days of IV vancomycin and the erythema and swelling in his arm gradually improved.  Orthopedics was consulted.  They did not feel that the infection was extending into the joint space.  No further incision and drainage was deemed to be necessary.  As the patient clinically improved, he was switched to oral Bactrim and was discharged on oral Bactrim to complete an additional 7 to 10 days.  He is here today for follow-up.  The erythema and swelling in his left arm have improved dramatically.  The erythema over his left elbow has improved dramatically.  There is still a 5 to 6 mm opening just superior to the olecranon  process that is very shallow and healing by secondary intention from his previous I&D site.  All the packing has been removed.  There is no fluctuance warmth tenderness or pain.  He has full and painless range of motion in the left elbow Past Medical History:  Diagnosis Date  . Anal fistula 1996  . Anal stenosis   . Atrial fibrillation ([place])    pt and wife deny this  . Colon stricture ([facility])    descending colon  . Crohn's disease ([place])   . Hx of colonic polyp 10/21/2016  . MRSA (methicillin resistant Staphylococcus aureus)   . Multiple sclerosis ([place])   . Neuromuscular disorder ([place])   . Patent ductus arteriosus   . Regional enteritis of large intestine ([place])   . S/P left hemicolectomy 2015   [facility]-done for stricture  . Stricture of descending colon due to Crohn's disease 02/13/2012   Past Surgical History:  Procedure Laterality Date  . COLON SURGERY  2015   descending colon stricture   . COLONOSCOPY     multiple   Current Outpatient Medications on File Prior to Visit  Medication Sig Dispense Refill  . acetaminophen (TYLENOL) 325 MG tablet Take 650 mg by mouth every 6 (six) hours as needed for mild pain.     Marland Kitchen HYDROcodone-acetaminophen (NORCO/VICODIN) 5-325 MG tablet Take 1 tablet by mouth every 6 (six) hours as needed for moderate pain. 10 tablet 0  . natalizumab (TYSABRI) 300 MG/15ML injection Inject into the vein. 1 hour infusion monthly    . sulfamethoxazole-trimethoprim (BACTRIM DS,SEPTRA DS) 800-160 MG tablet Take 1 tablet by mouth 2 (two) times daily for 7 days. 14 tablet 0   No current facility-administered medications on file prior to visit.    Allergies  Allergen Reactions  . Lialda [Mesalamine] Diarrhea    Severe diarrhea   Social History   Socioeconomic History  . Marital status: Married    Spouse name: [name]  . Number of children: 3  . Years of education: 31  . Highest education level: Not on file  Occupational History  . Occupation: disabled  Social  Needs  . Financial resource strain: Not on file  . Food insecurity:    Worry: Not on file    Inability: Not on file  . Transportation needs:    Medical: Not on file    Non-medical: Not on file  Tobacco Use  . Smoking status: Former Smoker    Packs/day: 1.00    Years: 15.00    Pack years: 15.00    Types: Cigarettes    Last attempt to quit: 10/24/2004    Years since quitting: 13.6  . Smokeless tobacco: Never Used  Substance and Sexual Activity  .  Alcohol use: Never    Frequency: Never  . Drug use: Never  . Sexual activity: Yes  Lifestyle  . Physical activity:    Days per week: Not on file    Minutes per session: Not on file  . Stress: Not on file  Relationships  . Social connections:    Talks on phone: Not on file    Gets together: Not on file    Attends religious service: Not on file    Active member of club or organization: Not on file    Attends meetings of clubs or organizations: Not on file    Relationship status: Not on file  . Intimate partner violence:    Fear of current or ex partner: Not on file    Emotionally abused: Not on file    Physically abused: Not on file    Forced sexual activity: Not on file  Other Topics Concern  . Not on file  Social History Narrative   Married, 1 son one daughter. 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 Multimedia programmer)   Disabled, previously worked at [employer], disability due to multiple sclerosis   Caffeine one daily   Left handed.   Education- one year of college.      Review of Systems  All other systems reviewed and are negative.      Objective:   Physical Exam  Constitutional: He appears well-developed and well-nourished. No distress.  Cardiovascular: Normal rate, regular rhythm and normal heart sounds.  Pulmonary/Chest: Effort normal and breath sounds normal.  Musculoskeletal:       Left elbow: He exhibits deformity. He exhibits normal range of motion, no swelling, no effusion and no laceration. No tenderness found. No radial head, no medial  epicondyle, no lateral epicondyle and no olecranon process tenderness noted.  Skin: He is not diaphoretic.  Vitals reviewed.         Assessment & Plan:  Hospital discharge follow-up.  Patient is improving dramatically.  He has been off IV antibiotics now for 24 hours and his infection seems to have almost completely resolved.  Complete 7 days of Bactrim.  I have recommended Hibiclens baths at least once a week to decrease the colony counts of MRSA on his skin.  I also gave the patient a prescription for doxycycline that he can carry with him and in the event that he does begin to have an infection in the future he has access to an antibiotic that he can begin immediately to cover MRSA.

## 2018-06-29 LAB — CULTURE, BLOOD (ROUTINE X 2)
CULTURE: NO GROWTH
Culture: NO GROWTH
SPECIAL REQUESTS: ADEQUATE
SPECIAL REQUESTS: ADEQUATE

## 2018-07-12 ENCOUNTER — Ambulatory Visit
Admission: RE | Admit: 2018-07-12 | Discharge: 2018-07-12 | Disposition: A | Discharge status: Home / Self Care | Payer: Medicare Other | Source: Ambulatory Visit | Attending: Neurology | Admitting: Neurology

## 2018-07-12 DIAGNOSIS — G35 Multiple sclerosis: Secondary | ICD-10-CM

## 2018-07-12 MED ORDER — GADOBENATE DIMEGLUMINE 529 MG/ML IV SOLN
19.0000 mL | Freq: Once | INTRAVENOUS | Status: AC | PRN
  Administered 2018-07-12: 19 mL via INTRAVENOUS

## 2018-07-13 ENCOUNTER — Telehealth: Payer: Self-pay | Admitting: Neurology

## 2018-07-13 NOTE — Telephone Encounter (Signed)
Please call patient, MRI of the brain showed multiple lesions involving brainstem, cerebellum, bilateral hemisphere consistent with multiple sclerosis, no contrast-enhancement, no change compared to previous scan in August 2018   IMPRESSION: This MRI of the brain with and without contrast shows the following: 1.    There are multiple T2/FLAIR hyperintense foci in the brainstem, cerebellum, left thalamus and hemispheres in a pattern and configuration consistent with chronic demyelinating plaque associated with multiple sclerosis.  None of the foci appears to be acute.  When compared to the MRI dated 06/23/2017, there are no new lesions. 2.     There is a normal enhancement pattern and there are no acute findings.

## 2018-07-13 NOTE — Telephone Encounter (Signed)
Spoke to patient and he is aware of his MRI results.

## 2018-07-24 ENCOUNTER — Telehealth: Payer: Self-pay | Admitting: Nurse Practitioner

## 2018-07-24 NOTE — Telephone Encounter (Signed)
Benzena with [facility] calling to discuss Tysabri therapy and why patient was hospitalized. Case# [id number].

## 2018-07-25 NOTE — Telephone Encounter (Signed)
I spoke to Jonathon Vazquez with [facility].  She had questions relating to pts previous hosptialization.  He was seen 06/23/18 thru 06/26/18 at [facility] for L elbow cellulitis.  Is on tysabri.

## 2018-08-06 NOTE — Progress Notes (Signed)
[facility]  PATIENT: Jonathon Vazquez DOB: 01-31-1971   REASON FOR VISIT: Follow-up for multiple sclerosis, relapsing remitting HISTORY FROM: Patient    HISTORY OF PRESENT ILLNESS:He was seen by his [facility] since 2000, initially presenting with facial numbness, MRI demonstrates lesions consistent with MS, however he has lost followups, was suggested Copaxone in 2004, but never followed through, he was reevaluated by Dr. Ron Parker in February of 2008, he began to have bladder incontinence, dizziness, blurry vision, unbalanced gait, repeat MRI at that time continued to demonstrate multiple foci of signal hyperintensity, involving brainstem, superior peduncle, deep white matters, there was pericallosal involvement, no enhancement, but there was positive DWIs lesion, he was started on Avenox since 06/13/2007, he continued to have flulike illness following injection,  He also reported a history of Crohn's disease, last treatment was in 2004 with [place], he no longer has significant GI symptoms, He was treated with immunosuppressive treatment mercaptopurine in late 1990s, for 3-4 months, still had diahrrea.  He had one to 2 flareups each year, presenting with increased dizziness, unbalanced gait, blurry vision, worsening memory trouble, often partial improvement with steroid package.  Most recent MRI cervical was in July 2011, remote demyelinating plaque at C2 and C4, no enhancing lesions.  Repeat MRI brain and cervical in Jan 2013 showed a defined hyperintensity in the spinal cord at C2 which likely represents remote age inactive demyelinating plaque. No enhancing lesions are noted. As compared with previous MRI dated 04/30/2010 the C4 spinal cord hyperintensity is no longer seen.  MRI scan of the brain showed multiple brainstem, cerebellar, corpus callosal and periventricular white matter hyperintensities consistent with multiple sclerosis. No enhancing lesions are noted. Presence  of T1 black holes andd corpus callosal atrophy indicate chronic disease. Overall no significant changes compared with MRI from 01/23/2007   He is doing very well, there was no new neurological symptoms, he is taking Avonex, mild flulike illness right out of the injection, improved by Tylenol, or aleve. He complains of frequent bilateral frontal headaches.  We have reviewed MRI of the brain,which has demonstratedmultiple periventricular and subcortical and infratentorial chronic demyelinating plaques. No acute plaques. Compared to prior MRI on 01/23/07, there are has been disease progression.  He continues to do very well, reported prior to MRI scan, he has been off of his Avonex for 4 months, due to insurance reasons, after discussed with [name] we decided to stay on current Avonex treatment, overall only slight change in his MRI scan there is no significant clinical worsening, we will repeat MRI of the brain and cervical spine with and without contrast in one year.  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 Carlean Purl, he has been treated with prednisone, mercaptopurine in 2011 168m daily for one year, ( which is a nucleoside metabolic inhibitor that results in cell-cycle arrest and death)and mesalamine in the past,  We decided not to proceed with [place] Keep him on Avonex he complains of 3 weeks history of left arm numbness, since January 2015, has much improved now,   UPDATE 07/28/2014:YY [facility],   He has more memory loss. He also complains of fatigue. No recurrent mass  symptoms, he denied gait difficulty, no  visual disorder  We have reviewed MRI of brain: scattered bilateral periventricular, subcortical, corpus callosum, cerebellar and brainstem white matter hyperintensities typical for demyelinating disease. No enhancing lesions are noted. The presence of T1 black holes and mild atrophy of corpus callosum indicates chronic disease. Compared with previous MRI scan dated 04/22/13 there appears to be slight disease activity progression   MRI scan of the cervical spine showing left lateral disc herniation at C5-6 with significant narrowing of the foramina. Spinal cord hyperintensities at C2, C4-5 and in the brainstem and cerebellum likely represent remote age demyelinating plaques. No enhancing lesions are noted.  UPDATE Jan 26th 2016:YY He complains of increased short term memory loss, no significant gait difficulty, no vision loss does complains of fatigue, especially with heat exposure We have reviewed most recent MRI of the brain in July 2016 with without contrast, in comparison to MRI of brain in 2008, and 2015, multiple rounded and ovoid, periventricular, subcortical, juxtacortical and infratentorial chronic demyelinating plaque, some T1 black holes, no abnormal enhancement, no change compared to previous scans  UPDATE Sep 24 2015:YY He came in with his wife, and children at today's clinical visit, I have personally reviewed MRI of the film in 2016, in comparison with 2008, there was mild progression, he does has slow worsening mild gait difficulty, after extensive discussion, we decided proceed with Tysarbri IV infusion, he has history of Crohn's disease, low titer JC virus antibody 0.27 in July 2016, he has used mercaptopurine in 2011 14m daily for one year, both patient and his wife understand the potential risk and benefit, less than 10/998 chance of PML  UPDATE Dec 07 2016: He has started [name] since December of 2016, on a monthly basis, overall has been doing very well, most  recent MRI was in December 2017, no significant change compared to previous MRI in 2016, He noticed visual trouble since early Feb 2018, he could no longer read clock that he used to read, difficulty focusing,  worse on left side. He denies confusion, no headaches, He also complains of new onset insomnia, difficulty falling sleep,  UPDATE 4/19/ 2018CM Mr. SKope 47year old Vazquez returns for follow-up history of relapsing remitting multiple sclerosis. He is currently on Tysabri, had his infusion yesterday. MRI of the brain 02/27/2018Abnormal MRI brain (with and without) demonstrating: 1. Round and ovoid, periventricular, subcortical, middle cerebellar peduncle, left cerebellar chronic demyelinating plaques.  2. No abnormal lesions are seen on post contrast views.   3. No change from MRI on 10/13/16.  JC antibody collected 12/07/2016. Indeterminate 0.26. He continues to have some visual symptoms, difficulty focusing. He has not had an eye exam in quite some time. Klonopin has worked well for his insomnia. He denies any falls, any new weakness sensory changes. He denies any speech or swallowing difficulty, any problems with bowel or bladder control. He continues to go to the gym every week. He returns for reevaluation UPDATE  06/13/2017. CM Jonathon Vazquez returns for follow-up with history of relapsing remitting multiple sclerosis. He is currently on Tysabri last dose August 10. Next a September 7th.  Last MRI of the brain 12/20/2016 without change from previous 10/13/2016. Last JC antibody indeterminant. He has been doing well with his Tysabri infusions. He does complain with some difficulty focusing at times, denies visual problems speech or swallowing difficulty.  He denies any weakness sensory changes difficulty with bowel or bladder control .Klonopin continues to work well for  his insomnia. He returns for reevaluation  UPDATE 4/15/2019CM Jonathon Vazquez, 47 year old Vazquez returns for  follow-up with history of relapsing remitting multiple sclerosis he is currently on December he last dose was February 02, 2018.  Last MRI of the brain with and without 8/31/ 2018This MRI of the brain with and without contrast shows the following: 1.    T2/FLAIR hyperintense foci in the cerebellum, cerebellar peduncles, brainstem, right cerebral peduncle, left thalamus and both hemispheres in a pattern and configuration consistent with chronic demyelinating plaque associated with multiple sclerosis. None of the foci appears to be acute. When compared to the MRI dated 12/20/2016, there is no definite interval change. 2.    There is a normal enhancement pattern.  Last JC antibody negative at 0 .20 He does complain with blurred vision and is due to see an ophthalmologist next month.  He denies any speech or swallowing problems no difficulty with bowel or bladder control.  He denies any sensory changes.  He denies any falls he continues to take clonazepam for insomnia as needed.  He returns for reevaluation UPDATE 10/15/2019CM Jonathon Vazquez, 47 year old Vazquez returns for follow-up with relapsing remitting multiple sclerosis.  He is currently on Tysabri next infusion is 24 October.  Last JCV antibody was negative at 0.19 on February 12, 2018.  When last seen he was complaining with  blurred vision, he saw ophthalmology he now needs prescription glasses.  He denies any speech or swallowing problems no falls no sensory changes. 07/12/18 This MRI of the brain with and without contrast shows the following: 1.    There are multiple T2/FLAIR hyperintense foci in the brainstem, cerebellum, left thalamus and hemispheres in a pattern and configuration consistent with chronic demyelinating plaque associated with multiple sclerosis.  None of the foci appears to be acute.  When compared to the MRI dated 06/23/2017, there are no new lesions. 2.     There is a normal enhancement pattern and there are no acute findings. He returns for  reevaluation REVIEW OF SYSTEMS: Full 14 system review of systems performed and notable only for those listed, all others are neg:  Constitutional: neg  Cardiovascular: neg Ear/Nose/Throat: neg  Skin: neg Eyes: Blurred vision Respiratory: neg Gastroitestinal: neg  Hematology/Lymphatic: neg  Endocrine: neg Musculoskeletal:neg Allergy/Immunology: neg Neurological: neg Psychiatric: neg Sleep : neg   ALLERGIES: Allergies  Allergen Reactions  . Lialda [Mesalamine] Diarrhea    Severe diarrhea    HOME MEDICATIONS: Outpatient Medications Prior to Visit  Medication Sig Dispense Refill  . acetaminophen (TYLENOL) 325 MG tablet Take 650 mg by mouth every 6 (six) hours as needed for mild pain.     . chlorhexidine (HIBICLENS) 4 % external liquid Apply topically daily as needed. 120 mL 11  . HYDROcodone-acetaminophen (NORCO/VICODIN) 5-325 MG tablet Take 1 tablet by mouth every 6 (six) hours as needed for moderate pain. 10 tablet 0  . mupirocin cream (BACTROBAN) 2 % Apply 1 application topically 2 (two) times daily. 15 g 0  . natalizumab (TYSABRI) 300 MG/15ML injection Inject into the vein. 1 hour infusion monthly     No facility-administered medications prior to visit.     PAST MEDICAL HISTORY: Past Medical History:  Diagnosis Date  . Anal fistula 1996  . Anal stenosis   . Atrial fibrillation ([place])    pt and wife deny this  . Colon stricture ([facility])    descending colon  . Crohn's disease ([place])   . Hx of colonic polyp 10/21/2016  . MRSA (  methicillin resistant Staphylococcus aureus)   . Multiple sclerosis ([place])   . Neuromuscular disorder ([place])   . Patent ductus arteriosus   . Regional enteritis of large intestine ([place])   . S/P left hemicolectomy 2015   [facility]-done for stricture  . 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 COLON SURGERY  2015   descending colon stricture   .  COLONOSCOPY     multiple    FAMILY HISTORY: Family History  Problem Relation Age of Onset  . Ulcerative colitis Father   . Heart disease Mother   . Prostate cancer Paternal Grandfather   . Diabetes Paternal Grandmother   . Colon cancer Neg Hx   . Rectal cancer Neg Hx   . Stomach cancer Neg Hx     SOCIAL HISTORY: Social History   Socioeconomic History  . Marital status: Married    Spouse name: [name]  . Number of children: 3  . Years of education: 11  . Highest education level: Not on file  Occupational History  . Occupation: disabled  Social Needs  . Financial resource strain: Not on file  . Food insecurity:    Worry: Not on file    Inability: Not on file  . Transportation needs:    Medical: Not on file    Non-medical: Not on file  Tobacco Use  . Smoking status: Former Smoker    Packs/day: 1.00    Years: 15.00    Pack years: 15.00    Types: Cigarettes    Last attempt to quit: 10/24/2004    Years since quitting: 13.7  . Smokeless tobacco: Never Used  Substance and Sexual Activity  . Alcohol use: Never    Frequency: Never  . Drug use: Never  . Sexual activity: Yes  Lifestyle  . Physical activity:    Days per week: Not on file    Minutes per session: Not on file  . Stress: Not on file  Relationships  . Social connections:    Talks on phone: Not on file    Gets together: Not on file    Attends religious service: Not on file    Active member of club or organization: Not on file    Attends meetings of clubs or organizations: Not on file    Relationship status: Not on file  . Intimate partner violence:    Fear of current or ex partner: Not on file    Emotionally abused: Not on file    Physically abused: Not on file    Forced sexual activity: Not on file  Other Topics Concern  . Not on file  Social History Narrative   Married, 1 son one daughter. 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 Multimedia programmer)   Disabled, previously worked at [employer], disability due to multiple sclerosis    Caffeine one daily   Left handed.   Education- one year of college.     PHYSICAL EXAM  Vitals:   08/07/18 1522  BP: 114/64  Pulse: Jonathon  Weight: 208 lb 6.4 oz (94.5 kg)  Height: 5' 11"  (1.803 m)   Body mass index is 29.07 kg/m.  Generalized: Well developed, in no acute distress  Head: normocephalic and atraumatic,. Oropharynx benign  Neck: Supple,  Musculoskeletal: No deformity  Skin no rash or edema Neurological examination   Mentation: Alert oriented to time, place, history taking. Attention span and concentration appropriate. Recent and remote memory intact.  Follows  all commands speech and language fluent.   Cranial nerve II-XII: Visual acuity 20/70 right and 20/50 left Fundoscopic exam reveals sharp disc margins.Pupils were equal round reactive to light extraocular movements were full, visual field were full on confrontational test. Facial sensation and strength were normal. hearing was intact to finger rubbing bilaterally. Uvula tongue midline. head turning and shoulder shrug were normal and symmetric.Tongue protrusion into cheek strength was normal. Motor: normal bulk and tone, full strength in the BUE, BLE, fine finger movements normal, no pronator drift. No focal weakness Sensory: normal and symmetric to light touch, pinprick, and  Vibration, in the upper and lower extremities  Coordination: finger-nose-finger, heel-to-shin bilaterally, no dysmetria Reflexes: Symmetric upper and lower plantar responses were flexor bilaterally. Gait and Station: Rising up from seated position without assistance, normal stance,  moderate stride, good arm swing, smooth turning, able to perform tiptoe, and heel walking without difficulty. Tandem gait mildly unsteady .  No assistive device DIAGNOSTIC DATA (LABS, IMAGING, TESTING) - I reviewed patient records, labs, notes, testing and imaging myself where available.  Lab Results  Component Value Date   WBC 5.5 06/26/2018   HGB 13.4 06/26/2018     HCT 42.1 06/26/2018   MCV 86.1 06/26/2018   PLT 178 06/26/2018      Component Value Date/Time   NA 138 06/26/2018 0408   NA 138 06/13/2017 1637   K 4.1 06/26/2018 0408   CL 106 06/26/2018 0408   CO2 26 06/26/2018 0408   GLUCOSE 116 (H) 06/26/2018 0408   BUN 12 06/26/2018 0408   BUN 11 06/13/2017 1637   CREATININE 0.99 06/26/2018 0408   CREATININE 1.19 05/25/2018 0836   CALCIUM 8.8 (L) 06/26/2018 0408   PROT 7.1 06/23/2018 2107   PROT 7.1 06/13/2017 1637   ALBUMIN 3.7 06/23/2018 2107   ALBUMIN 4.4 06/13/2017 1637   AST 23 06/23/2018 2107   ALT 33 06/23/2018 2107   ALKPHOS 88 06/23/2018 2107   BILITOT 1.1 06/23/2018 2107   BILITOT 1.1 06/13/2017 1637   GFRNONAA >60 06/26/2018 0408   GFRNONAA 78 09/29/2016 1100   GFRAA >60 06/26/2018 0408   GFRAA >89 09/29/2016 1100   Lab Results  Component Value Date   CHOL 202 (H) 05/25/2018   HDL 56 05/25/2018   LDLCALC 130 (H) 05/25/2018   TRIG 70 05/25/2018   CHOLHDL 3.6 05/25/2018    Lab Results  Component Value Date   TSH 1.45 09/29/2016      ASSESSMENT AND PLAN  47 y.o. year old Vazquez  has a past medical history Relapsing remitting multiple sclerosis currently on. Tysabri monthly . He had a history of immunosuppressive treatment for his Crohn's disease mercaptopurine in 2011 138m daily for one year, ( which is a nucleoside metabolic inhibitor that results in cell-cycle arrest and death), negative JC virus antibody but still at the high risk for the potential PML, most recent MRI of the brain9/19/19  without change.  JC antibody collected 02/12/18  was negative at 0 .19    PLAN:Continue Tysabri monthly next infusion October 24 Obtain CBC , JCV labs  For blurred vision pt had appt with ophthalmology needs glasses Follow-up in 6 months Reviewed most recent MRI of the brain done in September with patient I spent 25 min  in total face to face time with the patient more than 50% of which was spent counseling and coordination  of care, reviewing test results reviewing medications and discussing and reviewing the diagnosis of MS  and  further treatment options. , Rayburn Ma, [facility], APRN  [facility] [address] [place],  [address] [phone]

## 2018-08-07 ENCOUNTER — Ambulatory Visit (INDEPENDENT_AMBULATORY_CARE_PROVIDER_SITE_OTHER): Payer: Medicare Other | Admitting: Nurse Practitioner

## 2018-08-07 ENCOUNTER — Encounter: Payer: Self-pay | Admitting: Nurse Practitioner

## 2018-08-07 VITALS — BP 114/64 | HR 87 | Ht 71.0 in | Wt 208.4 lb

## 2018-08-07 DIAGNOSIS — Z5181 Encounter for therapeutic drug level monitoring: Secondary | ICD-10-CM | POA: Diagnosis not present

## 2018-08-07 DIAGNOSIS — G35 Multiple sclerosis: Secondary | ICD-10-CM | POA: Diagnosis not present

## 2018-08-07 NOTE — Patient Instructions (Signed)
Continue Tysabri monthly Obtain CBC , JCV labs  For blurred vision pt had appt with ophthalmology needs glasses Follow-up in 6 months

## 2018-08-08 ENCOUNTER — Telehealth: Payer: Self-pay

## 2018-08-08 LAB — CBC WITH DIFFERENTIAL/PLATELET
BASOS ABS: 0.1 10*3/uL (ref 0.0–0.2)
Basos: 1 %
EOS (ABSOLUTE): 0.2 10*3/uL (ref 0.0–0.4)
Eos: 3 %
Hematocrit: 41.7 % (ref 37.5–51.0)
Hemoglobin: 14.1 g/dL (ref 13.0–17.7)
IMMATURE GRANS (ABS): 0.1 10*3/uL (ref 0.0–0.1)
IMMATURE GRANULOCYTES: 1 %
LYMPHS: 40 %
Lymphocytes Absolute: 2.9 10*3/uL (ref 0.7–3.1)
MCH: 28.3 pg (ref 26.6–33.0)
MCHC: 33.8 g/dL (ref 31.5–35.7)
MCV: 84 fL (ref 79–97)
Monocytes Absolute: 0.5 10*3/uL (ref 0.1–0.9)
Monocytes: 7 %
NEUTROS PCT: 48 %
NRBC: 1 % — ABNORMAL HIGH (ref 0–0)
Neutrophils Absolute: 3.6 10*3/uL (ref 1.4–7.0)
PLATELETS: 196 10*3/uL (ref 150–450)
RBC: 4.99 x10E6/uL (ref 4.14–5.80)
RDW: 13.4 % (ref 12.3–15.4)
WBC: 7.3 10*3/uL (ref 3.4–10.8)

## 2018-08-08 NOTE — Telephone Encounter (Signed)
Spoke with the patient and he verbalized understanding his results. No questions or concerns at this time.

## 2018-08-08 NOTE — Telephone Encounter (Signed)
-----   Message from Dennie Bible, NP sent at 08/08/2018  8:32 AM EDT ----- Labs stable please call the patient

## 2018-08-14 NOTE — Progress Notes (Signed)
I have reviewed and agreed above plan. 

## 2018-08-16 NOTE — Telephone Encounter (Signed)
LMVM home (ok per DPR) that lab JCV negative with 0.22 index.  He is to call back if questions.

## 2018-09-04 ENCOUNTER — Ambulatory Visit (INDEPENDENT_AMBULATORY_CARE_PROVIDER_SITE_OTHER): Payer: Medicare Other

## 2018-09-04 DIAGNOSIS — Z23 Encounter for immunization: Secondary | ICD-10-CM | POA: Diagnosis not present

## 2018-09-04 NOTE — Progress Notes (Signed)
Patient came in to receive his annual flu vaccine. Fluarix was given in the right deltoid. He tolerated well. VIS was given.

## 2018-10-10 ENCOUNTER — Telehealth: Payer: Self-pay | Admitting: Neurology

## 2018-10-10 NOTE — Telephone Encounter (Signed)
pt has called for the intrafusion suite, call transferred °

## 2019-01-01 ENCOUNTER — Telehealth: Payer: Self-pay | Admitting: Neurology

## 2019-01-01 NOTE — Telephone Encounter (Signed)
Pt transferred to infusion suite

## 2019-01-04 ENCOUNTER — Telehealth: Payer: Self-pay | Admitting: Neurology

## 2019-01-04 NOTE — Telephone Encounter (Signed)
pt has called for the intrafusion suite, call transferred °

## 2019-01-17 ENCOUNTER — Telehealth: Payer: Self-pay | Admitting: Neurology

## 2019-01-17 NOTE — Telephone Encounter (Signed)
pt has called for the intrafusion suite, call transferred °

## 2019-01-29 ENCOUNTER — Telehealth: Payer: Self-pay | Admitting: *Deleted

## 2019-01-29 NOTE — Telephone Encounter (Signed)
Left message requesting a return call.  Per vo by Dr. Felecia Shelling, Jonathon Vazquez is not really associated with lung infection.  Also, Tysabri does not decrease the patient's lymphocytes.  Luckily, this medication will not place him at any greater risk for infection. She should just follow the CDC guidelines in place for everyone in order to keep herself and family safe.

## 2019-01-29 NOTE — Telephone Encounter (Signed)
I have spoken to his wife.  She is aware of the information below and verbalized understanding.

## 2019-01-29 NOTE — Telephone Encounter (Signed)
Pt wife called need letter for Covid19/ tysarbri. Need letter for her job. Please call [phone]

## 2019-02-11 ENCOUNTER — Ambulatory Visit (INDEPENDENT_AMBULATORY_CARE_PROVIDER_SITE_OTHER): Payer: Medicare Other | Admitting: Neurology

## 2019-02-11 ENCOUNTER — Other Ambulatory Visit: Payer: Self-pay

## 2019-02-11 ENCOUNTER — Encounter: Payer: Self-pay | Admitting: Neurology

## 2019-02-11 DIAGNOSIS — K5000 Crohn's disease of small intestine without complications: Secondary | ICD-10-CM

## 2019-02-11 DIAGNOSIS — G35 Multiple sclerosis: Secondary | ICD-10-CM

## 2019-02-11 NOTE — Progress Notes (Signed)
[facility]  PATIENT: Jonathon Vazquez DOB: 08-Feb-1971   REASON FOR VISIT: Follow-up for multiple sclerosis, relapsing remitting HISTORY FROM: Patient    HISTORY OF PRESENT ILLNESS:He was seen by his [facility] since 2000, initially presenting with facial numbness, MRI demonstrates lesions consistent with MS, however he has lost followups, was suggested Copaxone in 2004, but never followed through, he was reevaluated by Dr. Ron Vazquez in February of 2008, he began to have bladder incontinence, dizziness, blurry vision, unbalanced gait, repeat MRI at that time continued to demonstrate multiple foci of signal hyperintensity, involving brainstem, superior peduncle, deep white matters, there was pericallosal involvement, no enhancement, but there was positive DWIs lesion, he was started on Avenox since 06/13/2007, he continued to have flulike illness following injection,  He also reported a history of Crohn's disease, last treatment was in 2004 with [place], he no longer has significant GI symptoms, He was treated with immunosuppressive treatment mercaptopurine in late 1990s, for 3-4 months, still had diahrrea.  He had one to 2 flareups each year, presenting with increased dizziness, unbalanced gait, blurry vision, worsening memory trouble, often partial improvement with steroid package.  Most recent MRI cervical was in July 2011, remote demyelinating plaque at C2 and C4, no enhancing lesions.  Repeat MRI brain and cervical in Jan 2013 showed a defined hyperintensity in the spinal cord at C2 which likely represents remote age inactive demyelinating plaque. No enhancing lesions are noted. As compared with previous MRI dated 04/30/2010 the C4 spinal cord hyperintensity is no longer seen.  MRI scan of the brain showed multiple brainstem, cerebellar, corpus callosal and periventricular white matter hyperintensities consistent with multiple sclerosis. No enhancing lesions are noted. Presence  of T1 black holes andd corpus callosal atrophy indicate chronic disease. Overall no significant changes compared with MRI from 01/23/2007   He is doing very well, there was no new neurological symptoms, he is taking Avonex, mild flulike illness right out of the injection, improved by Jonathon Vazquez, or Jonathon Vazquez. He complains of frequent bilateral frontal headaches.  We have reviewed MRI of the brain,which has demonstratedmultiple periventricular and subcortical and infratentorial chronic demyelinating plaques. No acute plaques. Compared to prior MRI on 01/23/07, there are has been disease progression.  He continues to do very well, reported prior to MRI scan, he has been off of his Avonex for 4 months, due to insurance reasons, after discussed with Jonathon Vazquez we decided to stay on current Avonex treatment, overall only slight change in his MRI scan there is no significant clinical worsening, we will repeat MRI of the brain and cervical spine with and without contrast in one year.  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 Carlean Vazquez, he has been treated with prednisone, mercaptopurine in 2011 157m daily for one year, ( which is a nucleoside metabolic inhibitor that results in cell-cycle arrest and death)and mesalamine in the past,  We decided not to proceed with Jonathon Vazquez Keep him on Avonex he complains of 3 weeks history of left arm numbness, since January 2015, has much improved now,   UPDATE 07/28/2014:Jonathon Vazquez He had laparoscopic surgery in Sept 3rd 2015 at [facility] which did help him,   He has more memory loss. He also complains of fatigue. No recurrent mass  symptoms, he denied gait difficulty, no  visual disorder  We have reviewed MRI of brain: scattered bilateral periventricular, subcortical, corpus callosum, cerebellar and brainstem white matter hyperintensities typical for demyelinating disease. No enhancing lesions are noted. The presence of T1 black holes and mild atrophy of corpus callosum indicates chronic disease. Compared with previous MRI scan dated 04/22/13 there appears to be slight disease activity progression   MRI scan of the cervical spine showing left lateral disc herniation at C5-6 with significant narrowing of the foramina. Spinal cord hyperintensities at C2, C4-5 and in the brainstem and cerebellum likely represent remote age demyelinating plaques. No enhancing lesions are noted.  UPDATE Jan 26th 2016:Jonathon Vazquez He complains of increased short term memory loss, no significant gait difficulty, no vision loss does complains of fatigue, especially with heat exposure We have reviewed most recent MRI of the brain in July 2016 with without contrast, in comparison to MRI of brain in 2008, and 2015, multiple rounded and ovoid, periventricular, subcortical, juxtacortical and infratentorial chronic demyelinating plaque, some T1 black holes, no abnormal enhancement, no change compared to previous scans  UPDATE Sep 24 2015:Jonathon Vazquez He came in with his wife, and children at today's clinical visit, I have personally reviewed MRI of the film in 2016, in comparison with 2008, there was mild progression, he does has slow worsening mild gait difficulty, after extensive discussion, we decided proceed with Tysarbri IV infusion, he has history of Crohn's disease, low titer JC virus antibody 0.27 in July 2016, he has used mercaptopurine in 2011 157m daily for one year, both patient and his wife understand the potential risk and benefit, less than 10/998 chance of PML  UPDATE Dec 07 2016: He has started Jonathon Vazquez since December of 2016, on a monthly basis, overall has been doing very well, most  recent MRI was in December 2017, no significant change compared to previous MRI in 2016, He noticed visual trouble since early Feb 2018, he could no longer read clock that he used to read, difficulty focusing,  worse on left side. He denies confusion, no headaches, He also complains of new onset insomnia, difficulty falling sleep,  UPDATE 4/19/ 2018CM Jonathon Vazquez 48year old male returns for follow-up history of relapsing remitting multiple sclerosis. He is currently on Tysabri, had his infusion yesterday. MRI of the brain 02/27/2018Abnormal MRI brain (with and without) demonstrating: 1. Round and ovoid, periventricular, subcortical, middle cerebellar peduncle, left cerebellar chronic demyelinating plaques.  2. No abnormal lesions are seen on post contrast views.   3. No change from MRI on 10/13/16.  JC antibody collected 12/07/2016. Indeterminate 0.26. He continues to have some visual symptoms, difficulty focusing. He has not had an eye exam in quite some time. Klonopin has worked well for his insomnia. He denies any falls, any new weakness sensory changes. He denies any speech or swallowing difficulty, any problems with bowel or bladder control. He continues to go to the gym every week. He returns for reevaluation UPDATE  06/13/2017. CM Jonathon Vazquez  48year old male returns for follow-up with history of relapsing remitting multiple sclerosis. He is currently on Tysabri last dose August 10. Next a September 7th.  Last MRI of the brain 12/20/2016 without change from previous 10/13/2016. Last JC antibody indeterminant. He has been doing well with his Tysabri infusions. He does complain with some difficulty focusing at times, denies visual problems speech or swallowing difficulty.  He denies any weakness sensory changes difficulty with bowel or bladder control .Klonopin continues to work well for  his insomnia. He returns for reevaluation  UPDATE 4/15/2019CM Jonathon Vazquez, 48 year old male returns for  follow-up with history of relapsing remitting multiple sclerosis he is currently on December he last dose was February 02, 2018.  Last MRI of the brain with and without 8/31/ 2018This MRI of the brain with and without contrast shows the following: 1.    T2/FLAIR hyperintense foci in the cerebellum, cerebellar peduncles, brainstem, right cerebral peduncle, left thalamus and both hemispheres in a pattern and configuration consistent with chronic demyelinating plaque associated with multiple sclerosis. None of the foci appears to be acute. When compared to the MRI dated 12/20/2016, there is no definite interval change. 2.    There is a normal enhancement pattern.  Last JC antibody negative at 0 .20 He does complain with blurred vision and is due to see an ophthalmologist next month.  He denies any speech or swallowing problems no difficulty with bowel or bladder control.  He denies any sensory changes.  He denies any falls he continues to take clonazepam for insomnia as needed.  He returns for reevaluation UPDATE 10/15/2019CM Jonathon Vazquez, 48 year old male returns for follow-up with relapsing remitting multiple sclerosis.  He is currently on Tysabri next infusion is 24 October.  Last JCV antibody was negative at 0.19 on February 12, 2018.  When last seen he was complaining with  blurred vision, he saw ophthalmology he now needs prescription glasses.  He denies any speech or swallowing problems no falls no sensory changes. 07/12/18 This MRI of the brain with and without contrast shows the following: 1.    There are multiple T2/FLAIR hyperintense foci in the brainstem, cerebellum, left thalamus and hemispheres in a pattern and configuration consistent with chronic demyelinating plaque associated with multiple sclerosis.  None of the foci appears to be acute.  When compared to the MRI dated 06/23/2017, there are no new lesions. 2.     There is a normal enhancement pattern and there are no acute findings. He returns for  reevaluation   Virtual Visit via Video  I connected with Jonathon Vazquez on 02/11/19 at  by video and verified that I am speaking with the correct person using two identifiers.   I discussed the limitations, risks, security and privacy concerns of performing an evaluation and management service by video and the availability of in person appointments. I also discussed with the patient that there may be a patient responsible charge related to this service. The patient expressed understanding and agreed to proceed.   History of Present Illness: He is overall doing very well, he joined the video conference with his wife, has been getting monthly Tysabri treatment, no significant side effect,  His Crohn's disease is under good control since he started on Tysabri   Observations/Objective: I have reviewed problem lists, medications, allergies.  I personally reviewed MRI of the brain with without contrast in September 2019, multiple T2/flair hyperintensity foci in the brainstem, cerebellum, left thalamus, in a pattern and configuration consistent with chronic demyelinating plaque, no change compared to August 2018, no contrast-enhancement  Awake alert oriented to history taking and care of conversation, ambulate without significant difficulty  Assessment and Plan: Relapsing remitting multiple sclerosis  Continue current dose of Tysabri  Check JC virus titer  Repeat MRI of the brain with without contrast in September 2020  Follow Up Instructions:   6 months   I discussed the assessment and treatment plan with the patient. The patient was provided an opportunity to ask questions  and all were answered. The patient agreed with the plan and demonstrated an understanding of the instructions.   The patient was advised to call back or seek an in-person evaluation if the symptoms worsen or if the condition fails to improve as anticipated.  I provided 25 minutes of non-face-to-face time during this  encounter.   Marcial Pacas, MD

## 2019-02-26 ENCOUNTER — Other Ambulatory Visit (INDEPENDENT_AMBULATORY_CARE_PROVIDER_SITE_OTHER): Payer: Self-pay

## 2019-02-26 ENCOUNTER — Other Ambulatory Visit: Payer: Self-pay

## 2019-02-26 DIAGNOSIS — Z0289 Encounter for other administrative examinations: Secondary | ICD-10-CM

## 2019-02-26 DIAGNOSIS — K5000 Crohn's disease of small intestine without complications: Secondary | ICD-10-CM

## 2019-02-26 DIAGNOSIS — G35 Multiple sclerosis: Secondary | ICD-10-CM

## 2019-02-27 LAB — CBC WITH DIFFERENTIAL
Basophils Absolute: 0.1 10*3/uL (ref 0.0–0.2)
Basos: 1 %
EOS (ABSOLUTE): 0.3 10*3/uL (ref 0.0–0.4)
Eos: 5 %
Hematocrit: 42.2 % (ref 37.5–51.0)
Hemoglobin: 13.9 g/dL (ref 13.0–17.7)
Immature Grans (Abs): 0 10*3/uL (ref 0.0–0.1)
Immature Granulocytes: 1 %
Lymphocytes Absolute: 2.8 10*3/uL (ref 0.7–3.1)
Lymphs: 46 %
MCH: 27.3 pg (ref 26.6–33.0)
MCHC: 32.9 g/dL (ref 31.5–35.7)
MCV: 83 fL (ref 79–97)
Monocytes Absolute: 0.5 10*3/uL (ref 0.1–0.9)
Monocytes: 8 %
NRBC: 1 % — ABNORMAL HIGH (ref 0–0)
Neutrophils Absolute: 2.3 10*3/uL (ref 1.4–7.0)
Neutrophils: 39 %
RBC: 5.09 x10E6/uL (ref 4.14–5.80)
RDW: 13.5 % (ref 11.6–15.4)
WBC: 6 10*3/uL (ref 3.4–10.8)

## 2019-03-05 ENCOUNTER — Telehealth: Payer: Self-pay | Admitting: *Deleted

## 2019-03-05 NOTE — Telephone Encounter (Signed)
Labs collected 02/26/2019:  JCV 0.25 - Indeterminate, Inhibition Assay - negative

## 2019-06-25 ENCOUNTER — Other Ambulatory Visit: Payer: Self-pay | Admitting: *Deleted

## 2019-06-25 DIAGNOSIS — G35 Multiple sclerosis: Secondary | ICD-10-CM

## 2019-07-02 ENCOUNTER — Telehealth: Payer: Self-pay | Admitting: Neurology

## 2019-07-02 NOTE — Telephone Encounter (Signed)
[employer] Medicare auth: [employer] spoke to [place] Ref # [id number] order sent to GI. They will reach out to the patient to schedule.

## 2019-07-07 IMAGING — DX DG ELBOW COMPLETE 3+V*L*
4 series · 4 of 4 positions shown · non-contrast
Comparison: None.

CLINICAL DATA: 39-year-old male with left elbow infection.

EXAM:
LEFT ELBOW - COMPLETE 3+ VIEW

[elbow ap]
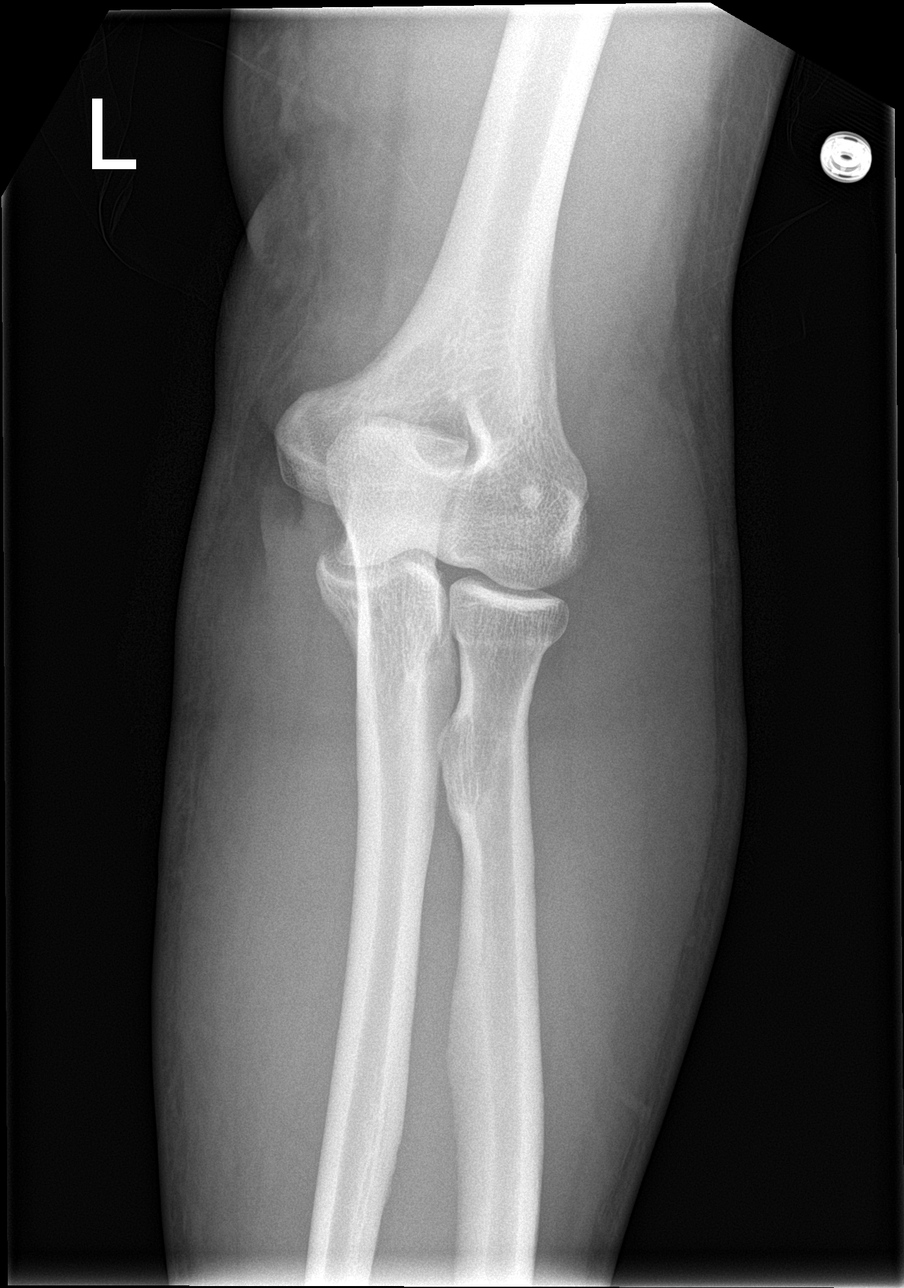

[elbow obl (1 of 2)]
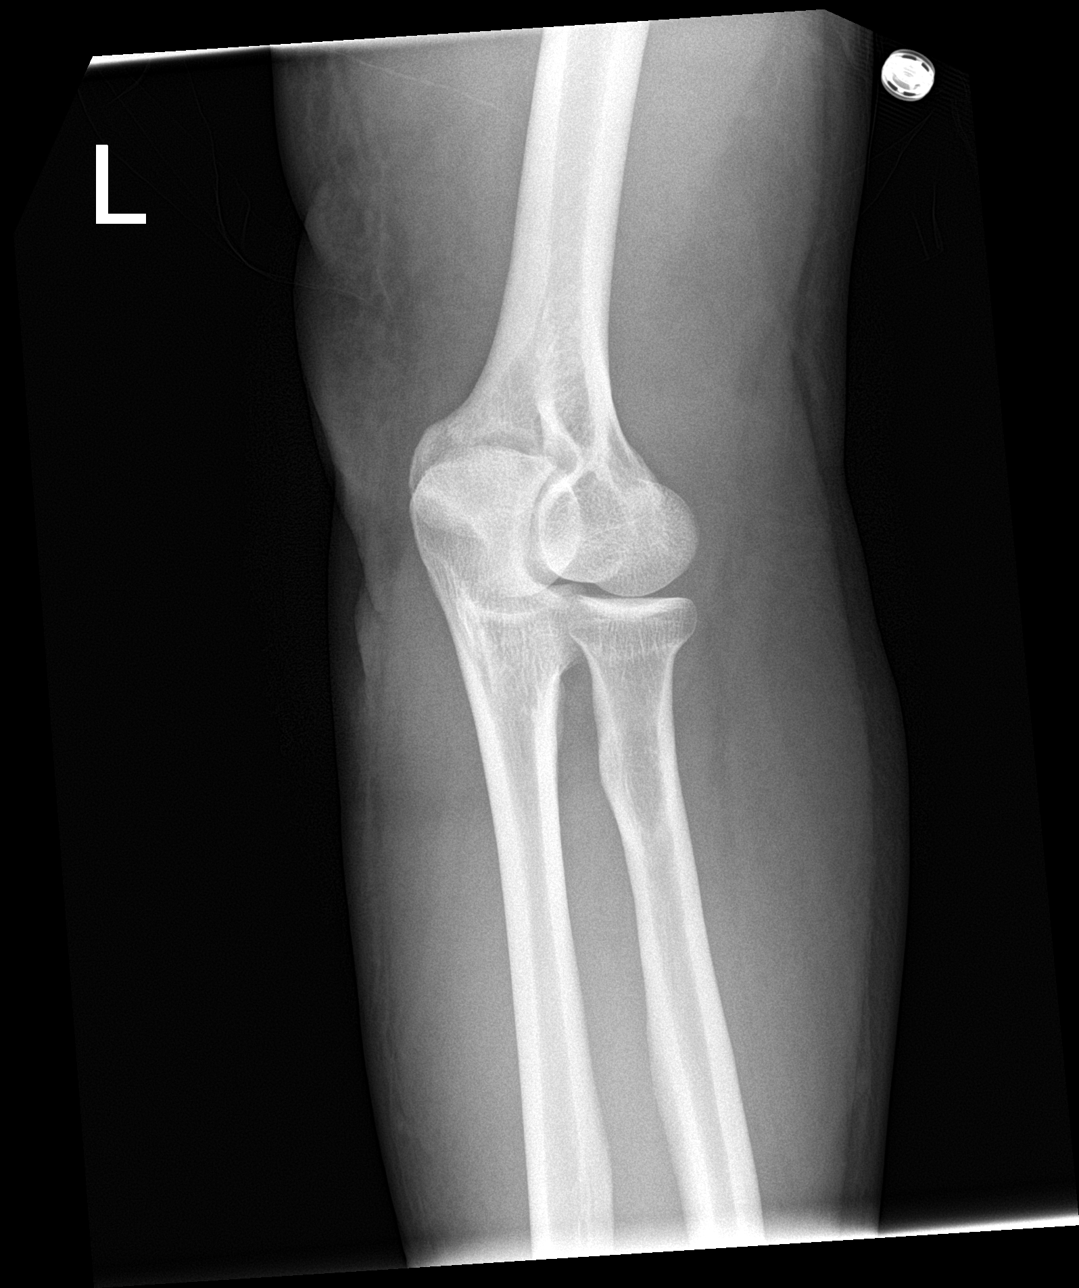

[elbow obl (2 of 2)]
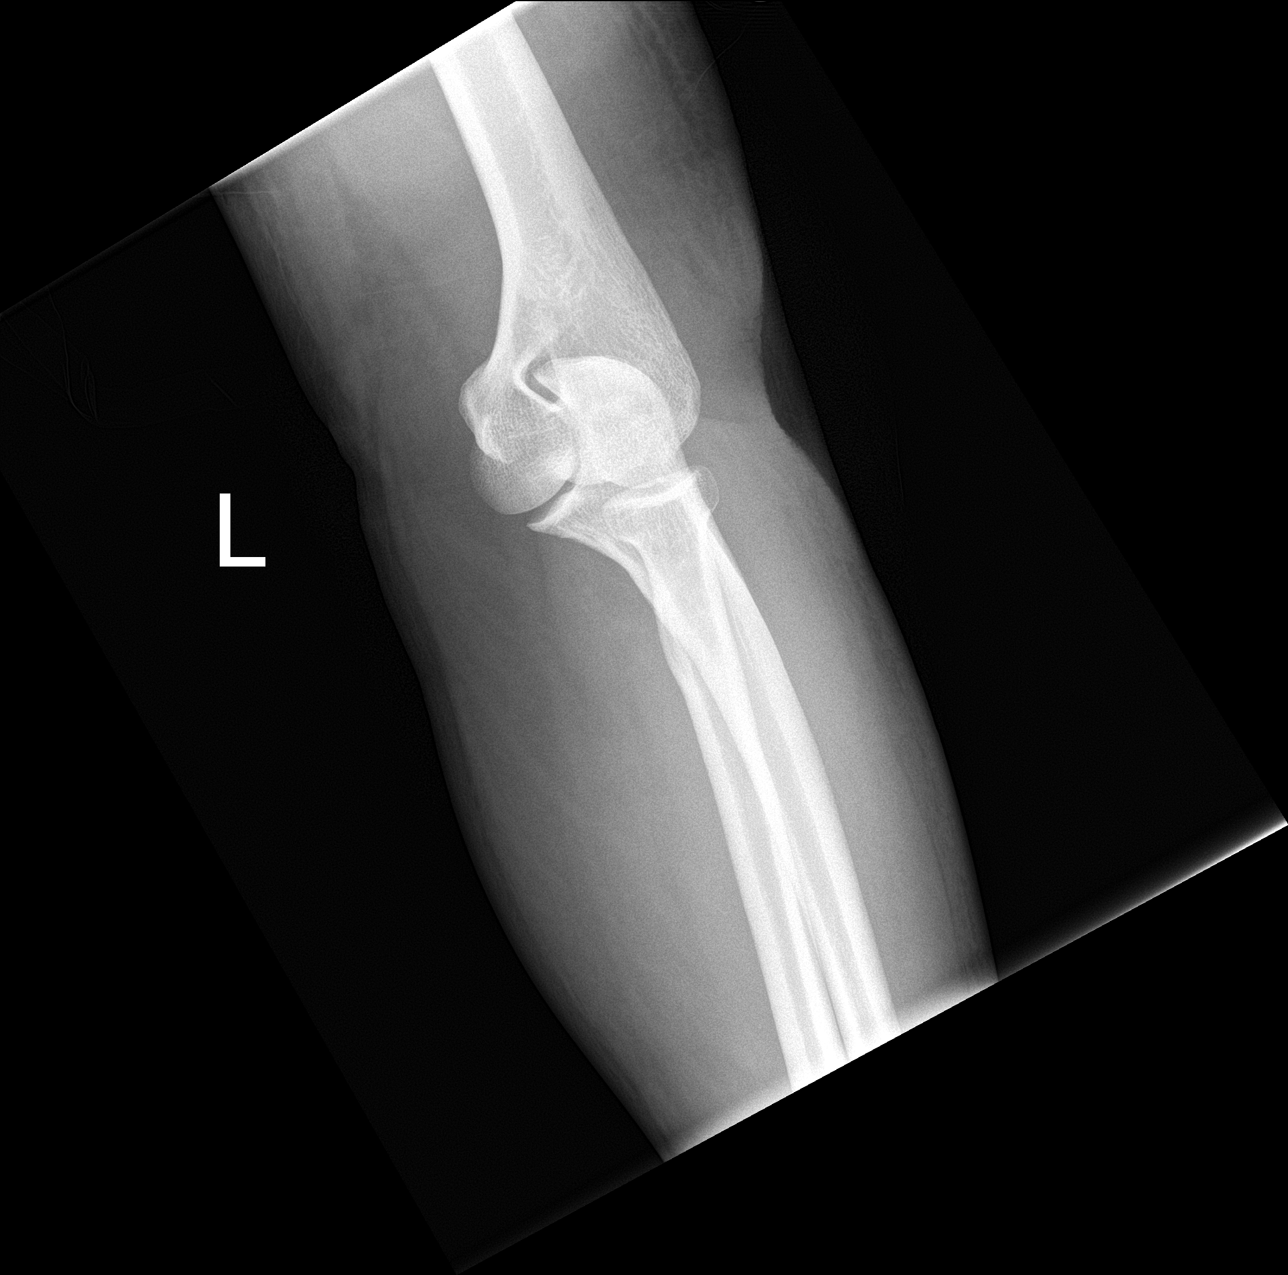

[elbow lat]
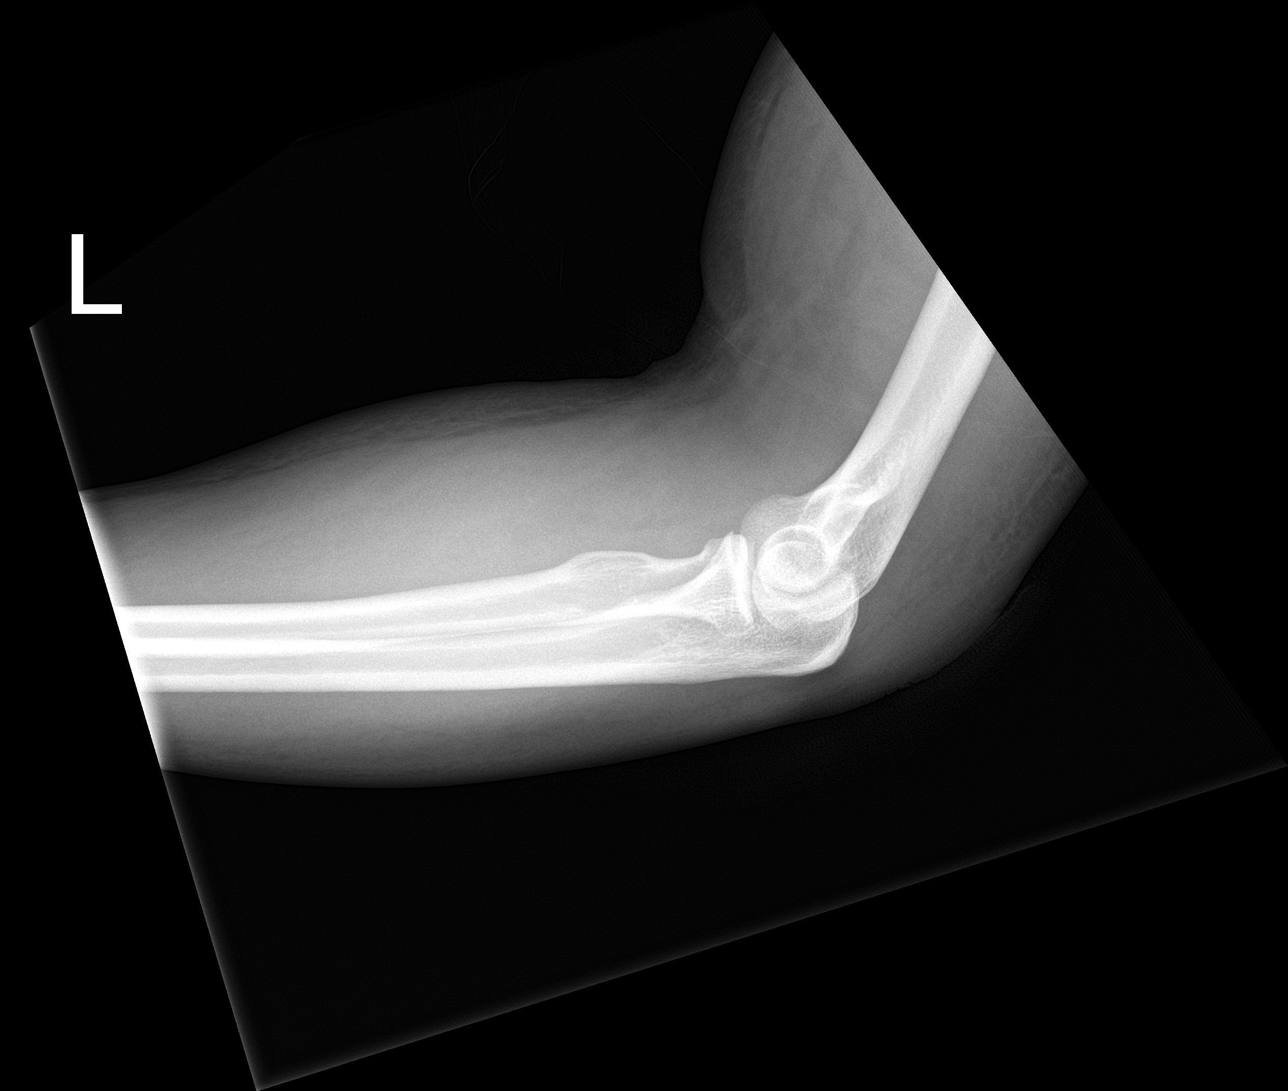

[4 of 4 positions shown; findings below may reference images not displayed]

FINDINGS: There is no acute fracture or dislocation. The bones are well
mineralized. No arthritic changes. No periosteal reaction or bone
erosion. No joint effusion. However evaluation is limited as a true
90 degree lateral view is not provided. There is diffuse
subcutaneous edema.
IMPRESSION: 1. No acute osseous pathology.
2. Diffuse subcutaneous edema may represent cellulitis. Clinical
correlation is recommended.

## 2019-07-24 ENCOUNTER — Ambulatory Visit
Admission: RE | Admit: 2019-07-24 | Discharge: 2019-07-24 | Disposition: A | Discharge status: Home / Self Care | Payer: Medicare Other | Source: Ambulatory Visit | Attending: Neurology | Admitting: Neurology

## 2019-07-24 ENCOUNTER — Other Ambulatory Visit: Payer: Self-pay

## 2019-07-24 DIAGNOSIS — G35 Multiple sclerosis: Secondary | ICD-10-CM

## 2019-07-24 MED ORDER — GADOBENATE DIMEGLUMINE 529 MG/ML IV SOLN
20.0000 mL | Freq: Once | INTRAVENOUS | Status: AC | PRN
  Administered 2019-07-24: 20 mL via INTRAVENOUS

## 2019-08-22 ENCOUNTER — Ambulatory Visit: Payer: Medicare Other | Admitting: Neurology

## 2019-08-22 ENCOUNTER — Encounter: Payer: Self-pay | Admitting: Neurology

## 2019-08-22 ENCOUNTER — Other Ambulatory Visit: Payer: Self-pay

## 2019-08-22 VITALS — BP 130/67 | HR 67 | Temp 97.9°F | Ht 71.0 in | Wt 206.5 lb

## 2019-08-22 DIAGNOSIS — Z79899 Other long term (current) drug therapy: Secondary | ICD-10-CM | POA: Diagnosis not present

## 2019-08-22 DIAGNOSIS — K5000 Crohn's disease of small intestine without complications: Secondary | ICD-10-CM | POA: Diagnosis not present

## 2019-08-22 DIAGNOSIS — G35 Multiple sclerosis: Secondary | ICD-10-CM

## 2019-08-22 MED ORDER — CLONAZEPAM 0.5 MG PO TABS: 0.5000 mg | ORAL_TABLET | Freq: Every evening | ORAL | 5 refills | Status: DC | PRN

## 2019-08-22 NOTE — Progress Notes (Signed)
PATIENT: Jonathon Vazquez DOB: 1970-11-11  Chief Complaint  Patient presents with   Multiple Sclerosis    He is here for his routine follow up.  Reports he feels good and is doing well on Tysabri.      HISTORICAL  HISTORY OF PRESENT ILLNESS:He was seen by his [facility] since 2000, initially presenting with facial numbness, MRI demonstrates lesions consistent with MS, however he has lost followups, was suggested Copaxone in 2004, but never followed through, he was reevaluated by Dr. Ron Parker in February of 2008, he began to have bladder incontinence, dizziness, blurry vision, unbalanced gait, repeat MRI at that time continued to demonstrate multiple foci of signal hyperintensity, involving brainstem, superior peduncle, deep white matters, there was pericallosal involvement, no enhancement, but there was positive DWIs lesion, he was started on Avenox since 06/13/2007, he continued to have flulike illness following injection,  He also reported a history of Crohn's disease, last treatment was in 2004 with [facility], he no longer has significant GI symptoms, He was treated with immunosuppressive treatment mercaptopurine in late 1990s, for 3-4 months, still had diahrrea.  He had one to 2 flareups each year, presenting with increased dizziness, unbalanced gait, blurry vision, worsening memory trouble, often partial improvement with steroid package.  Most recent MRI cervical was in July 2011, remote demyelinating plaque at C2 and C4, no enhancing lesions.  Repeat MRI brain and cervical in Jan 2013 showed a defined hyperintensity in the spinal cord at C2 which likely represents remote age inactive demyelinating plaque. No enhancing lesions are noted. As compared with previous MRI dated 04/30/2010 the C4 spinal cord hyperintensity is no longer seen.  MRI scan of the brain showed multiple brainstem, cerebellar, corpus callosal and periventricular white matter hyperintensities consistent with multiple  sclerosis. No enhancing lesions are noted. Presence of T1 black holes andd corpus callosal atrophy indicate chronic disease. Overall no significant changes compared with MRI from 01/23/2007   He is doing very well, there was no new neurological symptoms, he is taking Avonex, mild flulike illness right out of the injection, improved by Tylenol, or aleve. He complains of frequent bilateral frontal headaches.  We have reviewed MRI of the brain,which has demonstratedmultiple periventricular and subcortical and infratentorial chronic demyelinating plaques. No acute plaques. Compared to prior MRI on 01/23/07, there are has been disease progression.  He continues to do very well, reported prior to MRI scan, he has been off of his Avonex for 4 months, due to insurance reasons, after discussed with Jonathon Vazquez we decided to stay on current Avonex treatment, overall only slight change in his MRI scan there is no significant clinical worsening, we will repeat MRI of the brain and cervical spine with and without contrast in one year.  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 Carlean Purl, he has been treated with prednisone, mercaptopurine in 2011 126m daily for one year, ( which is a nucleoside metabolic inhibitor that results in cell-cycle arrest and death)and mesalamine in the past,  We decided not to proceed with [place] Keep him on Avonex he complains of 3 weeks history of left arm numbness, since January 2015, has much improved now,   UPDATE 07/28/2014:YY He had laparoscopic surgery in Sept 3rd 2015 at  [facility], which did help him,   He has more memory loss. He also complains of fatigue. No  recurrent mass symptoms, he denied gait difficulty, no visual disorder  We have reviewed MRI of brain: scattered bilateral periventricular, subcortical, corpus callosum, cerebellar and brainstem white matter hyperintensities typical for demyelinating disease. No enhancing lesions are noted. The presence of T1 black holes and mild atrophy of corpus callosum indicates chronic disease. Compared with previous MRI scan dated 04/22/13 there appears to be slight disease activity progression   MRI scan of the cervical spine showing left lateral disc herniation at C5-6 with significant narrowing of the foramina. Spinal cord hyperintensities at C2, C4-5 and in the brainstem and cerebellum likely represent remote age demyelinating plaques. No enhancing lesions are noted.  UPDATE Jan 26th 2016:YY He complains of increased short term memory loss, no significant gait difficulty, no vision loss does complains of fatigue, especially with heat exposure We have reviewed most recent MRI of the brain in July 2016 with without contrast, in comparison to MRI of brain in 2008, and 2015, multiple rounded and ovoid, periventricular, subcortical, juxtacortical and infratentorial chronic demyelinating plaque, some T1 black holes, no abnormal enhancement, no change compared to previous scans  UPDATE Sep 24 2015:YY He came in with his wife, and children at today's clinical visit, I have personally reviewed MRI of the film in 2016, in comparison with 2008, there was mild progression, he does has slow worsening mild gait difficulty, after extensive discussion, we decided proceed with Tysarbri IV infusion, he has history of Crohn's disease, low titer JC virus antibody 0.27 in July 2016, he has used mercaptopurine in 2011 146m daily for one year, both patient and his wife understand the potential risk and benefit, less than 10/998 chance of PML  UPDATE Dec 07 2016: He has started [name] since December of 2016, on a  monthly basis, overall has been doing very well, most recent MRI was in December 2017, no significant change compared to previous MRI in 2016, He noticed visual trouble since early Feb 2018, he could no longer read clock that he used to read, difficulty focusing,  worse on left side. He denies confusion, no headaches, He also complains of new onset insomnia, difficulty falling sleep,   Virtual Visit via Video February 11 2019:   He is overall doing very well, he joined the video conference with his wife, has been getting monthly Tysabri treatment, no significant side effect,  His Crohn's disease is under good control since he started on Tysabri  UPDATE Aug 22 2019: He is doing very well, no recurrent neurological spells, Crohn's disease is also under good control with [place]  I personally reviewed MRI of the brain with without contrast in October 2020: Multiple T2/flair hyperintensity foci in the brainstem, cerebellum, left thalamus, in a pattern and configuration consistent with chronic demyelinating plaque, no change compared to September 2019, no contrast-enhancement   REVIEW OF SYSTEMS: Full 14 system review of systems performed and notable only for as above All other review of systems were negative.  ALLERGIES: Allergies  Allergen Reactions   Lialda [Mesalamine] Diarrhea    Severe diarrhea    HOME MEDICATIONS: Current Outpatient Medications  Medication Sig Dispense Refill   acetaminophen (TYLENOL) 325 MG tablet Take 650 mg by mouth every 6 (six) hours as needed for mild pain.      chlorhexidine (HIBICLENS) 4 % external liquid Apply topically daily as needed. 120 mL 11   mupirocin cream (BACTROBAN) 2 %  Apply 1 application topically 2 (two) times daily. 15 g 0   natalizumab (TYSABRI) 300 MG/15ML injection Inject into the vein. 1 hour infusion monthly     No current facility-administered medications for this visit.     PAST MEDICAL HISTORY: Past Medical History:    Diagnosis Date   Anal fistula 1996   Anal stenosis    Atrial fibrillation (HCC)    pt and wife deny this   Colon stricture ([place])    descending colon   Crohn's disease ([place])    Hx of colonic polyp 10/21/2016   MRSA (methicillin resistant Staphylococcus aureus)    Multiple sclerosis (HCC)    Neuromuscular disorder ([place])    Patent ductus arteriosus    Regional enteritis of large intestine (HCC)    S/P left hemicolectomy 2015   [facility]-done for stricture   Stricture of descending colon due to Crohn's disease 02/13/2012    PAST SURGICAL HISTORY: Past Surgical History:  Procedure Laterality Date   COLON SURGERY  2015   descending colon stricture    COLONOSCOPY     multiple    FAMILY HISTORY: Family History  Problem Relation Age of Onset   Ulcerative colitis Father    Heart disease Mother    Prostate cancer Paternal Grandfather    Diabetes Paternal Grandmother    Colon cancer Neg Hx    Rectal cancer Neg Hx    Stomach cancer Neg Hx     SOCIAL HISTORY: Social History   Socioeconomic History   Marital status: Married    Spouse name: Jonathon Vazquez   Number of children: 3   Years of education: 13   Highest education level: Not on file  Occupational History   Occupation: disabled  Scientist, product/process development strain: Not on file   Food insecurity    Worry: Not on file    Inability: Not on Lexicographer needs    Medical: Not on file    Non-medical: Not on file  Tobacco Use   Smoking status: Former Smoker    Packs/day: 1.00    Years: 15.00    Pack years: 15.00    Types: Cigarettes    Quit date: 10/24/2004    Years since quitting: 14.8   Smokeless tobacco: Never Used  Substance and Sexual Activity   Alcohol use: Never    Frequency: Never   Drug use: Never   Sexual activity: Yes  Lifestyle   Physical activity    Days per week: Not on file    Minutes per session: Not on file   Stress: Not on file  Relationships    Social connections    Talks on phone: Not on file    Gets together: Not on file    Attends religious service: Not on file    Active member of club or organization: Not on file    Attends meetings of clubs or organizations: Not on file    Relationship status: Not on file   Intimate partner violence    Fear of current or ex partner: Not on file    Emotionally abused: Not on file    Physically abused: Not on file    Forced sexual activity: Not on file  Other Topics Concern   Not on file  Social History Narrative   Married, 1 son one daughter. 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 Multimedia programmer)   Disabled, previously worked at [employer], disability due to multiple sclerosis   Caffeine one daily   Left handed.  Education- one year of college.     PHYSICAL EXAM   Vitals:   08/22/19 1447  BP: 130/67  Pulse: 67  Temp: 97.9 F (36.6 C)  Weight: 206 lb 8 oz (93.7 kg)  Height: 5' 11"  (1.803 m)    Not recorded      Body mass index is 28.8 kg/m.  PHYSICAL EXAMNIATION:  Gen: NAD, conversant, well nourised, well groomed                     Cardiovascular: Regular rate rhythm, no peripheral edema, warm, nontender. Eyes: Conjunctivae clear without exudates or hemorrhage Neck: Supple, no carotid bruits. Pulmonary: Clear to auscultation bilaterally   NEUROLOGICAL EXAM:  MENTAL STATUS: Speech:    Speech is normal; fluent and spontaneous with normal comprehension.  Cognition:     Orientation to time, place and person     Normal recent and remote memory     Normal Attention span and concentration     Normal Language, naming, repeating,spontaneous speech     Fund of knowledge   CRANIAL NERVES: CN II: Visual fields are full to confrontation.  Pupils are round equal and briskly reactive to light. CN III, IV, VI: extraocular movement are normal. No ptosis. CN V: Facial sensation is intact to pinprick in all 3 divisions bilaterally. Corneal responses are intact.  CN VII: Face is symmetric with normal eye  closure and smile. CN VIII: Hearing is normal to causal conversation. CN IX, X: Palate elevates symmetrically. Phonation is normal. CN XI: Head turning and shoulder shrug are intact CN XII: Tongue is midline with normal movements and no atrophy.  MOTOR: There is no pronator drift of out-stretched arms. Muscle bulk and tone are normal. Muscle strength is normal.  REFLEXES: Reflexes are 2+ and symmetric at the biceps, triceps, knees, and ankles. Plantar responses are flexor.  SENSORY: Intact to light touch, pinprick, positional sensation and vibratory sensation are intact in fingers and toes.  COORDINATION: Rapid alternating movements and fine finger movements are intact. There is no dysmetria on finger-to-nose and heel-knee-shin.    GAIT/STANCE: Posture is normal. Gait is steady with normal steps, base, arm swing, and turning. Heel and toe walking are normal. Tandem gait is normal.  Romberg is absent.   DIAGNOSTIC DATA (LABS, IMAGING, TESTING) - I reviewed patient records, labs, notes, testing and imaging myself where available.   ASSESSMENT AND PLAN  JAHKING LESSER is a 48 y.o. male   Relapsing remitting multiple sclerosis Crohn's disease  He started Tysabri since December 2016  Laboratory evaluations, including JC virus titer,  Chronic Insomnia   Clonazepam as needed   Marcial Pacas, M.D. Ph.D.  [facility] [address], [place], [place] [address] Ph: [phone] Fax: [phone]  CC: Referring Provider

## 2019-08-23 LAB — CBC WITH DIFFERENTIAL/PLATELET
Basophils Absolute: 0 10*3/uL (ref 0.0–0.2)
Basos: 1 %
EOS (ABSOLUTE): 0.3 10*3/uL (ref 0.0–0.4)
Eos: 4 %
Hematocrit: 40.9 % (ref 37.5–51.0)
Hemoglobin: 13.9 g/dL (ref 13.0–17.7)
Immature Grans (Abs): 0 10*3/uL (ref 0.0–0.1)
Immature Granulocytes: 1 %
Lymphocytes Absolute: 3.2 10*3/uL — ABNORMAL HIGH (ref 0.7–3.1)
Lymphs: 43 %
MCH: 28.2 pg (ref 26.6–33.0)
MCHC: 34 g/dL (ref 31.5–35.7)
MCV: 83 fL (ref 79–97)
Monocytes Absolute: 0.5 10*3/uL (ref 0.1–0.9)
Monocytes: 7 %
Neutrophils Absolute: 3.2 10*3/uL (ref 1.4–7.0)
Neutrophils: 44 %
Platelets: 161 10*3/uL (ref 150–450)
RBC: 4.93 x10E6/uL (ref 4.14–5.80)
RDW: 13.4 % (ref 11.6–15.4)
WBC: 7.3 10*3/uL (ref 3.4–10.8)

## 2019-08-23 LAB — COMPREHENSIVE METABOLIC PANEL
ALT: 14 IU/L (ref 0–44)
AST: 16 IU/L (ref 0–40)
Albumin/Globulin Ratio: 1.6 (ref 1.2–2.2)
Albumin: 4.2 g/dL (ref 4.0–5.0)
Alkaline Phosphatase: 81 IU/L (ref 39–117)
BUN/Creatinine Ratio: 11 (ref 9–20)
BUN: 12 mg/dL (ref 6–24)
Bilirubin Total: 1.1 mg/dL (ref 0.0–1.2)
CO2: 21 mmol/L (ref 20–29)
Calcium: 8.9 mg/dL (ref 8.7–10.2)
Chloride: 101 mmol/L (ref 96–106)
Creatinine, Ser: 1.06 mg/dL (ref 0.76–1.27)
GFR calc Af Amer: 95 mL/min/{1.73_m2} (ref >59)
GFR calc non Af Amer: 82 mL/min/{1.73_m2} (ref >59)
Globulin, Total: 2.6 g/dL (ref 1.5–4.5)
Glucose: 86 mg/dL (ref 65–99)
Potassium: 3.9 mmol/L (ref 3.5–5.2)
Sodium: 138 mmol/L (ref 134–144)
Total Protein: 6.8 g/dL (ref 6.0–8.5)

## 2019-08-23 LAB — VITAMIN D 25 HYDROXY (VIT D DEFICIENCY, FRACTURES): Vit D, 25-Hydroxy: 26.7 ng/mL — ABNORMAL LOW (ref 30.0–100.0)

## 2019-08-23 LAB — TSH: TSH: 1.42 u[IU]/mL (ref 0.450–4.500)

## 2019-08-28 ENCOUNTER — Other Ambulatory Visit: Payer: Self-pay

## 2019-08-28 ENCOUNTER — Ambulatory Visit (INDEPENDENT_AMBULATORY_CARE_PROVIDER_SITE_OTHER): Payer: Medicare Other

## 2019-08-28 DIAGNOSIS — Z23 Encounter for immunization: Secondary | ICD-10-CM

## 2019-09-04 ENCOUNTER — Telehealth: Payer: Self-pay | Admitting: *Deleted

## 2019-09-04 NOTE — Telephone Encounter (Signed)
Labs collected 08/21/2019:  JCV 0.22 Indeterminate, Inhibition Assay: negative

## 2019-10-31 ENCOUNTER — Encounter: Payer: Self-pay | Admitting: Internal Medicine

## 2020-01-23 ENCOUNTER — Ambulatory Visit (INDEPENDENT_AMBULATORY_CARE_PROVIDER_SITE_OTHER): Payer: Medicare Other | Admitting: Family Medicine

## 2020-01-23 ENCOUNTER — Other Ambulatory Visit: Payer: Self-pay

## 2020-01-23 ENCOUNTER — Encounter: Payer: Self-pay | Admitting: Family Medicine

## 2020-01-23 VITALS — BP 120/80 | HR 80 | Resp 14

## 2020-01-23 DIAGNOSIS — R222 Localized swelling, mass and lump, trunk: Secondary | ICD-10-CM | POA: Diagnosis not present

## 2020-01-23 DIAGNOSIS — T485X5A Adverse effect of other anti-common-cold drugs, initial encounter: Secondary | ICD-10-CM

## 2020-01-23 DIAGNOSIS — J310 Chronic rhinitis: Secondary | ICD-10-CM | POA: Diagnosis not present

## 2020-01-23 MED ORDER — FLUTICASONE PROPIONATE 50 MCG/ACT NA SUSP: 2.0000 | Freq: Every day | NASAL | 6 refills | Status: DC

## 2020-01-23 MED ORDER — PREDNISONE 20 MG PO TABS: ORAL_TABLET | ORAL | 0 refills | Status: DC

## 2020-01-23 NOTE — Progress Notes (Signed)
Subjective:    Patient ID: Jonathon Vazquez, male    DOB: 09-01-1971, 49 y.o.   MRN: [id number]  HPI Patient has 2 issues today.  Over the last week he has noticed a nodular density appear in his upper right chest just below the clavicle and medial to the shoulder.  It is approximately 1.5 cm in diameter.  It is subcutaneous in location and firm and rubbery in texture.  It is nontender.  There is no erythema.  He denies any pain.  He denies any fevers or chills.  The location would be suspicious for a cyst however the texture feels more like a lymph node.  Second issue is runny nose.  For the last 2 weeks has been using Afrin twice a day.  However after 12 hours, he states that his nose will "swell up and close all of".  He states that he has very difficult time breathing through his nose at night. Past Medical History:  Diagnosis Date  . Anal fistula 1996  . Anal stenosis   . Atrial fibrillation ([place])    pt and wife deny this  . Colon stricture (HCC)    descending colon  . Crohn's disease ([place])   . Hx of colonic polyp 10/21/2016  . MRSA (methicillin resistant Staphylococcus aureus)   . Multiple sclerosis ([place])   . Neuromuscular disorder ([place])   . Patent ductus arteriosus   . Regional enteritis of large intestine ([place])   . S/P left hemicolectomy 2015   [facility]-done for stricture  . Stricture of descending colon due to Crohn's disease 02/13/2012   Past Surgical History:  Procedure Laterality Date  . COLON SURGERY  2015   descending colon stricture   . COLONOSCOPY     multiple   Current Outpatient Medications on File Prior to Visit  Medication Sig Dispense Refill  . acetaminophen (TYLENOL) 325 MG tablet Take 650 mg by mouth every 6 (six) hours as needed for mild pain.     . chlorhexidine (HIBICLENS) 4 % external liquid Apply topically daily as needed. 120 mL 11  . clonazePAM (KLONOPIN) 0.5 MG tablet Take 1 tablet (0.5 mg total) by mouth at bedtime as needed for anxiety. 30 tablet 5  .  mupirocin cream (BACTROBAN) 2 % Apply 1 application topically 2 (two) times daily. 15 g 0  . natalizumab (TYSABRI) 300 MG/15ML injection Inject into the vein. 1 hour infusion monthly     No current facility-administered medications on file prior to visit.   Allergies  Allergen Reactions  . Lialda [Mesalamine] Diarrhea    Severe diarrhea   Social History   Socioeconomic History  . Marital status: Married    Spouse name: [name]  . Number of children: 3  . Years of education: 25  . Highest education level: Not on file  Occupational History  . Occupation: disabled  Tobacco Use  . Smoking status: Former Smoker    Packs/day: 1.00    Years: 15.00    Pack years: 15.00    Types: Cigarettes    Quit date: 10/24/2004    Years since quitting: 15.2  . Smokeless tobacco: Never Used  Substance and Sexual Activity  . Alcohol use: Never  . Drug use: Never  . Sexual activity: Yes  Other Topics Concern  . Not on file  Social History Narrative   Married, 1 son one daughter. 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 Multimedia programmer)   Disabled, previously worked at [employer], disability due to multiple sclerosis   Caffeine one  daily   Left handed.   Education- one year of college.   Social Determinants of Health   Financial Resource Strain:   . Difficulty of Paying Living Expenses:   Food Insecurity:   . Worried About Charity fundraiser in the Last Year:   . Arboriculturist in the Last Year:   Transportation Needs:   . Film/video editor (Medical):   Marland Kitchen Lack of Transportation (Non-Medical):   Physical Activity:   . Days of Exercise per Week:   . Minutes of Exercise per Session:   Stress:   . Feeling of Stress :   Social Connections:   . Frequency of Communication with Friends and Family:   . Frequency of Social Gatherings with Friends and Family:   . Attends Religious Services:   . Active Member of Clubs or Organizations:   . Attends Archivist Meetings:   Marland Kitchen Marital Status:   Intimate Partner  Violence:   . Fear of Current or Ex-Partner:   . Emotionally Abused:   Marland Kitchen Physically Abused:   . Sexually Abused:       Review of Systems  All other systems reviewed and are negative.      Objective:   Physical Exam Vitals reviewed.  HENT:     Nose: Congestion present.  Cardiovascular:     Rate and Rhythm: Normal rate and regular rhythm.  Pulmonary:     Effort: Pulmonary effort is normal.     Breath sounds: Normal breath sounds.  Chest:     Breasts:        Right: Mass present.             Assessment & Plan:  Mass in chest  Rhinitis medicamentosa  I believe the patient has developed rhinitis medicamentosa.  Discontinue Afrin and replace with Flonase 2 sprays each nostril daily.  If the Flonase is not sufficient he can add a short taper pack of prednisone to facilitate breathing at night as his nostrils are considerably swollen basically shut. I believe the mass in his chest is most likely a sebaceous cyst although it does have a different texture.  I have recommended an excisional biopsy.  He can schedule this next week at his earliest convenience.

## 2020-02-03 ENCOUNTER — Other Ambulatory Visit: Payer: Self-pay

## 2020-02-03 ENCOUNTER — Encounter: Payer: Self-pay | Admitting: Family Medicine

## 2020-02-03 ENCOUNTER — Ambulatory Visit: Payer: Medicare Other | Admitting: Family Medicine

## 2020-02-03 VITALS — BP 120/72 | HR 96 | Temp 97.6°F | Resp 14 | Ht 71.0 in | Wt 208.0 lb

## 2020-02-03 DIAGNOSIS — R222 Localized swelling, mass and lump, trunk: Secondary | ICD-10-CM | POA: Diagnosis not present

## 2020-02-03 NOTE — Progress Notes (Signed)
Subjective:    Patient ID: Jonathon Vazquez, male    DOB: 1971/07/04, 49 y.o.   MRN: [id number]  HPI  Patient is a very pleasant 49 year old African-American male here today to have a mass removed from his right chest wall.  At his last physical exam, there was a 2 cm approximately subcutaneous mass on his right upper chest wall to the lateral of the pectoralis muscle just below the clavicle.  It was freely mobile nodular in nature.  However it was firm and did not have the consistency of sebaceous cyst.  Therefore we recommend an excisional biopsy. Past Medical History:  Diagnosis Date  . Anal fistula 1996  . Anal stenosis   . Atrial fibrillation ([place])    pt and wife deny this  . Colon stricture (HCC)    descending colon  . Crohn's disease ([place])   . Hx of colonic polyp 10/21/2016  . MRSA (methicillin resistant Staphylococcus aureus)   . Multiple sclerosis ([place])   . Neuromuscular disorder ([place])   . Patent ductus arteriosus   . Regional enteritis of large intestine ([place])   . S/P left hemicolectomy 2015   [facility]-done for stricture  . Stricture of descending colon due to Crohn's disease 02/13/2012   Past Surgical History:  Procedure Laterality Date  . COLON SURGERY  2015   descending colon stricture   . COLONOSCOPY     multiple   Current Outpatient Medications on File Prior to Visit  Medication Sig Dispense Refill  . acetaminophen (TYLENOL) 325 MG tablet Take 650 mg by mouth every 6 (six) hours as needed for mild pain.     . chlorhexidine (HIBICLENS) 4 % external liquid Apply topically daily as needed. 120 mL 11  . clonazePAM (KLONOPIN) 0.5 MG tablet Take 1 tablet (0.5 mg total) by mouth at bedtime as needed for anxiety. 30 tablet 5  . fluticasone (FLONASE) 50 MCG/ACT nasal spray Place 2 sprays into both nostrils daily. 16 g 6  . mupirocin cream (BACTROBAN) 2 % Apply 1 application topically 2 (two) times daily. 15 g 0  . natalizumab (TYSABRI) 300 MG/15ML injection Inject into the  vein. 1 hour infusion monthly     No current facility-administered medications on file prior to visit.   Allergies  Allergen Reactions  . Lialda [Mesalamine] Diarrhea    Severe diarrhea   Social History   Socioeconomic History  . Marital status: Married    Spouse name: [name]  . Number of children: 3  . Years of education: 42  . Highest education level: Not on file  Occupational History  . Occupation: disabled  Tobacco Use  . Smoking status: Former Smoker    Packs/day: 1.00    Years: 15.00    Pack years: 15.00    Types: Cigarettes    Quit date: 10/24/2004    Years since quitting: 15.2  . Smokeless tobacco: Never Used  Substance and Sexual Activity  . Alcohol use: Never  . Drug use: Never  . Sexual activity: Yes  Other Topics Concern  . Not on file  Social History Narrative   Married, 1 son one daughter. 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 Multimedia programmer)   Disabled, previously worked at [employer], disability due to multiple sclerosis   Caffeine one daily   Left handed.   Education- one year of college.   Social Determinants of Health   Financial Resource Strain:   . Difficulty of Paying Living Expenses:   Food Insecurity:   . Worried About Estate manager/land agent  of Food in the Last Year:   . [place] in the Last Year:   Transportation Needs:   . Lack of Transportation (Medical):   Marland Kitchen Lack of Transportation (Non-Medical):   Physical Activity:   . Days of Exercise per Week:   . Minutes of Exercise per Session:   Stress:   . Feeling of Stress :   Social Connections:   . Frequency of Communication with Friends and Family:   . Frequency of Social Gatherings with Friends and Family:   . Attends Religious Services:   . Active Member of Clubs or Organizations:   . Attends Archivist Meetings:   Marland Kitchen Marital Status:   Intimate Partner Violence:   . Fear of Current or Ex-Partner:   . Emotionally Abused:   Marland Kitchen Physically Abused:   . Sexually Abused:      Review of Systems  All other  systems reviewed and are negative.      Objective:   Physical Exam Vitals reviewed.  Constitutional:      Appearance: Normal appearance.  Cardiovascular:     Rate and Rhythm: Normal rate and regular rhythm.     Heart sounds: Normal heart sounds.  Pulmonary:     Effort: Pulmonary effort is normal.     Breath sounds: Normal breath sounds.  Chest:    Neurological:     Mental Status: He is alert.           Assessment & Plan:  Mass of chest wall, right - Plan: Pathology Report ([facility])  I suspect a sebaceous cyst however it has a firm nodular texture and therefore I cannot be sure.  Patient would like it removed.  The lesion was anesthetized using 0.1% lidocaine with epinephrine.  He was then prepped and draped in sterile fashion.  A 3 cm x 2.5 cm ellipse was made around the entire lesion down to the subcutaneous fascia.  The nodular lesion was removed in its entirety.  The skin edges were then reapproximated with 5 simple interrupted 3-0 Ethilon sutures.  Patient will return in 1 week for suture removal.  Lesion was sent to pathology in a labeled container.

## 2020-02-04 ENCOUNTER — Other Ambulatory Visit: Payer: Self-pay | Admitting: *Deleted

## 2020-02-04 DIAGNOSIS — Z79899 Other long term (current) drug therapy: Secondary | ICD-10-CM

## 2020-02-04 DIAGNOSIS — G35 Multiple sclerosis: Secondary | ICD-10-CM

## 2020-02-04 NOTE — Addendum Note (Signed)
Addended by: Inis Sizer D on: 02/04/2020 03:27 PM   Modules accepted: Orders

## 2020-02-05 LAB — CBC WITH DIFFERENTIAL/PLATELET
Basophils Absolute: 0 10*3/uL (ref 0.0–0.2)
Basos: 1 %
EOS (ABSOLUTE): 0.3 10*3/uL (ref 0.0–0.4)
Eos: 5 %
Hematocrit: 41.5 % (ref 37.5–51.0)
Hemoglobin: 14 g/dL (ref 13.0–17.7)
Immature Grans (Abs): 0 10*3/uL (ref 0.0–0.1)
Immature Granulocytes: 1 %
Lymphocytes Absolute: 2.9 10*3/uL (ref 0.7–3.1)
Lymphs: 41 %
MCH: 28.2 pg (ref 26.6–33.0)
MCHC: 33.7 g/dL (ref 31.5–35.7)
MCV: 84 fL (ref 79–97)
Monocytes Absolute: 0.6 10*3/uL (ref 0.1–0.9)
Monocytes: 9 %
Neutrophils Absolute: 3.1 10*3/uL (ref 1.4–7.0)
Neutrophils: 43 %
Platelets: 166 10*3/uL (ref 150–450)
RBC: 4.96 x10E6/uL (ref 4.14–5.80)
RDW: 13.4 % (ref 11.6–15.4)
WBC: 7 10*3/uL (ref 3.4–10.8)

## 2020-02-05 LAB — PATHOLOGY REPORT

## 2020-02-05 LAB — TISSUE SPECIMEN

## 2020-02-13 ENCOUNTER — Ambulatory Visit: Payer: Medicare Other | Admitting: Family Medicine

## 2020-02-13 ENCOUNTER — Other Ambulatory Visit: Payer: Self-pay

## 2020-02-13 VITALS — BP 110/70 | HR 70 | Temp 96.4°F | Resp 12 | Ht 71.0 in | Wt 205.0 lb

## 2020-02-13 DIAGNOSIS — Z4802 Encounter for removal of sutures: Secondary | ICD-10-CM

## 2020-02-13 NOTE — Progress Notes (Signed)
Subjective:    Patient ID: Jonathon Vazquez, male    DOB: 05/19/1971, 49 y.o.   MRN: [id number]  HPI 02/03/20 Patient is a very pleasant 49 year old African-American male here today to have a mass removed from his right chest wall.  At his last physical exam, there was a 2 cm approximately subcutaneous mass on his right upper chest wall to the lateral of the pectoralis muscle just below the clavicle.  It was freely mobile nodular in nature.  However it was firm and did not have the consistency of sebaceous cyst.  Therefore we recommend an excisional biopsy.  At that time, my plan was: I suspect a sebaceous cyst however it has a firm nodular texture and therefore I cannot be sure.  Patient would like it removed.  The lesion was anesthetized using 0.1% lidocaine with epinephrine.  He was then prepped and draped in sterile fashion.  A 3 cm x 2.5 cm ellipse was made around the entire lesion down to the subcutaneous fascia.  The nodular lesion was removed in its entirety.  The skin edges were then reapproximated with 5 simple interrupted 3-0 Ethilon sutures.  Patient will return in 1 week for suture removal.  Lesion was sent to pathology in a labeled container.  02/13/20 Biopsy revealed an epidermoid cyst.  He is here today for suture removal. Past Medical History:  Diagnosis Date  . Anal fistula 1996  . Anal stenosis   . Atrial fibrillation ([place])    pt and wife deny this  . Colon stricture (HCC)    descending colon  . Crohn's disease ([place])   . Hx of colonic polyp 10/21/2016  . MRSA (methicillin resistant Staphylococcus aureus)   . Multiple sclerosis ([place])   . Neuromuscular disorder ([place])   . Patent ductus arteriosus   . Regional enteritis of large intestine ([place])   . S/P left hemicolectomy 2015   [facility] for stricture  . Stricture of descending colon due to Crohn's disease 02/13/2012   Past Surgical History:  Procedure Laterality Date  . COLON SURGERY  2015   descending colon stricture    . COLONOSCOPY     multiple   Current Outpatient Medications on File Prior to Visit  Medication Sig Dispense Refill  . acetaminophen (TYLENOL) 325 MG tablet Take 650 mg by mouth every 6 (six) hours as needed for mild pain.     . chlorhexidine (HIBICLENS) 4 % external liquid Apply topically daily as needed. 120 mL 11  . clonazePAM (KLONOPIN) 0.5 MG tablet Take 1 tablet (0.5 mg total) by mouth at bedtime as needed for anxiety. 30 tablet 5  . fluticasone (FLONASE) 50 MCG/ACT nasal spray Place 2 sprays into both nostrils daily. 16 g 6  . mupirocin cream (BACTROBAN) 2 % Apply 1 application topically 2 (two) times daily. 15 g 0  . natalizumab (TYSABRI) 300 MG/15ML injection Inject into the vein. 1 hour infusion monthly     No current facility-administered medications on file prior to visit.   Allergies  Allergen Reactions  . Lialda [Mesalamine] Diarrhea    Severe diarrhea   Social History   Socioeconomic History  . Marital status: Married    Spouse name: [name]  . Number of children: 3  . Years of education: 29  . Highest education level: Not on file  Occupational History  . Occupation: disabled  Tobacco Use  . Smoking status: Former Smoker    Packs/day: 1.00    Years: 15.00    Pack  years: 15.00    Types: Cigarettes    Quit date: 10/24/2004    Years since quitting: 15.3  . Smokeless tobacco: Never Used  Substance and Sexual Activity  . Alcohol use: Never  . Drug use: Never  . Sexual activity: Yes  Other Topics Concern  . Not on file  Social History Narrative   Married, 1 son one daughter. 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 Multimedia programmer)   Disabled, previously worked at [employer], disability due to multiple sclerosis   Caffeine one daily   Left handed.   Education- one year of college.   Social Determinants of Health   Financial Resource Strain:   . Difficulty of Paying Living Expenses:   Food Insecurity:   . Worried About Charity fundraiser in the Last Year:   . Arboriculturist in the  Last Year:   Transportation Needs:   . Film/video editor (Medical):   Marland Kitchen Lack of Transportation (Non-Medical):   Physical Activity:   . Days of Exercise per Week:   . Minutes of Exercise per Session:   Stress:   . Feeling of Stress :   Social Connections:   . Frequency of Communication with Friends and Family:   . Frequency of Social Gatherings with Friends and Family:   . Attends Religious Services:   . Active Member of Clubs or Organizations:   . Attends Archivist Meetings:   Marland Kitchen Marital Status:   Intimate Partner Violence:   . Fear of Current or Ex-Partner:   . Emotionally Abused:   Marland Kitchen Physically Abused:   . Sexually Abused:      Review of Systems  All other systems reviewed and are negative.      Objective:   Physical Exam Vitals reviewed.  Constitutional:      Appearance: Normal appearance.  Cardiovascular:     Rate and Rhythm: Normal rate and regular rhythm.     Heart sounds: Normal heart sounds.  Pulmonary:     Effort: Pulmonary effort is normal.     Breath sounds: Normal breath sounds.  Chest:    Neurological:     Mental Status: He is alert.    5 sutures in place without any evidence of cellulitis, erythema, fluctuance or abscess       Assessment & Plan:  Visit for suture removal Wound appears well-healed.  All 5 sutures were removed without difficulty.  Steri-Strips were applied to reinforce the wound for an additional 3 to 4 days.

## 2020-02-17 ENCOUNTER — Telehealth: Payer: Self-pay | Admitting: *Deleted

## 2020-02-17 NOTE — Telephone Encounter (Signed)
Labs collected on 02/04/20:  JCV 0.29 Indeterminate, Inhibition Assay: negative  Printed copy provided to Dr. Krista Blue for review then will be sent for scanning.

## 2020-02-24 ENCOUNTER — Encounter: Payer: Self-pay | Admitting: Family Medicine

## 2020-02-24 ENCOUNTER — Ambulatory Visit: Payer: Medicare Other | Admitting: Family Medicine

## 2020-02-24 ENCOUNTER — Other Ambulatory Visit: Payer: Self-pay

## 2020-02-24 VITALS — BP 104/71 | HR 75 | Temp 97.5°F | Ht 71.0 in | Wt 204.0 lb

## 2020-02-24 DIAGNOSIS — Z79899 Other long term (current) drug therapy: Secondary | ICD-10-CM

## 2020-02-24 DIAGNOSIS — K5000 Crohn's disease of small intestine without complications: Secondary | ICD-10-CM

## 2020-02-24 DIAGNOSIS — G35 Multiple sclerosis: Secondary | ICD-10-CM | POA: Diagnosis not present

## 2020-02-24 NOTE — Progress Notes (Signed)
PATIENT: Jonathon Vazquez DOB: [birth year]  REASON FOR VISIT: follow up HISTORY FROM: patient  Chief Complaint  Patient presents with   Follow-up    Follow up for MS room in back hallway pt alone     HISTORY: (copied from Dr Rhea Belton note on 08/22/2019)  HISTORY OF PRESENT ILLNESS:He was seen by his [facility] since 2000, initially presenting with facial numbness, MRI demonstrates lesions consistent with MS, however he has lost followups, was suggested Copaxone in 2004, but never followed through, he was reevaluated by Dr. Ron Parker in February of 2008, he began to have bladder incontinence, dizziness, blurry vision, unbalanced gait, repeat MRI at that time continued to demonstrate multiple foci of signal hyperintensity, involving brainstem, superior peduncle, deep white matters, there was pericallosal involvement, no enhancement, but there was positive DWIs lesion, he was started on Avenox since 06/13/2007, he continued to have flulike illness following injection,  He also reported a history of Crohn's disease, last treatment was in 2004 with [place], he no longer has significant GI symptoms, He was treated with immunosuppressive treatment mercaptopurine in late 1990s, for 3-4 months, still had diahrrea.  He had one to 2 flareups each year, presenting with increased dizziness, unbalanced gait, blurry vision, worsening memory trouble, often partial improvement with steroid package.  Most recent MRI cervical was in July 2011, remote demyelinating plaque at C2 and C4, no enhancing lesions.  Repeat MRI brain and cervical in Jan 2013 showed a defined hyperintensity in the spinal cord at C2 which likely represents remote age inactive demyelinating plaque. No enhancing lesions are noted. As compared with previous MRI dated 04/30/2010 the C4 spinal cord hyperintensity is no longer seen.  MRI scan of the brain showed multiple brainstem, cerebellar, corpus callosal and periventricular white matter  hyperintensities consistent with multiple sclerosis. No enhancing lesions are noted. Presence of T1 black holes andd corpus callosal atrophy indicate chronic disease. Overall no significant changes compared with MRI from 01/23/2007   He is doing very well, there was no new neurological symptoms, he is taking Avonex, mild flulike illness right out of the injection, improved by Tylenol, or aleve. He complains of frequent bilateral frontal headaches.  We have reviewed MRI of the brain,which has demonstratedmultiple periventricular and subcortical and infratentorial chronic demyelinating plaques. No acute plaques. Compared to prior MRI on 01/23/07, there are has been disease progression.  He continues to do very well, reported prior to MRI scan, he has been off of his Avonex for 4 months, due to insurance reasons, after discussed with Mr. Saiki we decided to stay on current Avonex treatment, overall only slight change in his MRI scan there is no significant clinical worsening, we will repeat MRI of the brain and cervical spine with and without contrast in one year.  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 Carlean Purl, he has been treated with prednisone, mercaptopurine in 2011 125m daily for one year, ( which is a nucleoside metabolic inhibitor that results in cell-cycle arrest and death)and mesalamine in the past,  We decided not to proceed with [place] Keep him on Avonex he complains of 3 weeks history of left arm numbness, since January 2015, has much improved now,   UPDATE 07/28/2014:YY He had laparoscopic surgery  in Sept 3rd 2015 at [facility], which did help him,   He has more memory  loss. He also complains of fatigue. No recurrent mass symptoms, he denied gait difficulty, no visual disorder  We have reviewed MRI of brain: scattered bilateral periventricular, subcortical, corpus callosum, cerebellar and brainstem white matter hyperintensities typical for demyelinating disease. No enhancing lesions are noted. The presence of T1 black holes and mild atrophy of corpus callosum indicates chronic disease. Compared with previous MRI scan dated 04/22/13 there appears to be slight disease activity progression   MRI scan of the cervical spine showing left lateral disc herniation at C5-6 with significant narrowing of the foramina. Spinal cord hyperintensities at C2, C4-5 and in the brainstem and cerebellum likely represent remote age demyelinating plaques. No enhancing lesions are noted.  UPDATE Jan 26th 2016:YY He complains of increased short term memory loss, no significant gait difficulty, no vision loss does complains of fatigue, especially with heat exposure We have reviewed most recent MRI of the brain in July 2016 with without contrast, in comparison to MRI of brain in 2008, and 2015, multiple rounded and ovoid, periventricular, subcortical, juxtacortical and infratentorial chronic demyelinating plaque, some T1 black holes, no abnormal enhancement, no change compared to previous scans  UPDATE Sep 24 2015:YY He came in with his wife, and children at today's clinical visit, I have personally reviewed MRI of the film in 2016, in comparison with 2008, there was mild progression, he does has slow worsening mild gait difficulty, after extensive discussion, we decided proceed with Tysarbri IV infusion, he has history of Crohn's disease, 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 He denies confusion,no headaches, He also complains of new onset insomnia, difficulty falling sleep,   Virtual Visit via Video February 11 2019:   He is overall doing very well, he joined the video conference with his wife, has been getting monthly Tysabri treatment, no significant side effect,  His Crohn's disease is under good control since he started on Tysabri  UPDATE Aug 22 2019: He is doing very well, no recurrent neurological spells, Crohn's disease is also under good control with [place]  I personally reviewed MRI of the brain with without contrast in October 2020: Multiple T2/flair hyperintensity foci in the brainstem, cerebellum, left thalamus, in a pattern and configuration consistent with chronic demyelinating plaque, no change compared to September 2019, no contrast-enhancement  UPDATE Feb 24, 2020:  RRAZIEL KOENIGSis a 49y.o. 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 He is scheduled for his next infusion 5/11. He is doing well. He had recent labs that were normal. Last MRI in 2020 stable. He denies new or exacerbating symptoms. He does report more trouble remembering information. Sometimes he has word finding difficulty. He is completely independent. Driving without difficulty. He is sleeping well. He is eating regular, well balanced meals and tries to stay active.    REVIEW OF SYSTEMS: Out of  a complete 14 system review of symptoms, the patient complains only of the following symptoms, word finding difficulty and all other reviewed systems are negative.  ALLERGIES: Allergies  Allergen Reactions   Lialda [Mesalamine] Diarrhea    Severe  diarrhea    HOME MEDICATIONS: Outpatient Medications Prior to Visit  Medication Sig Dispense Refill   acetaminophen (TYLENOL) 325 MG tablet Take 650 mg by mouth every 6 (six) hours as needed for mild pain.      chlorhexidine (HIBICLENS) 4 % external liquid Apply topically daily as needed. 120 mL 11   clonazePAM (KLONOPIN) 0.5 MG tablet Take 1 tablet (0.5 mg total) by mouth at bedtime as needed for anxiety. 30 tablet 5   fluticasone (FLONASE) 50 MCG/ACT nasal spray Place 2 sprays into both nostrils daily. 16 g 6   mupirocin cream (BACTROBAN) 2 % Apply 1 application topically 2 (two) times daily. 15 g 0   natalizumab (TYSABRI) 300 MG/15ML injection Inject into the vein. 1 hour infusion monthly     No facility-administered medications prior to visit.    PAST MEDICAL HISTORY: Past Medical History:  Diagnosis Date   Anal fistula 1996   Anal stenosis    Atrial fibrillation (HCC)    pt and wife deny this   Colon stricture ([place])    descending colon   Crohn's disease ([place])    Hx of colonic polyp 10/21/2016   MRSA (methicillin resistant Staphylococcus aureus)    Multiple sclerosis (HCC)    Neuromuscular disorder ([place])    Patent ductus arteriosus    Regional enteritis of large intestine (HCC)    S/P left hemicolectomy 2015   [facility] for stricture   Stricture of descending colon due to Crohn's disease 02/13/2012    PAST SURGICAL HISTORY: Past Surgical History:  Procedure Laterality Date   COLON SURGERY  2015   descending colon stricture    COLONOSCOPY     multiple    FAMILY HISTORY: Family History  Problem Relation Age of Onset   Ulcerative colitis Father    Heart disease Mother    Prostate cancer Paternal Grandfather    Diabetes Paternal Grandmother    Colon cancer Neg Hx    Rectal cancer Neg Hx    Stomach cancer Neg Hx     SOCIAL HISTORY: Social History   Socioeconomic History   Marital status: Married    Spouse name: [name]    Number of children: 3   Years of education: 13   Highest education level: Not on file  Occupational History   Occupation: disabled  Tobacco Use   Smoking status: Former Smoker    Packs/day: 1.00    Years: 15.00    Pack years: 15.00    Types: Cigarettes    Quit date: 10/24/2004    Years since quitting: 15.3   Smokeless tobacco: Never Used  Substance and Sexual Activity   Alcohol use: Never   Drug use: Never   Sexual activity: Yes  Other Topics Concern   Not on file  Social History Narrative   Married, 1 son one daughter. 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 Multimedia programmer)   Disabled, previously worked at [employer], disability due to multiple sclerosis   Caffeine one daily   Left handed.   Education- one year of college.   Social Determinants of Health   Financial Resource Strain:    Difficulty of Paying Living Expenses:   Food Insecurity:    Worried About Charity fundraiser in the Last Year:    Ran  Out of Food in the Last Year:   Transportation Needs:    Lack of Transportation (Medical):    Lack of Transportation (Non-Medical):   Physical Activity:    Days of Exercise per Week:    Minutes of Exercise per Session:   Stress:    Feeling of Stress :   Social Connections:    Frequency of Communication with Friends and Family:    Frequency of Social Gatherings with Friends and Family:    Attends Religious Services:    Active Member of Clubs or Organizations:    Attends Archivist Meetings:    Marital Status:   Intimate Partner Violence:    Fear of Current or Ex-Partner:    Emotionally Abused:    Physically Abused:    Sexually Abused:       PHYSICAL EXAM  Vitals:   02/24/20 1528  BP: 104/71  Pulse: 75  Temp: (!) 97.5 F (36.4 C)  Weight: 204 lb (92.5 kg)  Height: 5' 11"  (1.803 m)   Body mass index is 28.45 kg/m.  Generalized: Well developed, in no acute distress  Cardiology: normal rate and rhythm, no murmur noted Respiratory: clear to  auscultation bilaterally  Neurological examination  Mentation: Alert oriented to time, place, history taking. Follows all commands speech and language fluent Cranial nerve II-XII: Pupils were equal round reactive to light. Extraocular movements were full, visual field were full on confrontational test. Facial sensation and strength were normal. Uvula tongue midline. Head turning and shoulder shrug  were normal and symmetric. Motor: The motor testing reveals 5 over 5 strength of all 4 extremities. Good symmetric motor tone is noted throughout.  Sensory: Sensory testing is intact to soft touch on all 4 extremities. No evidence of extinction is noted.  Coordination: Cerebellar testing reveals good finger-nose-finger and heel-to-shin bilaterally.  Gait and station: Gait is normal. .  Reflexes: Deep tendon reflexes are symmetric and normal bilaterally.   DIAGNOSTIC DATA (LABS, IMAGING, TESTING) - I reviewed patient records, labs, notes, testing and imaging myself where available.  No flowsheet data found.   Lab Results  Component Value Date   WBC 7.0 02/04/2020   HGB 14.0 02/04/2020   HCT 41.5 02/04/2020   MCV 84 02/04/2020   PLT 166 02/04/2020      Component Value Date/Time   NA 138 08/22/2019 1515   K 3.9 08/22/2019 1515   CL 101 08/22/2019 1515   CO2 21 08/22/2019 1515   GLUCOSE 86 08/22/2019 1515   GLUCOSE 116 (H) 06/26/2018 0408   BUN 12 08/22/2019 1515   CREATININE 1.06 08/22/2019 1515   CREATININE 1.19 05/25/2018 0836   CALCIUM 8.9 08/22/2019 1515   PROT 6.8 08/22/2019 1515   ALBUMIN 4.2 08/22/2019 1515   AST 16 08/22/2019 1515   ALT 14 08/22/2019 1515   ALKPHOS 81 08/22/2019 1515   BILITOT 1.1 08/22/2019 1515   GFRNONAA 82 08/22/2019 1515   GFRNONAA 78 09/29/2016 1100   GFRAA 95 08/22/2019 1515   GFRAA >89 09/29/2016 1100   Lab Results  Component Value Date   CHOL 202 (H) 05/25/2018   HDL 56 05/25/2018   LDLCALC 130 (H) 05/25/2018   TRIG 70 05/25/2018   CHOLHDL  3.6 05/25/2018   No results found for: HGBA1C No results found for: VITAMINB12 Lab Results  Component Value Date   TSH 1.420 08/22/2019       ASSESSMENT AND PLAN 49 y.o. year old male  has a past medical history of Anal  fistula (1996), Anal stenosis, Atrial fibrillation ([place]), Colon stricture ([place]), Crohn's disease ([place]), colonic polyp (10/21/2016), MRSA (methicillin resistant Staphylococcus aureus), Multiple sclerosis ([place]), Neuromuscular disorder ([place]), Patent ductus arteriosus, Regional enteritis of large intestine ([place]), S/P left hemicolectomy (2015), and Stricture of descending colon due to Crohn's disease (02/13/2012). here with     ICD-10-CM   1. Relapsing remitting multiple sclerosis ([place])  G35   2. High risk medication use  Z79.899   3. Crohn's disease of small intestine without complication ([place])  B44.96     He is doing well today.  He is tolerating Tysabri infusions without obvious adverse effects.  No new or exacerbating symptoms.  We will continue current.  We have discussed concerns of word finding difficulty.  No other neurodegenerative memory loss concerns.  We have reviewed memory compensation strategies.  He will ensure adequate sleep, hydration and continue well-balanced diet with regular exercise.  Labs from last infusion reviewed for today's visit.  MRI was stable in 2020.  He will continue close follow-up with primary care and GI as directed.  Crohn's is stable.  He will follow-up in 6 months, sooner if needed.  He verbalizes understanding and agreement with this plan.   No orders of the defined types were placed in this encounter.    No orders of the defined types were placed in this encounter.     I spent 15 minutes with the patient. 50% of this time was spent counseling and educating patient on plan of care and medications.    Debbora Presto, FNP-C 02/24/2020, 3:33 PM [facility] [address], [place], [place] [address] [phone]

## 2020-02-24 NOTE — Patient Instructions (Signed)
We will continue current treatment plan. Stay well hydrated. Focus on healthy lifestyle habits.   Follow up with [place] in 6 months   Memory Compensation Strategies  1. Use "WARM" strategy.  W= write it down  A= associate it  R= repeat it  M= make a mental note  2.   You can keep a Social worker.  Use a 3-ring notebook with sections for the following: calendar, important names and phone numbers,  medications, doctors' names/phone numbers, lists/reminders, and a section to journal what you did  each day.   3.    Use a calendar to write appointments down.  4.    Write yourself a schedule for the day.  This can be placed on the calendar or in a separate section of the Memory Notebook.  Keeping a  regular schedule can help memory.  5.    Use medication organizer with sections for each day or morning/evening pills.  You may need help loading it  6.    Keep a basket, or pegboard by the door.  Place items that you need to take out with you in the basket or on the pegboard.  You may also want to  include a message board for reminders.  7.    Use sticky notes.  Place sticky notes with reminders in a place where the task is performed.  For example: " turn off the  stove" placed by the stove, "lock the door" placed on the door at eye level, " take your medications" on  the bathroom mirror or by the place where you normally take your medications.  8.    Use alarms/timers.  Use while cooking to remind yourself to check on food or as a reminder to take your medicine, or as a  reminder to make a call, or as a reminder to perform another task, etc.   Multiple Sclerosis Multiple sclerosis (MS) is a disease of the brain, spinal cord, and optic nerves (central nervous system). It causes the body's disease-fighting (immune) system to destroy the protective covering (myelin sheath) around nerves in the brain. When this happens, signals (nerve impulses) going to and from the brain and spinal cord do not  get sent properly or may not get sent at all. There are several types of MS:  Relapsing-remitting MS. This is the most common type. This causes sudden attacks of symptoms. After an attack, you may recover completely until the next attack, or some symptoms may remain permanently.  Secondary progressive MS. This usually develops after the onset of relapsing-remitting MS. Similar to relapsing-remitting MS, this type also causes sudden attacks of symptoms. Attacks may be less frequent, but symptoms slowly get worse (progress) over time.  Primary progressive MS. This causes symptoms that steadily progress over time. This type of MS does not cause sudden attacks of symptoms. The age of onset of MS varies, but it often develops between 27-56 years of age. MS is a lifelong (chronic) condition. There is no cure, but treatment can help slow down the progression of the disease. What are the causes? The cause of this condition is not known. What increases the risk? You are more likely to develop this condition if:  You are a woman.  You have a relative with MS. However, the condition is not passed from parent to child (inherited).  You have a lack (deficiency) of vitamin D.  You smoke. MS is more common in the [place] than in the [place].  What are the signs or symptoms? Relapsing-remitting and secondary progressive MS cause symptoms to occur in episodes or attacks that may last weeks to months. There may be long periods between attacks in which there are almost no symptoms. Primary progressive MS causes symptoms to steadily progress after they develop. Symptoms of MS vary because of the many different ways it affects the central nervous system. The main symptoms include:  Vision problems and eye pain.  Numbness.  Weakness.  Inability to move your arms, hands, feet, or legs (paralysis).  Balance problems.  Shaking that you cannot control (tremors).  Muscle  spasms.  Problems with thinking (cognitive changes). MS can also cause symptoms that are associated with the disease, but are not always the direct result of an MS attack. They may include:  Inability to control urination or bowel movements (incontinence).  Headaches.  Fatigue.  Inability to tolerate heat.  Emotional changes.  Depression.  Pain. How is this diagnosed? This condition is diagnosed based on:  Your symptoms.  A neurological exam. This involves checking central nervous system function, such as nerve function, reflexes, and coordination.  MRIs of the brain and spinal cord.  Lab tests, including a lumbar puncture that tests the fluid that surrounds the brain and spinal cord (cerebrospinal fluid).  Tests to measure the electrical activity of the brain in response to stimulation (evoked potentials). How is this treated? There is no cure for MS, but medicines can help decrease the number and frequency of attacks and help relieve nuisance symptoms. Treatment options may include:  Medicines that reduce the frequency of attacks. These medicines may be given by injection, by mouth (orally), or through an IV.  Medicines that reduce inflammation (steroids). These may provide short-term relief of symptoms.  Medicines to help control pain, depression, fatigue, or incontinence.  Vitamin D, if you have a deficiency.  Using devices to help you move around (assistive devices), such as braces, a cane, or a walker.  Physical therapy to strengthen and stretch your muscles.  Occupational therapy to help you with everyday tasks.  Alternative or complementary treatments such as exercise, massage, or acupuncture. Follow these instructions at home:  Take over-the-counter and prescription medicines only as told by your health care provider.  Do not drive or use heavy machinery while taking prescription pain medicine.  Use assistive devices as recommended by your physical  therapist or your health care provider.  Exercise as directed by your health care provider.  Return to your normal activities as told by your health care provider. Ask your health care provider what activities are safe for you.  Reach out for support. Share your feelings with friends, family, or a support group.  Keep all follow-up visits as told by your health care provider and therapists. This is important. Where to find more information  National Multiple Sclerosis Society: https://www.nationalmssociety.org Contact a health care provider if:  You feel depressed.  You develop new pain or numbness.  You have tremors.  You have problems with sexual function. Get help right away if:  You develop paralysis.  You develop numbness.  You have problems with your bladder or bowel function.  You develop double vision.  You lose vision in one or both eyes.  You develop suicidal thoughts.  You develop severe confusion. If you ever feel like you may hurt yourself or others, or have thoughts about taking your own life, get help right away. You can go to your nearest emergency department or call:  Your  local emergency services (911 in the [place]  A suicide crisis helpline, such as the [place] at 864 002 1899. This is open 24 hours a day. Summary  Multiple sclerosis (MS) is a disease of the central nervous system that causes the body's immune system to destroy the protective covering (myelin sheath) around nerves in the brain.  There are 3 types of MS: relapsing-remitting, secondary progressive, and primary progressive. Relapsing-remitting and secondary progressive MS cause symptoms to occur in episodes or attacks that may last weeks to months. Primary progressive MS causes symptoms to steadily progress after they develop.  There is no cure for MS, but medicines can help decrease the number and frequency of attacks and help relieve nuisance symptoms.  Treatment may also include physical or occupational therapy.  If you develop numbness, paralysis, vision problems, or other neurological symptoms, get help right away. This information is not intended to replace advice given to you by your health care provider. Make sure you discuss any questions you have with your health care provider. Document Revised: 09/22/2017 Document Reviewed: 12/19/2016 Elsevier Patient Education  2020 [employer].

## 2020-03-03 NOTE — Progress Notes (Signed)
I have reviewed and agreed above plan. 

## 2020-05-22 ENCOUNTER — Ambulatory Visit (INDEPENDENT_AMBULATORY_CARE_PROVIDER_SITE_OTHER): Payer: Medicare Other | Admitting: Family Medicine

## 2020-05-22 ENCOUNTER — Other Ambulatory Visit: Payer: Self-pay

## 2020-05-22 VITALS — BP 106/80 | HR 67 | Temp 95.2°F | Ht 71.0 in | Wt 203.0 lb

## 2020-05-22 DIAGNOSIS — Z1159 Encounter for screening for other viral diseases: Secondary | ICD-10-CM

## 2020-05-22 DIAGNOSIS — Z0000 Encounter for general adult medical examination without abnormal findings: Secondary | ICD-10-CM | POA: Diagnosis not present

## 2020-05-22 DIAGNOSIS — Z13220 Encounter for screening for lipoid disorders: Secondary | ICD-10-CM

## 2020-05-22 DIAGNOSIS — Z125 Encounter for screening for malignant neoplasm of prostate: Secondary | ICD-10-CM | POA: Diagnosis not present

## 2020-05-22 MED ORDER — FLUTICASONE PROPIONATE 50 MCG/ACT NA SUSP: 2.0000 | Freq: Every day | NASAL | 11 refills | Status: DC

## 2020-05-22 NOTE — Progress Notes (Signed)
Subjective:    Patient ID: Jonathon Vazquez, male    DOB: Dec 28, 1970, 49 y.o.   MRN: [id number]  HPI Patient is a 49 year old African-American male who is here today for complete physical exam. Past medical history  Is significant for multiple sclerosis as well as Crohn's colitis on chronic immunosuppressive therapy. In 2015 he underwent partial resection of the colon. Most recent colonoscopy was in 2016 which showed some mild ulcerations around the anastomotic site and some friable lesions. Family history is significant for a grandfather with prostate cancer.   Patient states that he is doing well.  He denies any concerns.  His MS is currently in remission.  He has had his Covid vaccination.  We did discuss the shingles shot today but otherwise his immunizations are up-to-date.  He is due for a colonoscopy this year as they recommended a repeat colonoscopy in 3 to 5 years.  Immunization History  Administered Date(s) Administered  . H1N1 10/30/2008  . Hepatitis A 08/30/2007, 05/05/2015  . IPV 08/30/2007  . Influenza,inj,Quad PF,6+ Mos 09/28/2015, 08/15/2016, 09/22/2017, 09/04/2018, 08/28/2019  . Pneumococcal Conjugate-13 10/03/2016  . Pneumococcal Polysaccharide-23 09/28/2015  . Td 08/30/2007  . Tdap 08/30/2007, 04/30/2018  . Typhoid Parenteral, AKD ([place] [employer]) 08/30/2007, 05/05/2015  . Yellow Fever 08/30/2007   Past Medical History:  Diagnosis Date  . Anal fistula 1996  . Anal stenosis   . Atrial fibrillation ([place])    pt and wife deny this  . Colon stricture ([facility])    descending colon  . Crohn's disease ([place])   . Hx of colonic polyp 10/21/2016  . MRSA (methicillin resistant Staphylococcus aureus)   . Multiple sclerosis ([place])   . Neuromuscular disorder ([place])   . Patent ductus arteriosus   . Regional enteritis of large intestine ([place])   . S/P left hemicolectomy 2015   [facility]-done for stricture  . Stricture of descending colon due to Crohn's disease 02/13/2012   Past Surgical  History:  Procedure Laterality Date  . COLON SURGERY  2015   descending colon stricture   . COLONOSCOPY     multiple   Current Outpatient Medications on File Prior to Visit  Medication Sig Dispense Refill  . acetaminophen (TYLENOL) 325 MG tablet Take 650 mg by mouth every 6 (six) hours as needed for mild pain.     . chlorhexidine (HIBICLENS) 4 % external liquid Apply topically daily as needed. 120 mL 11  . clonazePAM (KLONOPIN) 0.5 MG tablet Take 1 tablet (0.5 mg total) by mouth at bedtime as needed for anxiety. 30 tablet 5  . fluticasone (FLONASE) 50 MCG/ACT nasal spray Place 2 sprays into both nostrils daily. 16 g 6  . natalizumab (TYSABRI) 300 MG/15ML injection Inject into the vein. 1 hour infusion monthly    . mupirocin cream (BACTROBAN) 2 % Apply 1 application topically 2 (two) times daily. (Patient not taking: Reported on 05/22/2020) 15 g 0   No current facility-administered medications on file prior to visit.   Allergies  Allergen Reactions  . Lialda [Mesalamine] Diarrhea    Severe diarrhea   Social History   Socioeconomic History  . Marital status: Married    Spouse name: [name]  . Number of children: 3  . Years of education: 38  . Highest education level: Not on file  Occupational History  . Occupation: disabled  Tobacco Use  . Smoking status: Former Smoker    Packs/day: 1.00    Years: 15.00    Pack years: 15.00  Types: Cigarettes    Quit date: 10/24/2004    Years since quitting: 15.5  . Smokeless tobacco: Never Used  Vaping Use  . Vaping Use: Never used  Substance and Sexual Activity  . Alcohol use: Never  . Drug use: Never  . Sexual activity: Yes  Other Topics Concern  . Not on file  Social History Narrative   Married, 1 son one daughter. 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 Multimedia programmer)   Disabled, previously worked at [employer], disability due to multiple sclerosis   Caffeine one daily   Left handed.   Education- one year of college.   Social Determinants of Health    Financial Resource Strain:   . Difficulty of Paying Living Expenses:   Food Insecurity:   . Worried About Charity fundraiser in the Last Year:   . Arboriculturist in the Last Year:   Transportation Needs:   . Film/video editor (Medical):   Marland Kitchen Lack of Transportation (Non-Medical):   Physical Activity:   . Days of Exercise per Week:   . Minutes of Exercise per Session:   Stress:   . Feeling of Stress :   Social Connections:   . Frequency of Communication with Friends and Family:   . Frequency of Social Gatherings with Friends and Family:   . Attends Religious Services:   . Active Member of Clubs or Organizations:   . Attends Archivist Meetings:   Marland Kitchen Marital Status:   Intimate Partner Violence:   . Fear of Current or Ex-Partner:   . Emotionally Abused:   Marland Kitchen Physically Abused:   . Sexually Abused:    Family History  Problem Relation Age of Onset  . Ulcerative colitis Father   . Heart disease Mother   . Prostate cancer Paternal Grandfather   . Diabetes Paternal Grandmother   . Colon cancer Neg Hx   . Rectal cancer Neg Hx   . Stomach cancer Neg Hx      Review of Systems  All other systems reviewed and are negative.      Objective:   Physical Exam Vitals reviewed.  Constitutional:      General: He is not in acute distress.    Appearance: He is well-developed. He is not diaphoretic.  HENT:     Head: Normocephalic and atraumatic.     Right Ear: External ear normal.     Left Ear: External ear normal.     Nose: Nose normal.     Mouth/Throat:     Pharynx: No oropharyngeal exudate.  Eyes:     General: No scleral icterus.       Right eye: No discharge.        Left eye: No discharge.     Conjunctiva/sclera: Conjunctivae normal.     Pupils: Pupils are equal, round, and reactive to light.  Neck:     Thyroid: No thyromegaly.     Vascular: No JVD.     Trachea: No tracheal deviation.  Cardiovascular:     Rate and Rhythm: Normal rate and regular rhythm.      Heart sounds: Normal heart sounds. No murmur heard.  No friction rub. No gallop.   Pulmonary:     Effort: Pulmonary effort is normal. No respiratory distress.     Breath sounds: Normal breath sounds. No stridor. No wheezing or rales.  Chest:     Chest wall: No tenderness.  Abdominal:     General: Bowel sounds are normal. There is no distension.  Palpations: Abdomen is soft. There is no mass.     Tenderness: There is no abdominal tenderness. There is no guarding or rebound.  Genitourinary:    Penis: Normal.      Testes: Normal. Cremasteric reflex is present.        Right: Mass not present.        Left: Mass not present.  Musculoskeletal:        General: No tenderness. Normal range of motion.     Cervical back: Normal range of motion and neck supple.  Lymphadenopathy:     Cervical: No cervical adenopathy.  Skin:    General: Skin is warm.     Coloration: Skin is not pale.     Findings: No erythema or rash.  Neurological:     Mental Status: He is alert and oriented to person, place, and time.     Cranial Nerves: No cranial nerve deficit.     Motor: No abnormal muscle tone.     Coordination: Coordination normal.     Deep Tendon Reflexes: Reflexes are normal and symmetric.  Psychiatric:        Behavior: Behavior normal.        Thought Content: Thought content normal.        Judgment: Judgment normal.    There are numerous keloids on his shoulders and upper back.  He declines dermatology referral for intralesional corticosteroid injections       Assessment & Plan:  Prostate cancer screening - Plan: PSA, COMPLETE METABOLIC PANEL WITH GFR  Encounter for hepatitis C screening test for low risk patient - Plan: Hepatitis C Antibody  Screening cholesterol level - Plan: COMPLETE METABOLIC PANEL WITH GFR, CBC with Differential/Platelet, Lipid panel  Physical exam today is completely normal.  I recommended the patient return fasting for a CMP, CBC, fasting lipid panel.  We  also discussed hepatitis C screening and he agrees to allow me to check that today.  Also check a PSA to screen for prostate cancer.  Patient will call his gastroenterologist to see about scheduling his colonoscopy as he follows up with them regularly.  He will also check on the price of the shingles vaccine, Shingrix and determine if he wants to receive that now.

## 2020-05-29 ENCOUNTER — Other Ambulatory Visit: Payer: Self-pay

## 2020-05-29 ENCOUNTER — Other Ambulatory Visit: Payer: Medicare Other

## 2020-06-01 ENCOUNTER — Encounter: Payer: Self-pay | Admitting: *Deleted

## 2020-06-01 LAB — CBC WITH DIFFERENTIAL/PLATELET
Absolute Monocytes: 445 cells/uL (ref 200–950)
Basophils Absolute: 43 cells/uL (ref 0–200)
Basophils Relative: 0.7 %
Eosinophils Absolute: 268 cells/uL (ref 15–500)
Eosinophils Relative: 4.4 %
HCT: 42.8 % (ref 38.5–50.0)
Hemoglobin: 14.3 g/dL (ref 13.2–17.1)
Lymphs Abs: 2873 cells/uL (ref 850–3900)
MCH: 28.2 pg (ref 27.0–33.0)
MCHC: 33.4 g/dL (ref 32.0–36.0)
MCV: 84.4 fL (ref 80.0–100.0)
MPV: 10.8 fL (ref 7.5–12.5)
Monocytes Relative: 7.3 %
Neutro Abs: 2471 cells/uL (ref 1500–7800)
Neutrophils Relative %: 40.5 %
Platelets: 153 10*3/uL (ref 140–400)
RBC: 5.07 10*6/uL (ref 4.20–5.80)
RDW: 13.3 % (ref 11.0–15.0)
Total Lymphocyte: 47.1 %
WBC: 6.1 10*3/uL (ref 3.8–10.8)

## 2020-06-01 LAB — COMPLETE METABOLIC PANEL WITH GFR
AG Ratio: 1.5 (calc) (ref 1.0–2.5)
ALT: 13 U/L (ref 9–46)
AST: 17 U/L (ref 10–35)
Albumin: 4.1 g/dL (ref 3.6–5.1)
Alkaline phosphatase (APISO): 72 U/L (ref 35–144)
BUN: 13 mg/dL (ref 7–25)
CO2: 23 mmol/L (ref 20–32)
Calcium: 9.2 mg/dL (ref 8.6–10.3)
Chloride: 103 mmol/L (ref 98–110)
Creat: 1.13 mg/dL (ref 0.70–1.33)
GFR, Est African American: 87 mL/min/{1.73_m2} (ref >=60)
GFR, Est Non African American: 75 mL/min/{1.73_m2} (ref >=60)
Globulin: 2.7 g/dL (calc) (ref 1.9–3.7)
Glucose, Bld: 89 mg/dL (ref 65–99)
Potassium: 4.7 mmol/L (ref 3.5–5.3)
Sodium: 138 mmol/L (ref 135–146)
Total Bilirubin: 1 mg/dL (ref 0.2–1.2)
Total Protein: 6.8 g/dL (ref 6.1–8.1)

## 2020-06-01 LAB — HEPATITIS C ANTIBODY
Hepatitis C Ab: NONREACTIVE
SIGNAL TO CUT-OFF: 0.01 (ref <1.00)

## 2020-06-01 LAB — LIPID PANEL
Cholesterol: 189 mg/dL (ref <200)
HDL: 52 mg/dL (ref >=40)
LDL Cholesterol (Calc): 121 mg/dL (calc) — ABNORMAL HIGH
Non-HDL Cholesterol (Calc): 137 mg/dL (calc) — ABNORMAL HIGH (ref <130)
Total CHOL/HDL Ratio: 3.6 (calc) (ref <5.0)
Triglycerides: 68 mg/dL (ref <150)

## 2020-06-01 LAB — PSA: PSA: 1 ng/mL (ref <=4.0)

## 2020-06-02 ENCOUNTER — Encounter: Payer: Self-pay | Admitting: Family Medicine

## 2020-07-29 ENCOUNTER — Other Ambulatory Visit: Payer: Self-pay | Admitting: Family Medicine

## 2020-07-29 DIAGNOSIS — G35 Multiple sclerosis: Secondary | ICD-10-CM

## 2020-07-29 NOTE — Addendum Note (Signed)
Addended by: Debbora Presto L on: 07/29/2020 03:57 PM   Modules accepted: Orders

## 2020-07-29 NOTE — Progress Notes (Signed)
JCV lab have been sent out for processing.

## 2020-07-29 NOTE — Addendum Note (Signed)
Addended by: Inis Sizer D on: 07/29/2020 04:02 PM   Modules accepted: Orders

## 2020-07-30 LAB — CBC WITH DIFFERENTIAL/PLATELET
Basophils Absolute: 0 10*3/uL (ref 0.0–0.2)
Basos: 1 %
EOS (ABSOLUTE): 0.3 10*3/uL (ref 0.0–0.4)
Eos: 6 %
Hematocrit: 43.4 % (ref 37.5–51.0)
Hemoglobin: 14.3 g/dL (ref 13.0–17.7)
Immature Grans (Abs): 0 10*3/uL (ref 0.0–0.1)
Immature Granulocytes: 1 %
Lymphocytes Absolute: 2.2 10*3/uL (ref 0.7–3.1)
Lymphs: 40 %
MCH: 27.7 pg (ref 26.6–33.0)
MCHC: 32.9 g/dL (ref 31.5–35.7)
MCV: 84 fL (ref 79–97)
Monocytes Absolute: 0.4 10*3/uL (ref 0.1–0.9)
Monocytes: 8 %
NRBC: 1 % — ABNORMAL HIGH (ref 0–0)
Neutrophils Absolute: 2.5 10*3/uL (ref 1.4–7.0)
Neutrophils: 44 %
Platelets: 160 10*3/uL (ref 150–450)
RBC: 5.16 x10E6/uL (ref 4.14–5.80)
RDW: 13.2 % (ref 11.6–15.4)
WBC: 5.5 10*3/uL (ref 3.4–10.8)

## 2020-08-04 LAB — STRATIFY JCV AB (W/ INDEX) W/ RFLX
Index Value: 0.19
Stratify JCV (TM) Ab w/Reflex Inhibition: NEGATIVE

## 2020-08-27 ENCOUNTER — Ambulatory Visit (INDEPENDENT_AMBULATORY_CARE_PROVIDER_SITE_OTHER): Payer: Medicare Other | Admitting: Family Medicine

## 2020-08-27 ENCOUNTER — Encounter: Payer: Self-pay | Admitting: Family Medicine

## 2020-08-27 VITALS — BP 116/66 | HR 73 | Ht 71.0 in | Wt 200.0 lb

## 2020-08-27 DIAGNOSIS — R351 Nocturia: Secondary | ICD-10-CM | POA: Diagnosis not present

## 2020-08-27 DIAGNOSIS — R0683 Snoring: Secondary | ICD-10-CM | POA: Diagnosis not present

## 2020-08-27 DIAGNOSIS — G35 Multiple sclerosis: Secondary | ICD-10-CM

## 2020-08-27 DIAGNOSIS — Z79899 Other long term (current) drug therapy: Secondary | ICD-10-CM | POA: Diagnosis not present

## 2020-08-27 NOTE — Progress Notes (Signed)
Chief Complaint  Patient presents with  . Follow-up    rm 6  . Multiple Sclerosis    pt says his wife said his memory is bad.     HISTORY OF PRESENT ILLNESS: Today 08/27/20  Jonathon Vazquez is a 49 y.o. male here today for follow up for RRMS. Last MRI 07/2019 stable with no new lesions noted.  He continues Tysabri infusions monthly. Labs are stable. He is doing very well from an MS perspective.  No new or exacerbating symptoms.  He walks without difficulty.  No falls.  He does have concerns of memory loss. He has difficulty with short term information and recall. His wife will tell him something and he can't remember it the next day. He is able to perform ADL's independently. He drives without difficulty. He is able to manage his home and finances. He does not sleep well. He is up and down to the bathroom 2-3 times every night. He rarely uses clonazepam. He does snore. He states that his wife listens for him to stop breathing (unsure if he does). He denies family history of sleep apnea. He has an uncle with dementia.    HISTORY (copied from my note on 02/24/2020)  HISTORY OF PRESENT ILLNESS:He was seen by his [facility] since 2000, initially presenting with facial numbness, MRI demonstrates lesions consistent with MS, however he has lost followups, was suggested Copaxone in 2004, but never followed through, he was reevaluated by Dr. Ron Vazquez in February of 2008, he began to have bladder incontinence, dizziness, blurry vision, unbalanced gait, repeat MRI at that time continued to demonstrate multiple foci of signal hyperintensity, involving brainstem, superior peduncle, deep white matters, there was pericallosal involvement, no enhancement, but there was positive DWIs lesion, he was started on Avenox since 06/13/2007, he continued to have flulike illness following injection,  He also reported a history of Crohn's disease, last treatment was in 2004 with [place], he no longer has significant GI symptoms,  He was treated with immunosuppressive treatment mercaptopurine in late 1990s, for 3-4 months, still had diahrrea.  He had one to 2 flareups each year, presenting with increased dizziness, unbalanced gait, blurry vision, worsening memory trouble, often partial improvement with steroid package.  Most recent MRI cervical was in July 2011, remote demyelinating plaque at C2 and C4, no enhancing lesions.  Repeat MRI brain and cervical in Jan 2013 showed a defined hyperintensity in the spinal cord at C2 which likely represents remote age inactive demyelinating plaque. No enhancing lesions are noted. As compared with previous MRI dated 04/30/2010 the C4 spinal cord hyperintensity is no longer seen.  MRI scan of the brain showed multiple brainstem, cerebellar, corpus callosal and periventricular white matter hyperintensities consistent with multiple sclerosis. No enhancing lesions are noted. Presence of T1 black holes andd corpus callosal atrophy indicate chronic disease. Overall no significant changes compared with MRI from 01/23/2007   He is doing very well, there was no new neurological symptoms, he is taking Avonex, mild flulike illness right out of the injection, improved by Tylenol, or aleve. He complains of frequent bilateral frontal headaches.  We have reviewed MRI of the brain,which has demonstratedmultiple periventricular and subcortical and infratentorial chronic demyelinating plaques. No acute plaques. Compared to prior MRI on 01/23/07, there are has been disease progression.  He continues to do very well, reported prior to MRI scan, he has been off of his Avonex for 4 months, due to insurance reasons, after discussed with Jonathon Vazquez we decided  to stay on current Avonex treatment, overall only slight change in his MRI scan there is no significant clinical worsening, we will repeat MRI of the brain and cervical spine with and without contrast in one year.  Colonoscopy in October 9th 2014 has  demonstrated,inflammatory stricture, measuring 10 mm, biopsy showed inflammation, Jonathon Vazquez was considering using Remicade   Jonathon Vazquez positive 0.43 (09/09/2013), indertminate 0.39 Jan 27th 2016, 0.26 negative July 2016  Varicella zoster virus Vazquez was positive  I have discussed with his GI Jonathon Vazquez, he has been treated with prednisone, mercaptopurine in 2011 155m daily for one year, ( which is a nucleoside metabolic inhibitor that results in cell-cycle arrest and death)and mesalamine in the past,  We decided not to proceed with Jonathon Vazquez Keep him on Avonex he complains of 3 weeks history of left arm numbness, since January 2015, has much improved now,   UPDATE 07/28/2014:YY He [facility] which did [facility] him,   He has more memory loss. He also complains of fatigue. No recurrent mass symptoms, he denied gait difficulty, no visual disorder  We have reviewed MRI of brain: scattered bilateral periventricular, subcortical, corpus callosum, cerebellar and brainstem white matter hyperintensities typical for demyelinating disease. No enhancing lesions are noted. The presence of T1 black holes and mild atrophy of corpus callosum indicates chronic disease. Compared with previous MRI scan dated 04/22/13 there appears to be slight disease activity progression   MRI scan of the cervical spine showing left lateral disc herniation at C5-6 with significant narrowing of the foramina. Spinal cord hyperintensities at C2, C4-5 and in the brainstem and cerebellum likely represent remote age demyelinating plaques. No enhancing lesions are noted.  UPDATE Jan 26th 2016:YY He complains of increased short term memory loss, no significant gait difficulty, no vision loss does complains of fatigue, especially with heat exposure We have reviewed most recent MRI of the brain in July 2016 with without contrast, in comparison to MRI of brain in 2008, and 2015,  multiple rounded and ovoid, periventricular, subcortical, juxtacortical and infratentorial chronic demyelinating plaque, some T1 black holes, no abnormal enhancement, no change compared to previous scans  UPDATE Sep 24 2015:YY He came in with his wife, and children at today's clinical visit, I have personally reviewed MRI of the film in 2016, in comparison with 2008, there was mild progression, he does has slow worsening mild gait difficulty, after extensive discussion, we decided proceed with Tysarbri IV infusion, he has history of Crohn's disease, low titer Jonathon Vazquez 0.27 in July 2016, he has used mercaptopurine in 2011 1586mdaily for one year, both patient and his wife understand the potential risk and benefit, less than 10/998 chance of PML  UPDATE Dec 07 2016: He has started [place] since December of 2016, on a monthly basis, overall has been doing very well, most recent MRI was in December 2017, no significant change compared to previous MRI in 2016, He noticed visual trouble since early Feb 2018, he could no longer read clock that he used to read, difficulty focusing, worse on left side. He denies confusion,no headaches, He also complains of new onset insomnia, difficulty falling sleep,   Virtual Visit via VideoApril 20 2020:  He is overall doing very well, he joined the video conference with his wife, has been getting monthly Tysabri treatment, no significant side effect,  His Crohn's disease is under good control since he started on Tysabri  UPDATE Aug 22 2019: He is doing very well, no recurrent neurological spells, Crohn's disease is also under good control with [place].  I personally reviewed MRI of the brain with without contrast inOctober 2020: Multiple T2/flair hyperintensity foci in the brainstem, cerebellum, left thalamus, in a pattern and configuration consistent with chronic demyelinating plaque, no change compared toSeptember 2019, no  contrast-enhancement  UPDATE Feb 24, 2020:  Jonathon Vazquez is a 49 y.o. 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 He is scheduled for his next infusion 5/11. He is doing well. He had recent labs that were normal. Last MRI in 2020 stable. He denies new or exacerbating symptoms. He does report more trouble remembering information. Sometimes he has word finding difficulty. He is completely independent. Driving without difficulty. He is sleeping well. He is eating regular, well balanced meals and tries to stay active.      REVIEW OF SYSTEMS: Out of a complete 14 system review of symptoms, the patient complains only of the following symptoms, memory loss, snoring, and all other reviewed systems are negative.   ALLERGIES: Allergies  Allergen Reactions  . Lialda [Mesalamine] Diarrhea    Severe diarrhea     HOME MEDICATIONS: Outpatient Medications Prior to Visit  Medication Sig Dispense Refill  . chlorhexidine (HIBICLENS) 4 % external liquid Apply topically daily as needed. 120 mL 11  . clonazePAM (KLONOPIN) 0.5 MG tablet Take 1 tablet (0.5 mg total) by mouth at bedtime as needed for anxiety. 30 tablet 5  . fluticasone (FLONASE) 50 MCG/ACT nasal spray Place 2 sprays into both nostrils daily. 16 g 11  . mupirocin cream (BACTROBAN) 2 % Apply 1 application topically 2 (two) times daily. 15 g 0  . natalizumab (TYSABRI) 300 MG/15ML injection Inject into the vein. 1 hour infusion monthly    . acetaminophen (TYLENOL) 325 MG tablet Take 650 mg by mouth every 6 (six) hours as needed for mild pain.      No facility-administered medications prior to visit.     PAST MEDICAL HISTORY: Past Medical History:  Diagnosis Date  . Anal fistula 1996  . Anal stenosis   . Atrial fibrillation ([place])    pt and wife deny this  . Colon stricture (HCC)    descending colon  . Crohn's disease ([place])   . Hx of colonic polyp 10/21/2016  . MRSA (methicillin resistant Staphylococcus aureus)   . Multiple  sclerosis ([place])   . Neuromuscular disorder ([place])   . Patent ductus arteriosus   . Regional enteritis of large intestine ([place])   . S/P left hemicolectomy 2015   [facility]-done for stricture  . 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 COLON SURGERY  2015   descending colon stricture   . COLONOSCOPY     multiple     FAMILY HISTORY: Family History  Problem Relation Age of Onset  . Ulcerative colitis Father   . Heart disease Mother   . Prostate cancer Paternal Grandfather   . Diabetes Paternal Grandmother   . Colon cancer Neg Hx   . Rectal cancer Neg Hx   . Stomach cancer Neg Hx      SOCIAL HISTORY: Social History   Socioeconomic History  . Marital status: Married    Spouse name: [name]  . Number of children: 3  . Years of education: 49  . Highest education level: Not on file  Occupational History  .  Occupation: disabled  Tobacco Use  . Smoking status: Former Smoker    Packs/day: 1.00    Years: 15.00    Pack years: 15.00    Types: Cigarettes    Quit date: 10/24/2004    Years since quitting: 15.8  . Smokeless tobacco: Never Used  Vaping Use  . Vaping Use: Never used  Substance and Sexual Activity  . Alcohol use: Never  . Drug use: Never  . Sexual activity: Yes  Other Topics Concern  . Not on file  Social History Narrative   Married, 1 son one daughter. 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 Multimedia programmer)   Disabled, previously worked at [employer], disability due to multiple sclerosis   Caffeine one daily   Left handed.   Education- one year of college.   Social Determinants of Health   Financial Resource Strain:   . Difficulty of Paying Living Expenses: Not on file  Food Insecurity:   . Worried About Charity fundraiser in the Last Year: Not on file  . Ran Out of Food in the Last Year: Not on file  Transportation Needs:   . Lack of Transportation (Medical): Not on file  . Lack of  Transportation (Non-Medical): Not on file  Physical Activity:   . Days of Exercise per Week: Not on file  . Minutes of Exercise per Session: Not on file  Stress:   . Feeling of Stress : Not on file  Social Connections:   . Frequency of Communication with Friends and Family: Not on file  . Frequency of Social Gatherings with Friends and Family: Not on file  . Attends Religious Services: Not on file  . Active Member of Clubs or Organizations: Not on file  . Attends Archivist Meetings: Not on file  . Marital Status: Not on file  Intimate Partner Violence:   . Fear of Current or Ex-Partner: Not on file  . Emotionally Abused: Not on file  . Physically Abused: Not on file  . Sexually Abused: Not on file      PHYSICAL EXAM  Vitals:   08/27/20 1445  BP: 116/66  Pulse: 73  Weight: 200 lb (90.7 kg)  Height: 5' 11"  (1.803 m)   Body mass index is 27.89 kg/m.   Generalized: Well developed, in no acute distress   Cardiology: Regular rate and rhythm Respiratory clear to auscultation bilaterally  Neurological examination  Mentation: Alert oriented to time, place, history taking. Follows all commands speech and language fluent Cranial nerve II-XII: Pupils were equal round reactive to light. Extraocular movements were full, visual field were full on confrontational test. Facial sensation and strength were normal. Uvula tongue midline. Head turning and shoulder shrug  were normal and symmetric. Motor: The motor testing reveals 5 over 5 strength of all 4 extremities. Good symmetric motor tone is noted throughout.  Sensory: Sensory testing is intact to soft touch on all 4 extremities. No evidence of extinction is noted.  Coordination: Cerebellar testing reveals good finger-nose-finger and heel-to-shin bilaterally.  Gait and station: Gait is normal.     DIAGNOSTIC DATA (LABS, IMAGING, TESTING) - I reviewed patient records, labs, notes, testing and imaging myself where  available.  Lab Results  Component Value Date   WBC 5.5 07/29/2020   HGB 14.3 07/29/2020   HCT 43.4 07/29/2020   MCV 84 07/29/2020   PLT 160 07/29/2020      Component Value Date/Time   NA 138 05/29/2020 0823   NA 138 08/22/2019 1515   K 4.7  05/29/2020 0823   CL 103 05/29/2020 0823   CO2 23 05/29/2020 0823   GLUCOSE 89 05/29/2020 0823   BUN 13 05/29/2020 0823   BUN 12 08/22/2019 1515   CREATININE 1.13 05/29/2020 0823   CALCIUM 9.2 05/29/2020 0823   PROT 6.8 05/29/2020 0823   PROT 6.8 08/22/2019 1515   ALBUMIN 4.2 08/22/2019 1515   AST 17 05/29/2020 0823   ALT 13 05/29/2020 0823   ALKPHOS 81 08/22/2019 1515   BILITOT 1.0 05/29/2020 0823   BILITOT 1.1 08/22/2019 1515   GFRNONAA 75 05/29/2020 0823   GFRAA 87 05/29/2020 0823   Lab Results  Component Value Date   CHOL 189 05/29/2020   HDL 52 05/29/2020   LDLCALC 121 (H) 05/29/2020   TRIG 68 05/29/2020   CHOLHDL 3.6 05/29/2020   No results found for: HGBA1C No results found for: VITAMINB12 Lab Results  Component Value Date   TSH 1.420 08/22/2019      ASSESSMENT AND PLAN  49 y.o. year old male  has a past medical history of Anal fistula (1996), Anal stenosis, Atrial fibrillation ([place]), Colon stricture ([place]), Crohn's disease ([place]), colonic polyp (10/21/2016), MRSA (methicillin resistant Staphylococcus aureus), Multiple sclerosis ([place]), Neuromuscular disorder ([place]), Patent ductus arteriosus, Regional enteritis of large intestine ([place]), S/P left hemicolectomy (2015), and Stricture of descending colon due to Crohn's disease (02/13/2012). here with   Relapsing remitting multiple sclerosis (HCC)  High risk medication use  Snoring - Plan: Ambulatory referral to Sleep Studies  Nocturia - Plan: Ambulatory referral to Sleep Studies   Mr. Patrone is doing very well from an MS perspective. We will continue Tysabri infusions monthly. MRI last in October 2020 was stable. Labs have been normal. He continues to have concerns of  subjective memory loss. Neuro exam intact. We have discussed consideration of formal cognitive evaluation, however, I do not feel that this will lend to concerns of neurodegenerative disease. He is completely independent and able to manage his home and finances. MRI did not show any concerns of atrophy. We have discussed concerns of sleep disruptions. He is up and down multiple times at night to use the bathroom. He does snore and reports that his wife mentions that she listens for him to stop breathing, however, it is unclear if he has apneic events. I do feel that sleep evaluation is warranted. I will refer him to one of our sleep specialist. We have discussed memory compensation strategies. I provided additional educational materials in AVS. Healthy lifestyle habits encouraged. Adequate hydration, well-balanced diet and regular exercise advised. He will follow-up with Dr. Krista Blue in 6 months, sooner if needed. He verbalizes understanding and agreement with this plan.  I spent 20 minutes of face-to-face and non-face-to-face time with patient.  This included previsit chart review, lab review, study review, order entry, electronic health record documentation, patient education.    Debbora Presto, MSN, FNP-C 08/27/2020, 3:22 PM  [facility] [address] [place], [place] [address] [phone]

## 2020-08-27 NOTE — Patient Instructions (Signed)
Below is our plan:  We will continue current plan. We will send you for a sleep study.   Please make sure you are staying well hydrated. I recommend 50-60 ounces daily. Well balanced diet and regular exercise encouraged.    Please continue follow up with care team as directed.   Follow up with Dr Krista Blue in 6 months   You may receive a survey regarding today's visit. I encourage you to leave honest feed back as I do use this information to improve patient care. Thank you for seeing me today!     Memory Compensation Strategies  1. Use "WARM" strategy.  W= write it down  A= associate it  R= repeat it  M= make a mental note  2.   You can keep a Social worker.  Use a 3-ring notebook with sections for the following: calendar, important names and phone numbers,  medications, doctors' names/phone numbers, lists/reminders, and a section to journal what you did  each day.   3.    Use a calendar to write appointments down.  4.    Write yourself a schedule for the day.  This can be placed on the calendar or in a separate section of the Memory Notebook.  Keeping a  regular schedule can help memory.  5.    Use medication organizer with sections for each day or morning/evening pills.  You may need help loading it  6.    Keep a basket, or pegboard by the door.  Place items that you need to take out with you in the basket or on the pegboard.  You may also want to  include a message board for reminders.  7.    Use sticky notes.  Place sticky notes with reminders in a place where the task is performed.  For example: " turn off the  stove" placed by the stove, "lock the door" placed on the door at eye level, " take your medications" on  the bathroom mirror or by the place where you normally take your medications.  8.    Use alarms/timers.  Use while cooking to remind yourself to check on food or as a reminder to take your medicine, or as a  reminder to make a call, or as a reminder to perform  another task, etc.   Multiple Sclerosis Multiple sclerosis (MS) is a disease of the brain, spinal cord, and optic nerves (central nervous system). It causes the body's disease-fighting (immune) system to destroy the protective covering (myelin sheath) around nerves in the brain. When this happens, signals (nerve impulses) going to and from the brain and spinal cord do not get sent properly or may not get sent at all. There are several types of MS:  Relapsing-remitting MS. This is the most common type. This causes sudden attacks of symptoms. After an attack, you may recover completely until the next attack, or some symptoms may remain permanently.  Secondary progressive MS. This usually develops after the onset of relapsing-remitting MS. Similar to relapsing-remitting MS, this type also causes sudden attacks of symptoms. Attacks may be less frequent, but symptoms slowly get worse (progress) over time.  Primary progressive MS. This causes symptoms that steadily progress over time. This type of MS does not cause sudden attacks of symptoms. The age of onset of MS varies, but it often develops between 44-64 years of age. MS is a lifelong (chronic) condition. There is no cure, but treatment can help slow down the progression of the  disease. What are the causes? The cause of this condition is not known. What increases the risk? You are more likely to develop this condition if:  You are a woman.  You have a relative with MS. However, the condition is not passed from parent to child (inherited).  You have a lack (deficiency) of vitamin D.  You smoke. MS is more common in the [place] than in the [place]. What are the signs or symptoms? Relapsing-remitting and secondary progressive MS cause symptoms to occur in episodes or attacks that may last weeks to months. There may be long periods between attacks in which there are almost no symptoms. Primary progressive MS causes  symptoms to steadily progress after they develop. Symptoms of MS vary because of the many different ways it affects the central nervous system. The main symptoms include:  Vision problems and eye pain.  Numbness.  Weakness.  Inability to move your arms, hands, feet, or legs (paralysis).  Balance problems.  Shaking that you cannot control (tremors).  Muscle spasms.  Problems with thinking (cognitive changes). MS can also cause symptoms that are associated with the disease, but are not always the direct result of an MS attack. They may include:  Inability to control urination or bowel movements (incontinence).  Headaches.  Fatigue.  Inability to tolerate heat.  Emotional changes.  Depression.  Pain. How is this diagnosed? This condition is diagnosed based on:  Your symptoms.  A neurological exam. This involves checking central nervous system function, such as nerve function, reflexes, and coordination.  MRIs of the brain and spinal cord.  Lab tests, including a lumbar puncture that tests the fluid that surrounds the brain and spinal cord (cerebrospinal fluid).  Tests to measure the electrical activity of the brain in response to stimulation (evoked potentials). How is this treated? There is no cure for MS, but medicines can help decrease the number and frequency of attacks and help relieve nuisance symptoms. Treatment options may include:  Medicines that reduce the frequency of attacks. These medicines may be given by injection, by mouth (orally), or through an IV.  Medicines that reduce inflammation (steroids). These may provide short-term relief of symptoms.  Medicines to help control pain, depression, fatigue, or incontinence.  Vitamin D, if you have a deficiency.  Using devices to help you move around (assistive devices), such as braces, a cane, or a walker.  Physical therapy to strengthen and stretch your muscles.  Occupational therapy to help you with  everyday tasks.  Alternative or complementary treatments such as exercise, massage, or acupuncture. Follow these instructions at home:  Take over-the-counter and prescription medicines only as told by your health care provider.  Do not drive or use heavy machinery while taking prescription pain medicine.  Use assistive devices as recommended by your physical therapist or your health care provider.  Exercise as directed by your health care provider.  Return to your normal activities as told by your health care provider. Ask your health care provider what activities are safe for you.  Reach out for support. Share your feelings with friends, family, or a support group.  Keep all follow-up visits as told by your health care provider and therapists. This is important. Where to find more information  National Multiple Sclerosis Society: https://www.nationalmssociety.org Contact a health care provider if:  You feel depressed.  You develop new pain or numbness.  You have tremors.  You have problems with sexual function. Get help right away if:  You develop paralysis.  You develop numbness.  You have problems with your bladder or bowel function.  You develop double vision.  You lose vision in one or both eyes.  You develop suicidal thoughts.  You develop severe confusion. If you ever feel like you may hurt yourself or others, or have thoughts about taking your own life, get help right away. You can go to your nearest emergency department or call:  Your local emergency services (911 in the [place]  A suicide crisis helpline, such as the [place] at 820-481-6038. This is open 24 hours a day. Summary  Multiple sclerosis (MS) is a disease of the central nervous system that causes the body's immune system to destroy the protective covering (myelin sheath) around nerves in the brain.  There are 3 types of MS: relapsing-remitting, secondary  progressive, and primary progressive. Relapsing-remitting and secondary progressive MS cause symptoms to occur in episodes or attacks that may last weeks to months. Primary progressive MS causes symptoms to steadily progress after they develop.  There is no cure for MS, but medicines can help decrease the number and frequency of attacks and help relieve nuisance symptoms. Treatment may also include physical or occupational therapy.  If you develop numbness, paralysis, vision problems, or other neurological symptoms, get help right away. This information is not intended to replace advice given to you by your health care provider. Make sure you discuss any questions you have with your health care provider. Document Revised: 09/22/2017 Document Reviewed: 12/19/2016 Elsevier Patient Education  2020 [employer].

## 2020-09-29 ENCOUNTER — Other Ambulatory Visit: Payer: Self-pay | Admitting: Neurology

## 2020-09-29 ENCOUNTER — Encounter: Payer: Self-pay | Admitting: Neurology

## 2020-09-29 ENCOUNTER — Ambulatory Visit (INDEPENDENT_AMBULATORY_CARE_PROVIDER_SITE_OTHER): Payer: Medicare Other | Admitting: Neurology

## 2020-09-29 VITALS — BP 127/67 | HR 74 | Ht 71.0 in | Wt 204.0 lb

## 2020-09-29 DIAGNOSIS — E663 Overweight: Secondary | ICD-10-CM

## 2020-09-29 DIAGNOSIS — G4700 Insomnia, unspecified: Secondary | ICD-10-CM | POA: Diagnosis not present

## 2020-09-29 DIAGNOSIS — R351 Nocturia: Secondary | ICD-10-CM

## 2020-09-29 DIAGNOSIS — R0681 Apnea, not elsewhere classified: Secondary | ICD-10-CM | POA: Diagnosis not present

## 2020-09-29 DIAGNOSIS — G35 Multiple sclerosis: Secondary | ICD-10-CM

## 2020-09-29 DIAGNOSIS — R0683 Snoring: Secondary | ICD-10-CM | POA: Diagnosis not present

## 2020-09-29 NOTE — Patient Instructions (Signed)
It was nice to meet you today.  Here is what we discussed today and what we came up with as our plan for you:    Based on your symptoms and your exam I believe you are at risk for obstructive sleep apnea (aka OSA), and I think we should proceed with a sleep study to determine whether you do or do not have OSA and how severe it is. Even, if you have mild OSA, I may want you to consider treatment with CPAP, as treatment of even borderline or mild sleep apnea can result and improvement of symptoms such as sleep disruption, daytime sleepiness, nighttime bathroom breaks, restless leg symptoms, improvement of headache syndromes, even improved mood disorder.   As explained, an attended sleep study meaning you get to stay overnight in the sleep lab, lets [place] monitor sleep-related behaviors such as sleep talking and leg movements in sleep, in addition to monitoring for sleep apnea.  A home sleep test is a screening tool for sleep apnea only, and unfortunately does not help with any other sleep-related diagnoses.  Please remember, the long-term risks and ramifications of untreated moderate to severe obstructive sleep apnea are: increased Cardiovascular disease, including congestive heart failure, stroke, difficult to control hypertension, treatment resistant obesity, arrhythmias, especially irregular heartbeat commonly known as A. Fib. (atrial fibrillation); even type 2 diabetes has been linked to untreated OSA.   Sleep apnea can cause disruption of sleep and sleep deprivation in most cases, which, in turn, can cause recurrent headaches, problems with memory, mood, concentration, focus, and vigilance. Most people with untreated sleep apnea report excessive daytime sleepiness, which can affect their ability to drive. Please do not drive if you feel sleepy. Patients with sleep apnea can also develop difficulty initiating and maintaining sleep (aka insomnia).   Having sleep apnea may increase your risk for other sleep  disorders, including involuntary behaviors sleep such as sleep terrors, sleep talking, sleepwalking.    Having sleep apnea can also increase your risk for restless leg syndrome and leg movements at night.   Please note that untreated obstructive sleep apnea may carry additional perioperative morbidity. Patients with significant obstructive sleep apnea (typically, in the moderate to severe degree) should receive, if possible, perioperative PAP (positive airway pressure) therapy and the surgeons and particularly the anesthesiologists should be informed of the diagnosis and the severity of the sleep disordered breathing.   I will likely see you back after your sleep study to go over the test results and where to go from there. We will call you after your sleep study to advise about the results (most likely, you will hear from [facility], my nurse) and to set up an appointment at the time, as necessary.    Our sleep lab administrative assistant will call you to schedule your sleep study and give you further instructions, regarding the check in process for the sleep study, arrival time, what to bring, when you can expect to leave after the study, etc., and to answer any other logistical questions you may have. If you don't hear back from her by about 2 weeks from now, please feel free to call her direct line at [phone] or you can call our general clinic number, or email [place] through My Chart.

## 2020-09-29 NOTE — Progress Notes (Signed)
Subjective:    Patient ID: Jonathon Vazquez is a 49 y.o. male.  HPI     Star Age, MD, PhD [facility] [address], [address] [place], [place] [address] Dear Warren Lacy and Aliene Beams,   I saw your patient, Jonathon Vazquez, upon your kind request, in my sleep clinic today for initial consultation of his sleep disorder, in particular, concern for underlying obstructive sleep apnea.  Patient is unaccompanied today.  As you know, [name] is a 49 year old right-handed gentleman with an underlying medical history of Crohn's disease, multiple sclerosis, atrial fibrillation (by chart review), and overweight state, who reports snoring and excessive daytime somnolence as well as witnessed apneas per wife's report.  I reviewed your office note from 08/27/2020.  His Epworth sleepiness score is 6/24, fatigue severity score is 30 out of 63.  He is retired.  He used to work at [employer].  He lives at home with his family which includes wife and 2 children, ages 55 and 71.  They have no pets at the house.  He is not aware of any prior diagnosis of atrial fibrillation.  He does report having had a heart murmur and had seen cardiology many years ago for this.  He was told that his heart murmur will go away eventually.  He has no family history of sleep apnea.  He has difficulty maintaining sleep.  He has taken clonazepam 0.5 mg strength as needed at night.  It helps him sleep at night.  He takes it sparingly, maybe 1 pill every 2 to 3 weeks.  He tries to be in bed between 10 and 11 and rise time is around 630 to help get up his children.  He denies recurrent morning headaches but does have nocturia about 2-3 times per average night.  He is a restless sleeper and does have several nighttime awakenings.  It is difficult for him to stay asleep.  He does not take an over-the-counter sleep aid.  He quit smoking in 2004, does not utilize any alcohol, drinks caffeine in the form of coffee, 1 cup in the  morning and 2 soda cans per day on average.  His Past Medical History Is Significant For: Past Medical History:  Diagnosis Date  . Anal fistula 1996  . Anal stenosis   . Atrial fibrillation ([place])    pt and wife deny this  . Colon stricture ([facility])    descending colon  . Crohn's disease ([place])   . Hx of colonic polyp 10/21/2016  . MRSA (methicillin resistant Staphylococcus aureus)   . Multiple sclerosis ([place])   . Neuromuscular disorder ([place])   . Patent ductus arteriosus   . Regional enteritis of large intestine ([place])   . S/P left hemicolectomy 2015   [facility]-done for stricture  . Stricture of descending colon due to Crohn's disease 02/13/2012    His Past Surgical History Is Significant For: Past Surgical History:  Procedure Laterality Date  . COLON SURGERY  2015   descending colon stricture   . COLONOSCOPY     multiple    His Family History Is Significant For: Family History  Problem Relation Age of Onset  . Ulcerative colitis Father   . Heart disease Mother   . Prostate cancer Paternal Grandfather   . Diabetes Paternal Grandmother   . Colon cancer Neg Hx   . Rectal cancer Neg Hx   . Stomach cancer Neg Hx     His Social History Is Significant For:  Social History   Socioeconomic History  . Marital status: Married    Spouse name: [name]  . Number of children: 3  . Years of education: 12  . Highest education level: Not on file  Occupational History  . Occupation: disabled  Tobacco Use  . Smoking status: Former Smoker    Packs/day: 1.00    Years: 15.00    Pack years: 15.00    Types: Cigarettes    Quit date: 10/24/2004    Years since quitting: 15.9  . Smokeless tobacco: Never Used  Vaping Use  . Vaping Use: Never used  Substance and Sexual Activity  . Alcohol use: Never  . Drug use: Never  . Sexual activity: Yes  Other Topics Concern  . Not on file  Social History Narrative   Married, 1 son one daughter. 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 Multimedia programmer)   Disabled, previously  worked at [employer], disability due to multiple sclerosis   Caffeine one daily   Left handed.   Education- one year of college.   Social Determinants of Health   Financial Resource Strain:   . Difficulty of Paying Living Expenses: Not on file  Food Insecurity:   . Worried About Charity fundraiser in the Last Year: Not on file  . Ran Out of Food in the Last Year: Not on file  Transportation Needs:   . Lack of Transportation (Medical): Not on file  . Lack of Transportation (Non-Medical): Not on file  Physical Activity:   . Days of Exercise per Week: Not on file  . Minutes of Exercise per Session: Not on file  Stress:   . Feeling of Stress : Not on file  Social Connections:   . Frequency of Communication with Friends and Family: Not on file  . Frequency of Social Gatherings with Friends and Family: Not on file  . Attends Religious Services: Not on file  . Active Member of Clubs or Organizations: Not on file  . Attends Archivist Meetings: Not on file  . Marital Status: Not on file    His Allergies Are:  Allergies  Allergen Reactions  . Lialda [Mesalamine] Diarrhea    Severe diarrhea  :   His Current Medications Are:  Outpatient Encounter Medications as of 09/29/2020  Medication Sig  . chlorhexidine (HIBICLENS) 4 % external liquid Apply topically daily as needed.  . clonazePAM (KLONOPIN) 0.5 MG tablet Take 1 tablet (0.5 mg total) by mouth at bedtime as needed for anxiety.  . fluticasone (FLONASE) 50 MCG/ACT nasal spray Place 2 sprays into both nostrils daily.  . mupirocin cream (BACTROBAN) 2 % Apply 1 application topically 2 (two) times daily.  . natalizumab (TYSABRI) 300 MG/15ML injection Inject into the vein. 1 hour infusion monthly   No facility-administered encounter medications on file as of 09/29/2020.  :  Review of Systems:  Out of a complete 14 point review of systems, all are reviewed and negative with the exception of these symptoms as listed  below: Review of Systems  Neurological:       Pt presents today to discuss his sleep. Pt has never had a sleep study but does endorse snoring.  Epworth Sleepiness Scale 0= would never doze 1= slight chance of dozing 2= moderate chance of dozing 3= high chance of dozing  Sitting and reading: 2 Watching TV: 1 Sitting inactive in a public place (ex. Theater or meeting): 1 As a passenger in a car for an hour without a break: 0 Lying down to rest  in the afternoon: 1 Sitting and talking to someone: 0 Sitting quietly after lunch (no alcohol): 1 In a car, while stopped in traffic: 0 Total: 6     Objective:  Neurological Exam  Physical Exam Physical Examination:   Vitals:   09/29/20 1504  BP: 127/67  Pulse: 74    General Examination: The patient is a very pleasant 49 y.o. male in no acute distress. He appears well-developed and well-nourished and well groomed.   HEENT: Normocephalic, atraumatic, pupils are equal, round and reactive to light, extraocular tracking is good without limitation to gaze excursion or nystagmus noted. Hearing is grossly intact. Face is symmetric with normal facial animation. Speech is clear with no dysarthria noted. There is no hypophonia. There is no lip, neck/head, jaw or voice tremor. Neck is supple with full range of passive and active motion. There are no carotid bruits on auscultation. Oropharynx exam reveals: mild mouth dryness, adequate dental hygiene and mild airway crowding, due to redundant soft palate. Mallampati is class II. Tongue protrudes centrally and palate elevates symmetrically. Tonsils are smaller. Neck size is 16.75 inches. He has a Moderate overbite.  Chest: Clear to auscultation without wheezing, rhonchi or crackles noted.  Heart: S1+S2+0, regular and normal without murmurs, rubs or gallops noted.   Abdomen: Soft, non-tender and non-distended with normal bowel sounds appreciated on auscultation.  Extremities: There is no pitting  edema in the distal lower extremities bilaterally.   Skin: Warm and dry without trophic changes noted.   Musculoskeletal: exam reveals no obvious joint deformities, tenderness or joint swelling or erythema.   Neurologically:  Mental status: The patient is awake, alert and oriented in all 4 spheres. His immediate and remote memory, attention, language skills and fund of knowledge are appropriate. There is no evidence of aphasia, agnosia, apraxia or anomia. Speech is clear with normal prosody and enunciation. Thought process is linear. Mood is normal and affect is normal.  Cranial nerves II - XII are as described above under HEENT exam.  Motor exam: Normal bulk, strength and tone is noted. There is no tremor, Romberg is negative. Fine motor skills and coordination: grossly intact.  Cerebellar testing: No dysmetria or intention tremor. There is no truncal or gait ataxia.  Sensory exam: intact to light touch in the upper and lower extremities.  Gait, station and balance: He stands easily. No veering to one side is noted. No leaning to one side is noted. Posture is age-appropriate and stance is narrow based. Gait shows normal stride length and normal pace. No problems turning are noted. Tandem walk is difficult for him.    Assessment and Plan:    In summary, Kiley Torrence Mato is a very pleasant 49 y.o.-year old male with an underlying medical history of Crohn's disease, multiple sclerosis, atrial fibrillation (by chart review), and overweight state, whose history and physical exam are concerning for obstructive sleep apnea (OSA). I had a long chat with the patient about my findings and the diagnosis of OSA, its prognosis and treatment options. We talked about medical treatments, surgical interventions and non-pharmacological approaches. I explained in particular the risks and ramifications of untreated moderate to severe OSA, especially with respect to developing cardiovascular disease down the Road,  including congestive heart failure, difficult to treat hypertension, cardiac arrhythmias, or stroke. Even type 2 diabetes has, in part, been linked to untreated OSA. Symptoms of untreated OSA include daytime sleepiness, memory problems, mood irritability and mood disorder such as depression and anxiety, lack of energy, as  well as recurrent headaches, especially morning headaches. We talked about trying to maintain a healthy lifestyle in general, as well as the importance of weight control. We also talked about the importance of good sleep hygiene. I recommended the following at this time: sleep study.  I explained the sleep test procedure to the patient and also outlined possible surgical and non-surgical treatment options of OSA, including the use of a custom-made dental device (which would require a referral to a specialist dentist or oral surgeon), upper airway surgical options, such as traditional UPPP or a novel less invasive surgical option in the form of Inspire hypoglossal nerve stimulation (which would involve a referral to an ENT surgeon). I also explained the CPAP treatment option to the patient, who indicated that he would be willing to try CPAP if the need arises. I explained the importance of being compliant with PAP treatment, not only for insurance purposes but primarily to improve His symptoms, and for the patient's long term health benefit, including to reduce His cardiovascular risks. I answered all his questions today and the patient was in agreement. I plan to see him back after the sleep study is completed and encouraged him to call with any interim questions, concerns, problems or updates.   Thank you very much for allowing me to participate in the care of this nice patient. If I can be of any further assistance to you please do not hesitate to talk to me.  Sincerely,   Star Age, MD, PhD

## 2020-11-03 ENCOUNTER — Ambulatory Visit (INDEPENDENT_AMBULATORY_CARE_PROVIDER_SITE_OTHER): Payer: Medicare Other | Admitting: Neurology

## 2020-11-03 DIAGNOSIS — G35 Multiple sclerosis: Secondary | ICD-10-CM

## 2020-11-03 DIAGNOSIS — G4700 Insomnia, unspecified: Secondary | ICD-10-CM

## 2020-11-03 DIAGNOSIS — G4720 Circadian rhythm sleep disorder, unspecified type: Secondary | ICD-10-CM

## 2020-11-03 DIAGNOSIS — E663 Overweight: Secondary | ICD-10-CM

## 2020-11-03 DIAGNOSIS — R9431 Abnormal electrocardiogram [ECG] [EKG]: Secondary | ICD-10-CM

## 2020-11-03 DIAGNOSIS — G4761 Periodic limb movement disorder: Secondary | ICD-10-CM

## 2020-11-03 DIAGNOSIS — R0683 Snoring: Secondary | ICD-10-CM

## 2020-11-03 DIAGNOSIS — R0681 Apnea, not elsewhere classified: Secondary | ICD-10-CM

## 2020-11-03 DIAGNOSIS — R351 Nocturia: Secondary | ICD-10-CM

## 2020-11-10 NOTE — Procedures (Signed)
PATIENT'S NAME:  Jonathon Vazquez, Jonathon Vazquez DOB:      [birth year]      MR#:    [id number]     DATE OF RECORDING: 11/03/2020 REFERRING M.D.:  Dr. Krista Blue  Study Performed:   Baseline Polysomnogram HISTORY: 50 year old man with a history of Crohn's disease, multiple sclerosis, atrial fibrillation (by chart review), and overweight state, who reports snoring and excessive daytime somnolence as well as witnessed apneas per wife's report. The patient's weight 204 pounds with a height of 71 (inches), resulting in a BMI of 28.7 kg/m2. The patient's neck circumference measured 16.8 inches.  CURRENT MEDICATIONS: Chlorhexidine, Klonopin, Flonase, Bactroban, Natalizumab   PROCEDURE:  This is a multichannel digital polysomnogram utilizing the Somnostar 11.2 system.  Electrodes and sensors were applied and monitored per AASM Specifications.   EEG, EOG, Chin and Limb EMG, were sampled at 200 Hz.  ECG, Snore and Nasal Pressure, Thermal Airflow, Respiratory Effort, CPAP Flow and Pressure, Oximetry was sampled at 50 Hz. Digital video and audio were recorded.      BASELINE STUDY  Lights Out was at 21:54 and Lights On at 04:58.  Total recording time (TRT) was 424 minutes, with a total sleep time (TST) of 387 minutes.   The patient's sleep latency was 3 minutes.  REM latency was 97 minutes, which is normal. The sleep efficiency was 91.3 %.     SLEEP ARCHITECTURE: WASO (Wake after sleep onset) was 34 minutes with minimal to mild sleep fragmentation noted. There were 20 minutes in Stage N1, 240 minutes Stage N2, 56.5 minutes Stage N3 and 70.5 minutes in Stage REM.  The percentage of Stage N1 was 5.2%, Stage N2 was 62.%, which is mildly increased, Stage N3 was 14.6% and Stage R (REM sleep) was 18.2%, which is near-normal. The arousals were noted as: 21 were spontaneous, 39 were associated with PLMs, 0 were associated with respiratory events.  RESPIRATORY ANALYSIS:  There were a total of 0 respiratory events:  0 obstructive apneas, 0  central apneas and 0 mixed apneas with a total of 0 apneas and an apnea index (AI) of 0 /hour. There were 0 hypopneas with a hypopnea index of 0 /hour. The patient also had 0 respiratory event related arousals (RERAs).      The total APNEA/HYPOPNEA INDEX (AHI) was 0/hour and the total RESPIRATORY DISTURBANCE INDEX was  0 /hour.  0 events occurred in REM sleep and 0 events in NREM. The REM AHI was  0 /hour, versus a non-REM AHI of 0. The patient spent 187 minutes of total sleep time in the supine position and 200 minutes in non-supine.. The supine AHI was 0.0 versus a non-supine AHI of 0.0.  OXYGEN SATURATION & C02:  The Wake baseline 02 saturation was 93%, with the lowest being 91%. Time spent below 89% saturation equaled 0 minutes.   PERIODIC LIMB MOVEMENTS: The patient had a total of 426 Periodic Limb Movements.  The Periodic Limb Movement (PLM) index was 66. and the PLM Arousal index was 6./hour.  Audio and video analysis did not show any abnormal or unusual movements, behaviors, phonations or vocalizations. The patient took one bathroom break. Mild to moderate snoring was noted. The EKG was in keeping with normal sinus rhythm (NSR) with occasional to frequent PVCs noted.   Post-study, the patient indicated that sleep was the same as usual.   IMPRESSION:  1. Periodic Limb Movement Disorder (PLMD) 2. Primary Snoring 3. Dysfunctions associated with sleep stages or arousal from sleep 4. Non-specific  abnormal EKG  RECOMMENDATIONS:  1. This study does not demonstrate any significant obstructive or central sleep disordered breathing, with the exception of snoring. Weight loss and avoiding the supine sleep position may alleviate his snoring.  2. Severe PLMs (periodic limb movements of sleep) were noted during this study with minimal arousals; clinical correlation is recommended. The mere presence of PLMs does not warrant treatment with medications such as dopamine agonists.  3. The study showed  occasional and at times frequent PVCs on single lead EKG; clinical correlation is recommended and consultation with cardiology may be feasible.  4. This study shows some sleep fragmentation and mildly abnormal sleep stage percentages; these are nonspecific findings and per se do not signify an intrinsic sleep disorder or a cause for the patient's sleep-related symptoms. Causes include (but are not limited to) the first night effect of the sleep study, circadian rhythm disturbances, medication effect or an underlying mood disorder or medical problem.  5. The patient should be cautioned not to drive, work at heights, or operate dangerous or heavy equipment when tired or sleepy. Review and reiteration of good sleep hygiene measures should be pursued with any patient. 6. The patient will be advised to follow up with the referring provider, who will be notified of the test results.  I certify that I have reviewed the entire raw data recording prior to the issuance of this report in accordance with the Standards of Accreditation of the American Academy of Sleep Medicine (AASM)   Star Age, MD, PhD Diplomat, American Board of Neurology and Sleep Medicine (Neurology and Sleep Medicine)

## 2020-11-11 ENCOUNTER — Telehealth: Payer: Self-pay

## 2020-11-11 NOTE — Telephone Encounter (Signed)
I called the pt and we reviewed his sleep study. Pt verbalized understanding and had no questions or concerns. He will f/u with Dr. Krista Blue and or Amy and with PCP to discuss PVC's.  I have sen the report to Dr. Dennard Schaumann to review. Pt will call back as needed and be seen as needed in the sleep clinic.

## 2020-11-11 NOTE — Telephone Encounter (Signed)
-----   Message from Star Age, MD sent at 11/10/2020  4:51 PM EST ----- Patient referred by Dr. Krista Blue and Amy, seen by me on 09/29/20, diagnostic PSG on 11/03/20.   Please call and notify the patient that the recent sleep study did not show any significant obstructive sleep apnea, with the exception of snoring. Weight loss and avoiding the supine sleep position may alleviate his snoring. He had frequent leg twitching, which we call PLMs (periodic limb movements of sleep); they did not cause significant disturbance to his sleep pattern. If he has or develops symptoms of restless legs, he can discuss treatment options with Dr. Krista Blue or Amy. The mere presence of PLMs does not warrant treatment with medications.  He had some extra beats on the EKG; we call these PVCs (premature ventricular contractions).  These are typically not a sign of a dangerous underlying heart arrhythmia.  A full EKG would help to see how frequent these extra beats are.  I would recommend he discuss getting a full heart tracing/EKG through his PCP.  He can also discuss evaluation through a cardiologist with his primary care physician.   This juncture, he can see Dr. Krista Blue and Amy as scheduled, and follow-up in sleep clinic as needed. Thanks,  Star Age, MD, PhD [facility])

## 2021-01-26 ENCOUNTER — Other Ambulatory Visit: Payer: Self-pay | Admitting: Family Medicine

## 2021-01-26 ENCOUNTER — Telehealth: Payer: Self-pay | Admitting: *Deleted

## 2021-01-26 DIAGNOSIS — G35 Multiple sclerosis: Secondary | ICD-10-CM

## 2021-01-26 NOTE — Addendum Note (Signed)
Addended by: Inis Sizer D on: 01/26/2021 04:24 PM   Modules accepted: Orders

## 2021-01-26 NOTE — Telephone Encounter (Signed)
Pt here for lab draw.  JCV specimen to outside box for pick up.

## 2021-01-27 LAB — CBC WITH DIFFERENTIAL/PLATELET
Basophils Absolute: 0 10*3/uL (ref 0.0–0.2)
Basos: 1 %
EOS (ABSOLUTE): 0.3 10*3/uL (ref 0.0–0.4)
Eos: 5 %
Hematocrit: 39.3 % (ref 37.5–51.0)
Hemoglobin: 13.6 g/dL (ref 13.0–17.7)
Immature Grans (Abs): 0 10*3/uL (ref 0.0–0.1)
Immature Granulocytes: 1 %
Lymphocytes Absolute: 2.8 10*3/uL (ref 0.7–3.1)
Lymphs: 44 %
MCH: 28.5 pg (ref 26.6–33.0)
MCHC: 34.6 g/dL (ref 31.5–35.7)
MCV: 82 fL (ref 79–97)
Monocytes Absolute: 0.4 10*3/uL (ref 0.1–0.9)
Monocytes: 7 %
NRBC: 1 % — ABNORMAL HIGH (ref 0–0)
Neutrophils Absolute: 2.6 10*3/uL (ref 1.4–7.0)
Neutrophils: 42 %
Platelets: 165 10*3/uL (ref 150–450)
RBC: 4.78 x10E6/uL (ref 4.14–5.80)
RDW: 12.9 % (ref 11.6–15.4)
WBC: 6.2 10*3/uL (ref 3.4–10.8)

## 2021-02-02 LAB — STRATIFY JCV AB (W/ INDEX) W/ RFLX
Index Value: 0.28 — ABNORMAL HIGH
Stratify JCV (TM) Ab w/Reflex Inhibition: UNDETERMINED — AB

## 2021-02-02 LAB — RFLX STRATIFY JCV (TM) AB INHIBITION: JCV Antibody by Inhibition: NEGATIVE

## 2021-02-03 ENCOUNTER — Telehealth: Payer: Self-pay | Admitting: *Deleted

## 2021-02-03 NOTE — Telephone Encounter (Signed)
JCV result index 0.28 Inhibition assay NEGATIVE.

## 2021-02-25 ENCOUNTER — Ambulatory Visit: Payer: Medicare Other | Admitting: Neurology

## 2021-02-25 ENCOUNTER — Encounter: Payer: Self-pay | Admitting: Neurology

## 2021-02-25 VITALS — BP 128/80 | HR 78 | Ht 71.0 in | Wt 207.0 lb

## 2021-02-25 DIAGNOSIS — G35 Multiple sclerosis: Secondary | ICD-10-CM | POA: Diagnosis not present

## 2021-02-25 NOTE — Progress Notes (Signed)
Chief Complaint  Patient presents with  . Multiple Sclerosis    6 month FU  "Tysabri yesterday, infusions going well"   ASSESSMENT AND PLAN  50 y.o. year old male   Relapsing remitting multiple sclerosis Crohn's disease  Started Tysarbri treatment since December 2016,  , symptoms has been stable, normal flareup, he also reported feeling a boost following his monthly infusion.  Most recent MRI of the brain with without contrast was in September 2020, round, ovoid periventricular, juxtacortical, subcortical and cerebellar chronic demyelinating plaque, no contrast-enhancement Difficulty sleeping  Clonazepam has been very helpful,  Sleep study in 2021 showed no evidence of obstructive sleep apnea   DIAGNOSTIC DATA (LABS, IMAGING, TESTING) - I reviewed patient records, labs, notes, testing and imaging myself where available.   JCV result index 0.28 Inhibition assay NEGATIVE.    PHYSICAL EXAM  Vitals:   02/25/21 1446  BP: 128/80  Pulse: 78  Weight: 207 lb (93.9 kg)  Height: 5' 11"  (1.803 m)   Body mass index is 28.87 kg/m.   Generalized: Well developed, in no acute distress   Cardiology: Regular rate and rhythm Respiratory clear to auscultation bilaterally  Neurological examination  Mentation: Alert oriented to time, place, history taking. Follows all commands speech and language fluent Cranial nerve II-XII: Pupils were equal round reactive to light. Extraocular movements were full, visual field were full on confrontational test. Facial sensation and strength were normal. Uvula tongue midline. Head turning and shoulder shrug  were normal and symmetric. Motor: The motor testing reveals 5 over 5 strength of all 4 extremities. Good symmetric motor tone is noted throughout.  Sensory: Sensory testing is intact to soft touch on all 4 extremities. No evidence of extinction is noted.  Coordination: Cerebellar testing reveals good finger-nose-finger and heel-to-shin bilaterally.   Gait and station: Gait is steady, difficulty performing tandem walking.     HISTORY OF PRESENT ILLNESS: He was seen by his [facility] since 2000, initially presenting with facial numbness, MRI demonstrates lesions consistent with MS, however he has lost followups, was suggested Copaxone in 2004, but never followed through, he was reevaluated by Dr. Ron Parker in February of 2008, he began to have bladder incontinence, dizziness, blurry vision, unbalanced gait, repeat MRI at that time continued to demonstrate multiple foci of signal hyperintensity, involving brainstem, superior peduncle, deep white matters, there was pericallosal involvement, no enhancement, but there was positive DWIs lesion, he was started on Avenox since 06/13/2007, he continued to have flulike illness following injection,  He also reported a history of Crohn's disease, last treatment was in 2004 with [place], he no longer has significant GI symptoms, He was treated with immunosuppressive treatment mercaptopurine in late 1990s, for 3-4 months, still had diahrrea.  He had one to 2 flareups each year, presenting with increased dizziness, unbalanced gait, blurry vision, worsening memory trouble, often partial improvement with steroid package.  Most recent MRI cervical was in July 2011, remote demyelinating plaque at C2 and C4, no enhancing lesions.  Repeat MRI brain and cervical in Jan 2013 showed a defined hyperintensity in the spinal cord at C2 which likely represents remote age inactive demyelinating plaque. No enhancing lesions are noted. As compared with previous MRI dated 04/30/2010 the C4 spinal cord hyperintensity is no longer seen.  MRI scan of the brain showed multiple brainstem, cerebellar, corpus callosal and periventricular white matter hyperintensities consistent with multiple sclerosis. No enhancing lesions are noted. Presence of T1 black holes andd corpus callosal atrophy indicate chronic disease.  Overall no significant  changes compared with MRI from 01/23/2007   He is doing very well, there was no new neurological symptoms, he is taking Avonex, mild flulike illness right out of the injection, improved by Tylenol, or aleve. He complains of frequent bilateral frontal headaches.  We have reviewed MRI of the brain,which has demonstratedmultiple periventricular and subcortical and infratentorial chronic demyelinating plaques. No acute plaques. Compared to prior MRI on 01/23/07, there are has been disease progression.  He continues to do very well, reported prior to MRI scan, he has been off of his Avonex for 4 months, due to insurance reasons, after discussed with Mr. Goodie we decided to stay on current Avonex treatment, overall only slight change in his MRI scan there is no significant clinical worsening, we will repeat MRI of the brain and cervical spine with and without contrast in one year.  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 Carlean Purl, he has been treated with prednisone, mercaptopurine in 2011 186m daily for one year, ( which is a nucleoside metabolic inhibitor that results in cell-cycle arrest and death)and mesalamine in the past,  We decided not to proceed with [place] Keep him on Avonex he complains of 3 weeks history of left arm numbness, since January 2015, has much improved now,   UPDATE 07/28/2014:YY [facility] did [facility] him,   He has more memory loss. He also complains of fatigue. No recurrent mass symptoms, he denied gait difficulty, no visual disorder  We have reviewed MRI of brain: scattered bilateral periventricular, subcortical,  corpus callosum, cerebellar and brainstem white matter hyperintensities typical for demyelinating disease. No enhancing lesions are noted. The presence of T1 black holes and mild atrophy of corpus callosum indicates chronic disease. Compared with previous MRI scan dated 04/22/13 there appears to be slight disease activity progression   MRI scan of the cervical spine showing left lateral disc herniation at C5-6 with significant narrowing of the foramina. Spinal cord hyperintensities at C2, C4-5 and in the brainstem and cerebellum likely represent remote age demyelinating plaques. No enhancing lesions are noted.  UPDATE Jan 26th 2016:YY He complains of increased short term memory loss, no significant gait difficulty, no vision loss does complains of fatigue, especially with heat exposure We have reviewed most recent MRI of the brain in July 2016 with without contrast, in comparison to MRI of brain in 2008, and 2015, multiple rounded and ovoid, periventricular, subcortical, juxtacortical and infratentorial chronic demyelinating plaque, some T1 black holes, no abnormal enhancement, no change compared to previous scans  UPDATE Sep 24 2015:YY He came in with his wife, and children at today's clinical visit, I have personally reviewed MRI of the film in 2016, in comparison with 2008, there was mild progression, he does has slow worsening mild gait difficulty, after extensive discussion, we decided proceed with Tysarbri IV infusion, he has history of Crohn's disease, low titer JC virus antibody 0.27 in July 2016, he has used mercaptopurine in 2011 1582mdaily for one year, both patient and his wife understand the potential risk and benefit, less than 10/998 chance of PML  UPDATE Dec 07 2016: He has started [place] since December of 2016, on a monthly  basis, overall has been doing very well, most recent MRI was in December 2017, no significant change compared to previous MRI in 2016, He noticed  visual trouble since early Feb 2018, he could no longer read clock that he used to read, difficulty focusing, worse on left side. He denies confusion,no headaches, He also complains of new onset insomnia, difficulty falling sleep,   UPDATE Aug 22 2019: He is doing very well, no recurrent neurological spells, Crohn's disease is also under good control with [place].  I personally reviewed MRI of the brain with without contrast inOctober 2020: Multiple T2/flair hyperintensity foci in the brainstem, cerebellum, left thalamus, in a pattern and configuration consistent with chronic demyelinating plaque, no change compared toSeptember 2019, no contrast-enhancement    UPDATE Feb 25 2021: He is tolerating Tysabri infusion very well, no significant side effect noted, noticed around at the end of infusion cycle, JC virus was negative, wants to continue on the infusion,  He has occasionally difficulty sleeping, taking clonazepam as needed, about couple times each month,   REVIEW OF SYSTEMS: Out of a complete 14 system review of symptoms, the patient complains only of the following symptoms, memory loss, snoring, and all other reviewed systems are negative.   ALLERGIES: Allergies  Allergen Reactions  . Lialda [Mesalamine] Diarrhea    Severe diarrhea     HOME MEDICATIONS: Outpatient Medications Prior to Visit  Medication Sig Dispense Refill  . chlorhexidine (HIBICLENS) 4 % external liquid Apply topically daily as needed. 120 mL 11  . clonazePAM (KLONOPIN) 0.5 MG tablet TAKE 1 TABLET BY MOUTH AT BEDTIME AS NEEDED FOR ANXIETY. 30 tablet 2  . fluticasone (FLONASE) 50 MCG/ACT nasal spray Place 2 sprays into both nostrils daily. 16 g 11  . mupirocin cream (BACTROBAN) 2 % Apply 1 application topically 2 (two) times daily. 15 g 0  . natalizumab (TYSABRI) 300 MG/15ML injection Inject into the vein. 1 hour infusion monthly     No facility-administered medications prior to visit.     PAST  MEDICAL HISTORY: Past Medical History:  Diagnosis Date  . Anal fistula 1996  . Anal stenosis   . Atrial fibrillation ([place])    pt and wife deny this  . Colon stricture ([facility])    descending colon  . Crohn's disease ([place])   . Hx of colonic polyp 10/21/2016  . MRSA (methicillin resistant Staphylococcus aureus)   . Multiple sclerosis ([place])   . Neuromuscular disorder ([place])   . Patent ductus arteriosus   . Regional enteritis of large intestine ([place])   . S/P left hemicolectomy 2015   [facility]-done for stricture  . 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 COLON SURGERY  2015   descending colon stricture   . COLONOSCOPY     multiple     FAMILY HISTORY: Family History  Problem Relation Age of Onset  . Ulcerative colitis Father   . Heart disease Mother   . Prostate cancer Paternal Grandfather   . Diabetes Paternal Grandmother   . Colon cancer Neg Hx   . Rectal cancer Neg Hx   . Stomach cancer Neg Hx      SOCIAL HISTORY: Social History   Socioeconomic History  . Marital status: Married    Spouse name: [name]  . Number of children: 3  . Years of education: 63  . Highest education level: Not on file  Occupational History  .  Occupation: disabled  Tobacco Use  . Smoking status: Former Smoker    Packs/day: 1.00    Years: 15.00    Pack years: 15.00    Types: Cigarettes    Quit date: 10/24/2004    Years since quitting: 16.3  . Smokeless tobacco: Never Used  Vaping Use  . Vaping Use: Never used  Substance and Sexual Activity  . Alcohol use: Never  . Drug use: Never  . Sexual activity: Yes  Other Topics Concern  . Not on file  Social History Narrative   Married, 1 son one daughter. 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 Multimedia programmer)   Disabled, previously worked at [employer], disability due to multiple sclerosis   Caffeine one daily   Left handed.   Education- one year of college.   Social  Determinants of Health   Financial Resource Strain: Not on file  Food Insecurity: Not on file  Transportation Needs: Not on file  Physical Activity: Not on file  Stress: Not on file  Social Connections: Not on file  Intimate Partner Violence: Not on file

## 2021-03-02 ENCOUNTER — Telehealth: Payer: Self-pay | Admitting: Neurology

## 2021-03-02 NOTE — Telephone Encounter (Signed)
[employer] medicare pending faxed notes to [employer].

## 2021-03-08 NOTE — Telephone Encounter (Signed)
Received approval from [facility]. [place] #[id number] (03/04/21- 05/03/21). Sent order to GI.

## 2021-03-24 ENCOUNTER — Other Ambulatory Visit: Payer: Self-pay

## 2021-03-24 ENCOUNTER — Ambulatory Visit
Admission: RE | Admit: 2021-03-24 | Discharge: 2021-03-24 | Disposition: A | Discharge status: Home / Self Care | Payer: Medicare Other | Source: Ambulatory Visit | Attending: Neurology | Admitting: Neurology

## 2021-03-24 DIAGNOSIS — G35 Multiple sclerosis: Secondary | ICD-10-CM

## 2021-03-24 MED ORDER — GADOBENATE DIMEGLUMINE 529 MG/ML IV SOLN
19.0000 mL | Freq: Once | INTRAVENOUS | Status: AC | PRN
  Administered 2021-03-24: 19 mL via INTRAVENOUS

## 2021-06-22 ENCOUNTER — Other Ambulatory Visit: Payer: Self-pay | Admitting: Family Medicine

## 2021-07-29 ENCOUNTER — Other Ambulatory Visit: Payer: Self-pay

## 2021-07-29 ENCOUNTER — Ambulatory Visit (AMBULATORY_SURGERY_CENTER): Payer: Medicare Other

## 2021-07-29 VITALS — Ht 71.0 in | Wt 205.0 lb

## 2021-07-29 DIAGNOSIS — K50112 Crohn's disease of large intestine with intestinal obstruction: Secondary | ICD-10-CM

## 2021-07-29 NOTE — Progress Notes (Signed)
No allergies to soy or egg Pt is not on blood thinners or diet pills Denies issues with sedation/intubation Denies atrial flutter/fib Denies constipation   Pt is aware of Covid safety and care partner requirements.

## 2021-07-30 ENCOUNTER — Encounter: Payer: Self-pay | Admitting: Internal Medicine

## 2021-08-12 ENCOUNTER — Encounter: Payer: Self-pay | Admitting: Internal Medicine

## 2021-08-12 ENCOUNTER — Ambulatory Visit (AMBULATORY_SURGERY_CENTER): Payer: Medicare Other | Admitting: Internal Medicine

## 2021-08-12 ENCOUNTER — Other Ambulatory Visit: Payer: Self-pay

## 2021-08-12 VITALS — BP 112/68 | HR 50 | Temp 96.8°F | Resp 13 | Ht 71.0 in | Wt 205.0 lb

## 2021-08-12 DIAGNOSIS — K50112 Crohn's disease of large intestine with intestinal obstruction: Secondary | ICD-10-CM

## 2021-08-12 MED ORDER — SODIUM CHLORIDE 0.9 % IV SOLN
500.0000 mL | Freq: Once | INTRAVENOUS | Status: DC
Start: 2021-08-12 — End: 2021-08-12

## 2021-08-12 NOTE — Progress Notes (Signed)
Called to room to assist during endoscopic procedure.  Patient ID and intended procedure confirmed with present staff. Received instructions for my participation in the procedure from the performing physician.  

## 2021-08-12 NOTE — Progress Notes (Signed)
VS-DT  Pt's states no medical or surgical changes since previsit or office visit.

## 2021-08-12 NOTE — Progress Notes (Signed)
[facility] History and Physical   Primary Care Physician:  Susy Frizzle, MD   Reason for Procedure:   Crohn's colitis  Plan:    colonoscopy     HPI: Jonathon Vazquez is a 50 y.o. male w/ hx Crohn's colitis and a cecal colon polyp. Last colonoscopy 2017.   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 Authorizing Provider  celecoxib (CELEBREX) 200 MG capsule Take 200 mg by mouth daily. 07/19/21  Yes [provider]  clonazePAM (KLONOPIN) 0.5 MG tablet TAKE 1 TABLET BY MOUTH AT BEDTIME AS NEEDED FOR ANXIETY. 09/30/20  Yes Marcial Pacas, MD  fluticasone (FLONASE) 50 MCG/ACT nasal spray SPRAY 2 SPRAYS INTO EACH NOSTRIL EVERY DAY 06/23/21  Yes Susy Frizzle, MD  acetaminophen (TYLENOL) 325 MG tablet Take by mouth.    [provider]  chlorhexidine (HIBICLENS) 4 % external liquid Apply topically daily as needed. Patient not taking: No sig reported 06/28/18   Susy Frizzle, MD  MULTIPLE VITAMIN PO Take by mouth.    [provider]  mupirocin cream (BACTROBAN) 2 % Apply 1 application topically 2 (two) times daily. Patient not taking: No sig reported 06/28/18   Susy Frizzle, MD  natalizumab (TYSABRI) 300 MG/15ML injection Inject into  the vein. 1 hour infusion monthly    [provider]    Current Outpatient Medications  Medication Sig Dispense Refill   celecoxib (CELEBREX) 200 MG capsule Take 200 mg by mouth daily.     clonazePAM (KLONOPIN) 0.5 MG tablet TAKE 1 TABLET BY MOUTH AT BEDTIME AS NEEDED FOR ANXIETY. 30 tablet 2   fluticasone (FLONASE) 50 MCG/ACT nasal spray SPRAY 2 SPRAYS INTO EACH NOSTRIL EVERY DAY 48 mL 3   acetaminophen (TYLENOL) 325 MG tablet Take by mouth.     chlorhexidine (HIBICLENS) 4 % external liquid Apply topically daily as needed. (Patient not taking: No sig reported) 120 mL 11   MULTIPLE VITAMIN PO Take by mouth.     mupirocin cream (BACTROBAN) 2 % Apply 1 application topically 2 (two) times daily. (Patient not taking: No sig reported) 15 g 0   natalizumab (TYSABRI) 300 MG/15ML injection Inject into the vein. 1 hour infusion monthly     Current Facility-Administered Medications  Medication Dose Route Frequency Provider Last Rate Last Admin   0.9 %  sodium chloride infusion  500 mL Intravenous Once Gatha Mayer, MD        Allergies as of 08/12/2021 - Review Complete 08/12/2021  Allergen Reaction Noted   Lialda [mesalamine] Diarrhea 09/25/2014    Family History  Problem Relation Age of Onset   Heart  disease Mother    Ulcerative colitis Father    Diabetes Paternal Grandmother    Prostate cancer Paternal Grandfather    Colon cancer Neg Hx    Rectal cancer Neg Hx    Stomach cancer Neg Hx    Colon polyps Neg Hx    Esophageal cancer Neg Hx     Social History   Socioeconomic History   Marital status: Married    Spouse name: [name]   Number of children: 3   Years of education: 49   Highest education level: Not on file  Occupational History   Occupation: disabled  Tobacco Use   Smoking status: Former    Packs/day: 1.00    Years: 15.00    Pack years: 15.00    Types: Cigarettes    Quit date: 10/24/2004    Years since quitting: 16.8   Smokeless tobacco: Never  Vaping  Use   Vaping Use: Never used  Substance and Sexual Activity   Alcohol use: Never   Drug use: Never   Sexual activity: Yes  Other Topics Concern   Not on file  Social History Narrative   Married, 1 son one daughter. 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 Multimedia programmer)   Disabled, previously worked at [employer], disability due to multiple sclerosis   Caffeine one daily   Left handed.   Education- one year of college.      Review of Systems:  All other review of systems negative except as mentioned in the HPI.  Physical Exam: Vital signs BP (!) 96/56   Pulse 60   Temp (!) 96.8 F (36 C) (Temporal)   Ht 5' 11"  (1.803 m)   Wt 205 lb (93 kg)   SpO2 99%   BMI 28.59 kg/m   General:   Alert,  Well-developed, well-nourished, pleasant and cooperative in NAD Lungs:  Clear throughout to auscultation.   Heart:  Regular rate and rhythm; no murmurs, clicks, rubs,  or gallops. Abdomen:  Soft, nontender and nondistended. Normal bowel sounds.   Neuro/Psych:  Alert and cooperative. Normal mood and affect. A and O x 3   @Danali Marinos  Simonne Maffucci, MD, [facility] [phone] (pager) 08/12/2021 11:17 AM@

## 2021-08-12 NOTE — Op Note (Signed)
[facility] Patient Name: Jonathon Vazquez Procedure Date: 08/12/2021 11:12 AM MRN: [id number] Endoscopist: Gatha Mayer , MD Age: 50 Referring MD:  Date of Birth: 25-Apr-1971 Gender: Male Account #: [id number] Procedure:                Colonoscopy Indications:              High risk colon cancer surveillance: Crohn's                            colitis of 8 (or more) years duration with                            one-third (or more) of the colon involved Medicines:                Propofol per Anesthesia, Monitored Anesthesia Care Procedure:                Pre-Anesthesia Assessment:                           - Prior to the procedure, a History and Physical                            was performed, and patient medications and                            allergies were reviewed. The patient's tolerance of                            previous anesthesia was also reviewed. The risks                            and benefits of the procedure and the sedation                            options and risks were discussed with the patient.                            All questions were answered, and informed consent                            was obtained. Prior Anticoagulants: The patient has                            taken no previous anticoagulant or antiplatelet                            agents. ASA Grade Assessment: II - A patient with                            mild systemic disease. After reviewing the risks                            and benefits, the patient was deemed in  satisfactory condition to undergo the procedure.                           After obtaining informed consent, the colonoscope                            was passed under direct vision. Throughout the                            procedure, the patient's blood pressure, pulse, and                            oxygen saturations were monitored continuously. The                            Olympus  PCF-H190DL (#[id number]) Colonoscope was                            introduced through the anus and advanced to the the                            terminal ileum, with identification of the                            appendiceal orifice and IC valve. The colonoscopy                            was performed without difficulty. The patient                            tolerated the procedure well. The quality of the                            bowel preparation was good. The terminal ileum,                            ileocecal valve, appendiceal orifice, and rectum                            were photographed. Scope In: 11:27:22 AM Scope Out: 11:35:42 AM Scope Withdrawal Time: 0 hours 7 minutes 9 seconds  Total Procedure Duration: 0 hours 8 minutes 20 seconds  Findings:                 The perianal and digital rectal examinations were                            normal.                           There was evidence of a prior end-to-side                            colo-colonic anastomosis in the proximal sigmoid  colon. This was patent and was characterized by                            healthy appearing mucosa.                           The exam was otherwise without abnormality on                            direct and retroflexion views.                           Biopsies for histology were taken with a cold                            forceps from the entire colon for evaluation of                            microscopic colitis. Complications:            No immediate complications. Estimated Blood Loss:     Estimated blood loss was minimal. Impression:               - Patent end-to-side colo-colonic anastomosis,                            characterized by healthy appearing mucosa. No                            evidence of actice Crohn's disease.                           - The examination was otherwise normal on direct                            and retroflexion  views.                           - Biopsies were taken with a cold forceps from the                            entire colon for evaluation of microscopic colitis                            - i.e. active Crohn's or dysplasia. 3 bottles. Recommendation:           - Patient has a contact number available for                            emergencies. The signs and symptoms of potential                            delayed complications were discussed with the                            patient. Return to normal activities  tomorrow.                            Written discharge instructions were provided to the                            patient.                           - Resume previous diet.                           - Continue present medications.                           - Repeat colonoscopy is recommended. The                            colonoscopy date will be determined after pathology                            results from today's exam become available for                            review. Gatha Mayer, MD 08/12/2021 11:44:46 AM This report has been signed electronically.

## 2021-08-12 NOTE — Patient Instructions (Addendum)
Everything looks normal. I did did take biopsies as that is recommended.  Glad you are well.  I will let you know results and plans.  I appreciate the opportunity to care for you. Gatha Mayer, MD, FACG  YOU HAD AN ENDOSCOPIC PROCEDURE TODAY AT [facility]:   Refer to the procedure report that was given to you for any specific questions about what was found during the examination.  If the procedure report does not answer your questions, please call your gastroenterologist to clarify.  If you requested that your care partner not be given the details of your procedure findings, then the procedure report has been included in a sealed envelope for you to review at your convenience later.  YOU SHOULD EXPECT: Some feelings of bloating in the abdomen. Passage of more gas than usual.  Walking can help get rid of the air that was put into your GI tract during the procedure and reduce the bloating. If you had a lower endoscopy (such as a colonoscopy or flexible sigmoidoscopy) you may notice spotting of blood in your stool or on the toilet paper. If you underwent a bowel prep for your procedure, you may not have a normal bowel movement for a few days.  Please Note:  You might notice some irritation and congestion in your nose or some drainage.  This is from the oxygen used during your procedure.  There is no need for concern and it should clear up in a day or so.  SYMPTOMS TO REPORT IMMEDIATELY:  Following lower endoscopy (colonoscopy or flexible sigmoidoscopy):  Excessive amounts of blood in the stool  Significant tenderness or worsening of abdominal pains  Swelling of the abdomen that is new, acute  Fever of 100F or higher  For urgent or emergent issues, a gastroenterologist can be reached at any hour by calling [phone]. Do not use MyChart messaging for urgent concerns.    DIET:  We do recommend a small meal at first, but then you may proceed to your regular diet.  Drink  plenty of fluids but you should avoid alcoholic beverages for 24 hours.  ACTIVITY:  You should plan to take it easy for the rest of today and you should NOT DRIVE or use heavy machinery until tomorrow (because of the sedation medicines used during the test).    FOLLOW UP: Our staff will call the number listed on your records 48-72 hours following your procedure to check on you and address any questions or concerns that you may have regarding the information given to you following your procedure. If we do not reach you, we will leave a message.  We will attempt to reach you two times.  During this call, we will ask if you have developed any symptoms of COVID 19. If you develop any symptoms (ie: fever, flu-like symptoms, shortness of breath, cough etc.) before then, please call [phone].  If you test positive for Covid 19 in the 2 weeks post procedure, please call and report this information to [place].    If any biopsies were taken you will be contacted by phone or by letter within the next 1-3 weeks.  Please call us at [phone] if you have not heard about the biopsies in 3 weeks.    SIGNATURES/CONFIDENTIALITY: You and/or your care partner have signed paperwork which will be entered into your electronic medical record.  These signatures attest to the fact that that the information above on your After Visit Summary has  been reviewed and is understood.  Full responsibility of the confidentiality of this discharge information lies with you and/or your care-partner.

## 2021-08-16 ENCOUNTER — Telehealth: Payer: Self-pay

## 2021-08-16 NOTE — Telephone Encounter (Signed)
  Follow up Call-  Call back number 08/12/2021  Post procedure Call Back phone  # [phone]  Permission to leave phone message Yes  Some recent data might be hidden     Patient questions:  Do you have a fever, pain , or abdominal swelling? No. Pain Score  0 *  Have you tolerated food without any problems? Yes.    Have you been able to return to your normal activities? Yes.    Do you have any questions about your discharge instructions: Diet   No. Medications  No. Follow up visit  No.  Do you have questions or concerns about your Care? No.  Actions: * If pain score is 4 or above: No action needed, pain <4.  Have you developed a fever since your procedure? no  2.   Have you had an respiratory symptoms (SOB or cough) since your procedure? no  3.   Have you tested positive for COVID 19 since your procedure no  4.   Have you had any family members/close contacts diagnosed with the COVID 19 since your procedure?  no   If yes to any of these questions please route to Joylene John, RN and Joella Prince, RN

## 2021-08-18 ENCOUNTER — Telehealth: Payer: Self-pay | Admitting: *Deleted

## 2021-08-18 ENCOUNTER — Other Ambulatory Visit: Payer: Self-pay | Admitting: *Deleted

## 2021-08-18 ENCOUNTER — Other Ambulatory Visit (INDEPENDENT_AMBULATORY_CARE_PROVIDER_SITE_OTHER): Payer: Self-pay

## 2021-08-18 DIAGNOSIS — G35 Multiple sclerosis: Secondary | ICD-10-CM

## 2021-08-18 DIAGNOSIS — Z79899 Other long term (current) drug therapy: Secondary | ICD-10-CM

## 2021-08-18 DIAGNOSIS — Z0289 Encounter for other administrative examinations: Secondary | ICD-10-CM

## 2021-08-18 NOTE — Telephone Encounter (Signed)
Placed [facility] lab in [facility] lock box for routine lab pick up. Results pending. 

## 2021-08-19 LAB — CBC WITH DIFFERENTIAL/PLATELET
Basophils Absolute: 0.1 10*3/uL (ref 0.0–0.2)
Basos: 1 %
EOS (ABSOLUTE): 0.4 10*3/uL (ref 0.0–0.4)
Eos: 5 %
Hematocrit: 38.8 % (ref 37.5–51.0)
Hemoglobin: 13.5 g/dL (ref 13.0–17.7)
Immature Grans (Abs): 0 10*3/uL (ref 0.0–0.1)
Immature Granulocytes: 1 %
Lymphocytes Absolute: 2.6 10*3/uL (ref 0.7–3.1)
Lymphs: 39 %
MCH: 27.9 pg (ref 26.6–33.0)
MCHC: 34.8 g/dL (ref 31.5–35.7)
MCV: 80 fL (ref 79–97)
Monocytes Absolute: 0.5 10*3/uL (ref 0.1–0.9)
Monocytes: 7 %
Neutrophils Absolute: 3.2 10*3/uL (ref 1.4–7.0)
Neutrophils: 47 %
Platelets: 156 10*3/uL (ref 150–450)
RBC: 4.84 x10E6/uL (ref 4.14–5.80)
RDW: 12.6 % (ref 11.6–15.4)
WBC: 6.7 10*3/uL (ref 3.4–10.8)

## 2021-08-22 ENCOUNTER — Encounter: Payer: Self-pay | Admitting: Internal Medicine

## 2021-08-30 NOTE — Telephone Encounter (Signed)
Results received from [facility].  [facility] antibody: 0.25 Indeterminate  Inhibition Assay: negative

## 2021-09-09 ENCOUNTER — Ambulatory Visit: Payer: Medicare Other | Admitting: Neurology

## 2021-09-09 ENCOUNTER — Encounter: Payer: Self-pay | Admitting: Neurology

## 2021-09-09 VITALS — BP 114/71 | HR 79 | Ht 71.0 in | Wt 207.5 lb

## 2021-09-09 DIAGNOSIS — G35 Multiple sclerosis: Secondary | ICD-10-CM | POA: Diagnosis not present

## 2021-09-09 DIAGNOSIS — Z79899 Other long term (current) drug therapy: Secondary | ICD-10-CM

## 2021-09-09 DIAGNOSIS — K5000 Crohn's disease of small intestine without complications: Secondary | ICD-10-CM | POA: Diagnosis not present

## 2021-09-09 MED ORDER — GABAPENTIN 300 MG PO CAPS: 600.0000 mg | ORAL_CAPSULE | Freq: Three times a day (TID) | ORAL | 11 refills | Status: DC | PRN

## 2021-09-09 NOTE — Progress Notes (Signed)
Chief Complaint  Patient presents with   Follow-up    Room 14 with his wife, [name]. MS follow up. Continued Tysabri. No new concerns today.   ASSESSMENT AND PLAN  50 y.o. year old male   Relapsing remitting multiple sclerosis Crohn's disease  Started Tysarbri treatment since December 2016, both GI and MS symptoms have been stable since, no flareup,   Patient is question whether he should stretch Tysabri fusion from every 4 weeks to every 6 weeks, reviewed the data with him, negative JC virus antibody, he is overall at low risk for PML, and since Tysabri was started, it has helped both MS symptoms and GI symptoms, from MS standpoint, he does has moderate lesion load involving the brain, spinal cord,  I would suggest him to stay on every 4 weeks course, continue to monitor to avoid the long-term side effect Difficulty sleeping  Clonazepam 0.5 mg nightly as needed has been very helpful,  Sleep study in 2021 showed no evidence of obstructive sleep apnea New onset left wrist discomfort, finger paresthesia,  Most suggestive of carpal tunnel syndrome,  Proceed with EMG nerve conduction study  Suggested wrist splint  Gabapentin 300 mg up to 2 tablets every night as needed   DIAGNOSTIC DATA (LABS, IMAGING, TESTING) - I reviewed patient records, labs, notes, testing and imaging myself where available.  JC virus antibody in October 2022 JCV antibody: 0.25 Indeterminate  Inhibition Assay: negative MRI brain (with and without) in June 2022 -Multiple supratentorial and infratentorial chronic demyelinating plaques. -No acute plaques.  No significant change from 07/24/2019.  MRI of cervical spine with without contrast in July 2015  Abnormal MRI scan of the cervical spine showing left lateral  disc herniation at C5-6 with significant narrowing of the foramina. Spinal  cord hyperintensities at C2, C4-5 and in the brainstem and cerebellum  likely represent remote age demyelinating  plaques. No enhancing lesions  are noted.   Started Tysabri infusion since December 2016    HISTORY OF PRESENT ILLNESS: He was seen by his [facility] since 2000, initially presenting with facial numbness, MRI demonstrates lesions consistent with MS, however he has lost followups, was suggested Copaxone in 2004, but never followed through, he was reevaluated by Dr. Ron Parker in February of 2008, he began to have bladder incontinence, dizziness, blurry vision, unbalanced gait, repeat MRI at that time continued to demonstrate multiple foci of signal hyperintensity, involving brainstem, superior peduncle, deep white matters, there was pericallosal involvement, no enhancement, but there was positive DWIs lesion, he was started on Avenox since 06/13/2007,  he continued to have flulike illness following injection,   He also reported a history of Crohn's disease, last treatment was in 2004 with [place], he no longer has significant GI symptoms, He was treated with immunosuppressive treatment mercaptopurine in late 1990s, for 3-4 months, still had diahrrea.   He had one to 2 flareups each year,  presenting with increased dizziness, unbalanced gait, blurry vision, worsening memory trouble, often partial improvement with steroid package.   Most recent MRI cervical was in July 2011, remote demyelinating plaque at C2 and C4, no enhancing lesions.   Repeat MRI brain and cervical in Jan 2013 showed a defined hyperintensity in the spinal cord at C2 which likely represents remote age inactive demyelinating plaque. No enhancing lesions are noted. As compared with previous MRI dated 04/30/2010 the C4 spinal cord hyperintensity is no longer seen.   MRI scan of the brain showed multiple brainstem, cerebellar, corpus callosal and  periventricular white matter hyperintensities consistent with multiple sclerosis. No enhancing lesions are noted. Presence of T1 black holes  andd corpus callosal atrophy indicate chronic disease. Overall no  significant changes compared with MRI from 01/23/2007    He is doing very well, there was no new neurological symptoms, he is taking Avonex, mild flulike illness right out of the injection, improved by Tylenol, or aleve.  He complains of frequent bilateral frontal headaches.   We have reviewed MRI of the brain, which has demonstrated multiple periventricular and subcortical and infratentorial chronic demyelinating plaques. No acute plaques. Compared to prior MRI on 01/23/07, there are has been disease progression.   He continues to do very well, reported prior to MRI scan, he has been off of his Avonex for 4 months, due to insurance reasons, after discussed with Mr. Rozario we decided to stay on current Avonex treatment, overall only slight change in his MRI scan there is no significant clinical worsening, we will repeat MRI of the brain and cervical spine with and without contrast in one year.   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 Carlean Purl, he has been treated with prednisone, mercaptopurine in 2011 131m daily for one year, ( which is a nucleoside metabolic inhibitor that results in cell-cycle arrest and death) and mesalamine in the past,   We decided not to proceed with [place]  Keep him on Avonex he complains of 3 weeks history of left arm numbness, since January 2015, has much improved now,    UPDATE 07/28/2014:YY He [facility],    He has more memory loss. He also complains of fatigue. No recurrent mass symptoms, he denied gait difficulty, no visual disorder   We have reviewed MRI of brain: scattered bilateral periventricular,  subcortical, corpus callosum, cerebellar and brainstem white matter hyperintensities typical for demyelinating disease. No enhancing lesions are noted. The presence of T1 black holes and mild atrophy of corpus callosum indicates chronic disease. Compared with previous MRI scan dated 04/22/13 there appears to be slight disease activity progression    MRI scan of the cervical spine showing left lateral disc herniation at C5-6 with significant narrowing of the foramina. Spinal cord hyperintensities at C2, C4-5 and in the brainstem and cerebellum likely represent remote age demyelinating plaques. No enhancing lesions are noted.   UPDATE Jan 26th 2016:YY He complains of increased short term memory loss, no significant gait difficulty, no vision loss does complains of fatigue, especially with heat exposure We have reviewed most recent MRI of the brain in July 2016 with without contrast, in comparison to MRI of brain in 2008, and 2015, multiple rounded and ovoid, periventricular, subcortical, juxtacortical and infratentorial chronic demyelinating plaque, some T1 black holes, no abnormal enhancement, no change compared to previous scans   UPDATE Sep 24 2015:YY He came in with his wife, and children at today's clinical visit, I have personally reviewed MRI of the film in 2016, in comparison with 2008, there was mild progression, he does has slow worsening mild gait difficulty, after extensive discussion, we decided proceed with Tysarbri IV infusion, he has history of Crohn's disease, low titer JC virus antibody 0.27 in July  2016, he has used mercaptopurine in 2011 171m daily for one year, both patient and his wife understand the potential risk and benefit, less than 10/998 chance of PML   UPDATE Dec 07 2016: He has started [name] since December of 2016, on a monthly basis, overall has been doing very well, most recent MRI was in December 2017, no significant change compared to previous MRI in 2016,  He  noticed visual trouble since early Feb 2018, he could no longer read clock that he used to read, difficulty focusing,  worse on left side.  He denies confusion, no headaches,  He also complains of new onset insomnia, difficulty falling sleep,    UPDATE Aug 22 2019: He is doing very well, no recurrent neurological spells, Crohn's disease is also under good control with [place]   I personally reviewed MRI of the brain with without contrast in October 2020: Multiple T2/flair hyperintensity foci in the brainstem, cerebellum, left thalamus, in a pattern and configuration consistent with chronic demyelinating plaque, no change compared to September 2019, no contrast-enhancement     UPDATE Feb 25 2021: He is tolerating Tysabri infusion very well, no significant side effect noted, noticed around at the end of infusion cycle, JC virus was negative, wants to continue on the infusion,  He has occasionally difficulty sleeping, taking clonazepam as needed, about couple times each month,  UPDATE Sep 09 2021: Left hand paresthesia since march 2022, he is left-hand-dominant, also left wrist discomfort, denies significant neck pain, no flareup of MS symptoms noted  Personally reviewed MRI of brain with without contrast March 24, 2021: Multiple supratentorium and infratentorium chronic demyelinating plaque, no change from September 2020  MRI of cervical spine with without contrast July 2015: Left lateral disc herniation at C5-6 with significant narrowing of the foraminal, spinal cord hyperintensity at C2, 4 5 and in the brainstem cerebellum, no contrast-enhancement  JC virus antibody in October 2022 JCV antibody: 0.25 Indeterminate  Inhibition Assay: negative  PHYSICAL EXAM  Vitals:   02/25/21 1446  BP: 128/80  Pulse: 78  Weight: 207 lb (93.9 kg)  Height: 5' 11"  (1.803 m)   Body mass index is 28.87 kg/m.  PHYSICAL EXAMNIATION:  Gen: NAD, conversant, well nourised, well groomed                      Cardiovascular: Regular rate rhythm, no peripheral edema, warm, nontender. Eyes: Conjunctivae clear without exudates or hemorrhage Neck: Supple, no carotid bruits. Pulmonary: Clear to auscultation bilaterally   NEUROLOGICAL EXAM:  MENTAL STATUS: Speech/Cognition: Awake, alert, normal speech, oriented to history taking and casual conversation.  CRANIAL NERVES: CN II: Visual fields are full to confrontation.  Pupils are round equal and briskly reactive to light. CN III, IV, VI: extraocular movement are normal. No ptosis. CN V: Facial sensation is intact to light touch. CN VII: Face is symmetric with normal eye closure and smile. CN VIII: Hearing is normal to casual conversation CN IX, X: Palate elevates symmetrically. Phonation is normal. CN XI: Head turning and shoulder shrug are intact  MOTOR: Muscle bulk and tone are normal. Muscle strength is normal.  REFLEXES: Reflexes are 2  and symmetric at the biceps, triceps, knees and ankles. Plantar responses are flexor.  SENSORY: Intact to light touch, pinprick, positional and vibratory sensation at fingers and toes.  Left wrist Tinel sign was positive  COORDINATION: There is no trunk or limb ataxia.    GAIT/STANCE: Posture is normal. Gait is steady  with normal steps, base, arm swing and turning.    REVIEW OF SYSTEMS: Out of a complete 14 system review of symptoms, the patient complains only of the following symptoms, memory loss, snoring, and all other reviewed systems are negative.   ALLERGIES: Allergies  Allergen Reactions   Lialda [Mesalamine] Diarrhea    Severe diarrhea     HOME MEDICATIONS: Outpatient Medications Prior to Visit  Medication Sig Dispense Refill   acetaminophen (TYLENOL) 325 MG tablet Take by mouth.     celecoxib (CELEBREX) 200 MG capsule Take 200 mg by mouth daily.     chlorhexidine (HIBICLENS) 4 % external liquid Apply topically daily as needed. 120 mL 11   clonazePAM (KLONOPIN) 0.5 MG tablet  TAKE 1 TABLET BY MOUTH AT BEDTIME AS NEEDED FOR ANXIETY. 30 tablet 2   fluticasone (FLONASE) 50 MCG/ACT nasal spray SPRAY 2 SPRAYS INTO EACH NOSTRIL EVERY DAY 48 mL 3   MULTIPLE VITAMIN PO Take by mouth.     natalizumab (TYSABRI) 300 MG/15ML injection Inject into the vein. 1 hour infusion monthly     mupirocin cream (BACTROBAN) 2 % Apply 1 application topically 2 (two) times daily. 15 g 0   No facility-administered medications prior to visit.     PAST MEDICAL HISTORY: Past Medical History:  Diagnosis Date   Anal fistula 1996   Anal stenosis    Atrial fibrillation (HCC)    pt and wife deny this   Colon stricture ([place])    descending colon   Crohn's disease ([place])    Hx of colonic polyp 10/21/2016   MRSA (methicillin resistant Staphylococcus aureus)    Multiple sclerosis (HCC)    Neuromuscular disorder ([place])    Patent ductus arteriosus    Regional enteritis of large intestine (HCC)    S/P left hemicolectomy 2015   [facility]-done for stricture   Stricture of descending colon due to Crohn's disease 02/13/2012     PAST SURGICAL HISTORY: Past Surgical History:  Procedure Laterality Date   COLON SURGERY  2015   descending colon stricture    COLONOSCOPY     multiple     FAMILY HISTORY: Family History  Problem Relation Age of Onset   Heart disease Mother    Ulcerative colitis Father    Diabetes Paternal Grandmother    Prostate cancer Paternal Grandfather    Colon cancer Neg Hx    Rectal cancer Neg Hx    Stomach cancer Neg Hx    Colon polyps Neg Hx    Esophageal cancer Neg Hx      SOCIAL HISTORY: Social History   Socioeconomic History   Marital status: Married    Spouse name: [name]   Number of children: 3   Years of education: 13   Highest education level: Not on file  Occupational History   Occupation: disabled  Tobacco Use   Smoking status: Former    Packs/day: 1.00    Years: 15.00    Pack years: 15.00    Types: Cigarettes    Quit date: 10/24/2004    Years  since quitting: 16.8   Smokeless tobacco: Never  Vaping Use   Vaping Use: Never used  Substance and Sexual Activity   Alcohol use: Never   Drug use: Never   Sexual activity: Yes  Other Topics Concern   Not on file  Social History Narrative   Married, 1 son one daughter. 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 Multimedia programmer)   Disabled, previously worked at [employer], disability due to multiple sclerosis  Caffeine one daily   Left handed.   Education- one year of college.   Social Determinants of Health   Financial Resource Strain: Not on file  Food Insecurity: Not on file  Transportation Needs: Not on file  Physical Activity: Not on file  Stress: Not on file  Social Connections: Not on file  Intimate Partner Violence: Not on file

## 2021-09-15 ENCOUNTER — Ambulatory Visit (INDEPENDENT_AMBULATORY_CARE_PROVIDER_SITE_OTHER): Payer: Medicare Other | Admitting: Neurology

## 2021-09-15 ENCOUNTER — Other Ambulatory Visit: Payer: Self-pay

## 2021-09-15 DIAGNOSIS — Z79899 Other long term (current) drug therapy: Secondary | ICD-10-CM

## 2021-09-15 DIAGNOSIS — K5000 Crohn's disease of small intestine without complications: Secondary | ICD-10-CM | POA: Diagnosis not present

## 2021-09-15 DIAGNOSIS — G35 Multiple sclerosis: Secondary | ICD-10-CM | POA: Diagnosis not present

## 2021-09-15 DIAGNOSIS — M25532 Pain in left wrist: Secondary | ICD-10-CM | POA: Insufficient documentation

## 2021-09-15 NOTE — Procedures (Signed)
Full Name: Jonathon Vazquez Gender: Male MRN #: [id number] Date of Birth: 12-30-1970    Visit Date: 09/15/2021 08:15 Age: 50 Years Examining Physician: Marcial Pacas, MD  Referring Physician: Marcial Pacas, MD  History: 50 year old male, complains of left radial styloid area pain upon deep palpation  Summary of the test:  Nerve conduction study: Left ulnar and median sensory motor responses were normal.  Left median mixed response was 0.3 ms prolonged compared to left ulnar mixed response  Electromyography:  Selected needle examination of left upper extremity muscles was normal.    Conclusion: This is a normal study.  There is no electrodiagnostic evidence of left upper extremity neuropathy, in specific, there is no evidence of left carpal tunnel syndrome.    ------------------------------- Marcial Pacas M.D. PhD  [facility] [address] [place], [place] [address] Tel: [phone] Fax: [phone]  Verbal informed consent was obtained from the patient, patient was informed of potential risk of procedure, including bruising, bleeding, hematoma formation, infection, muscle weakness, muscle pain, numbness, among others.        Delta    Nerve / Sites Muscle Latency Ref. Amplitude Ref. Rel Amp Segments Distance Velocity Ref. Area    ms ms mV mV %  cm m/s m/s mVms  L Median - APB     Wrist APB 3.9 ?4.4 6.1 ?4.0 100 Wrist - APB 7   21.4     Upper arm APB 8.4  6.0  98.6 Upper arm - Wrist 24 54 ?49 20.9  L Ulnar - ADM     Wrist ADM 2.6 ?3.3 11.1 ?6.0 100 Wrist - ADM 7   44.9     B.Elbow ADM 5.9  10.0  90.5 B.Elbow - Wrist 21 63 ?49 42.5     A.Elbow ADM 7.6  10.1  101 A.Elbow - B.Elbow 10 57 ?49 42.7         SNC    Nerve / Sites Rec. Site Peak Lat Ref.  Amp Ref. Segments Distance Peak Diff Ref.    ms ms V V  cm ms ms  L Median, Ulnar - Transcarpal comparison     Median Palm Wrist 2.3 ?2.2 53 ?35 Median Palm - Wrist 8       Ulnar Palm Wrist 2.0  ?2.2 22 ?12 Ulnar Palm - Wrist 8          Median Palm - Ulnar Palm  0.3 ?0.4  L Median - Orthodromic (Dig II, Mid palm)     Dig II Wrist 3.2 ?3.4 10 ?10 Dig II - Wrist 13    L Ulnar - Orthodromic, (Dig V, Mid palm)     Dig V Wrist 2.8 ?3.1 7 ?5 Dig V - Wrist 84             F  Wave    Nerve F Lat Ref.   ms ms  L Ulnar - ADM 28.6 ?32.0       EMG Summary Table    Spontaneous MUAP Recruitment  Muscle IA Fib PSW Fasc Other Amp Dur. Poly Pattern  L. First dorsal interosseous Normal None None None _______ Normal Normal Normal Normal  L. Pronator teres Normal None None None _______ Normal Normal Normal Normal  L. Triceps brachii Normal None None None _______ Normal Normal Normal Normal  L. Biceps brachii Normal None None None _______ Normal Normal Normal Normal  L. Deltoid Normal None None None _______ Normal Normal Normal Normal

## 2021-09-15 NOTE — Progress Notes (Signed)
No chief complaint on file.  ASSESSMENT AND PLAN  50 y.o. year old male   Relapsing remitting multiple sclerosis Crohn's disease  Started Tysarbri treatment since December 2016, both GI and MS symptoms have been stable since, no flareup,   We had extensive discussion with his wife and patient, went over [employer] study, which really did not have enough evidence to draw the conclusion about decrease the risk concerning PML,  His JC virus was at low titer 0.25 in October 2022,  Both patient and his wife consented to proceed with Tysabri every 6 weeks, understand the potential higher risk of recurrent MS symptoms and Crohn's disease, today he will receive regular Tysabri infusion, from next infusion, and will be given every 6 weeks   [email or web address]  "We found a numerical difference in the mean number of new or newly enlarging T2 hyperintense lesions at week 72 between the once every 6 weeks and once every 4 weeks groups, which reached significance under the secondary estimand, but interpretation of statistical differences (or absence thereof) is limited because disease activity in the once every 4 weeks group was lower than expected. The safety profiles of natalizumab once every 6 weeks and once every 4 weeks were similar. Although this trial was not powered to assess differences in risk of progressive multifocal leukoencephalopathy, the occurrence of the (asymptomatic) case underscores the importance of monitoring and risk factor consideration in all patients receiving natalizumab."  New onset left wrist discomfort, finger paresthesia,  No evidence of carpal tunnel syndrome, significant tenderness of left radial styloid upon deep palpitation,  Most consistent with musculoskeletal etiology, suggested left wrist resting, warm compression, as needed NSAIDs  DIAGNOSTIC DATA (LABS, IMAGING, TESTING) - I reviewed  patient records, labs, notes, testing and imaging myself where available.  JC virus antibody in October 2022 JCV antibody: 0.25 Indeterminate  Inhibition Assay: negative MRI brain (with and without) in June 2022 -Multiple supratentorial and infratentorial chronic demyelinating plaques. -No acute plaques.  No significant change from 07/24/2019.  MRI of cervical spine with without contrast in July 2015  Abnormal MRI scan of the cervical spine showing left lateral  disc herniation at C5-6 with significant narrowing of the foramina. Spinal  cord hyperintensities at C2, C4-5 and in the brainstem and cerebellum  likely represent remote age demyelinating plaques. No enhancing lesions  are noted.   Started Tysabri infusion since December 2016    HISTORY OF PRESENT ILLNESS: He was seen by his [facility] since 2000, initially presenting with facial numbness, MRI demonstrates lesions consistent with MS, however he has lost followups, was suggested Copaxone in 2004, but never followed through, he was reevaluated by Dr. Ron Parker in February of 2008, he began to have bladder incontinence, dizziness, blurry vision, unbalanced gait, repeat MRI at that time continued to demonstrate multiple foci of signal hyperintensity, involving brainstem, superior peduncle, deep white matters, there was pericallosal involvement, no enhancement, but there was positive DWIs lesion, he was started on Avenox since 06/13/2007,  he continued to have flulike illness following injection,   He also reported a history of Crohn's disease, last treatment was in 2004 with [place], he no longer has significant GI symptoms, He was treated with immunosuppressive treatment mercaptopurine in late 1990s, for 3-4 months, still had diahrrea.   He had one to 2 flareups each year,  presenting with increased dizziness, unbalanced gait, blurry vision, worsening memory trouble, often partial improvement with steroid package.   Most recent MRI cervical was  in July 2011, remote demyelinating plaque at C2 and C4, no enhancing lesions.   Repeat MRI brain and cervical in Jan 2013 showed a defined hyperintensity in the spinal cord at C2 which likely represents remote age inactive demyelinating plaque. No enhancing lesions are noted. As compared with previous MRI dated 04/30/2010 the C4 spinal cord hyperintensity is no longer seen.   MRI scan of the brain showed multiple brainstem, cerebellar, corpus callosal and periventricular white matter hyperintensities consistent with multiple sclerosis. No enhancing lesions are noted. Presence of T1 black holes  andd corpus callosal atrophy indicate chronic disease. Overall no significant changes compared with MRI from 01/23/2007    He is doing very well, there was no new neurological symptoms, he is taking Avonex, mild flulike illness right out of the injection, improved by Tylenol, or aleve.  He complains of frequent bilateral frontal headaches.   We have reviewed MRI of the brain, which has demonstrated multiple periventricular and subcortical and infratentorial chronic demyelinating plaques. No acute plaques. Compared to prior MRI on 01/23/07, there are has been disease progression.   He continues to do very well, reported prior to MRI scan, he has been off of his Avonex for 4 months, due to insurance reasons, after discussed with Mr. Matsuura we decided to stay on current Avonex treatment, overall only slight change in his MRI scan there is no significant clinical worsening, we will repeat MRI of the brain and cervical spine with and without contrast in one year.   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 Carlean Purl,  he has been treated with prednisone, mercaptopurine in 2011 172m daily for one year, ( which is a nucleoside metabolic inhibitor that results in cell-cycle arrest and death) and mesalamine in the past,   We decided not to proceed with [place]  Keep him on Avonex he complains of 3 weeks history of left arm numbness, since January 2015, has much improved now,    UPDATE 07/28/2014:YY [facility],    He has more memory loss. He also complains of fatigue. No recurrent mass symptoms, he denied gait difficulty, no visual disorder   We have reviewed MRI of brain: scattered bilateral periventricular, subcortical, corpus callosum, cerebellar and brainstem white matter hyperintensities typical for demyelinating disease. No enhancing lesions are noted. The presence of T1 black holes and mild atrophy of corpus callosum indicates chronic disease. Compared with previous MRI scan dated 04/22/13 there appears to be slight disease activity progression    MRI scan of the cervical spine showing left lateral disc herniation at C5-6 with significant narrowing of the foramina. Spinal cord hyperintensities at C2, C4-5 and in the brainstem and cerebellum likely represent remote age demyelinating plaques. No enhancing lesions are noted.   UPDATE Jan 26th 2016:YY He complains of increased short term memory loss, no significant gait difficulty, no vision loss does complains of fatigue, especially with heat exposure We have reviewed most recent MRI of the brain in July 2016 with without contrast, in comparison to MRI of brain in 2008, and 2015, multiple rounded and ovoid, periventricular, subcortical, juxtacortical and infratentorial chronic demyelinating plaque, some T1 black holes, no abnormal  enhancement, no change compared to previous scans   UPDATE Sep 24 2015:YY He came in with his wife, and children at today's clinical visit, I have personally reviewed MRI of  the film in 2016, in comparison with 2008, there was mild progression, he does has slow worsening mild gait difficulty, after extensive discussion, we decided proceed with Tysarbri IV infusion, he has history of Crohn's disease, low titer JC virus antibody 0.27 in July 2016, he has used mercaptopurine in 2011 149m daily for one year, both patient and his wife understand the potential risk and benefit, less than 10/998 chance of PML   UPDATE Dec 07 2016: He has [name] since December of 2016, on a monthly basis, overall has been doing very well, most recent MRI was in December 2017, no significant change compared to previous MRI in 2016,  He noticed visual trouble since early Feb 2018, he could no longer read clock that he used to read, difficulty focusing,  worse on left side.  He denies confusion, no headaches,  He also complains of new onset insomnia, difficulty falling sleep,    UPDATE Aug 22 2019: He is doing very well, no recurrent neurological spells, Crohn's disease is also under good control with [place]   I personally reviewed MRI of the brain with without contrast in October 2020: Multiple T2/flair hyperintensity foci in the brainstem, cerebellum, left thalamus, in a pattern and configuration consistent with chronic demyelinating plaque, no change compared to September 2019, no contrast-enhancement     UPDATE Feb 25 2021: He is tolerating Tysabri infusion very well, no significant side effect noted, noticed around at the end of infusion cycle, JC virus was negative, wants to continue on the infusion,  He has occasionally difficulty sleeping, taking clonazepam as needed, about couple times each month,  UPDATE Sep 09 2021: Left hand paresthesia since march 2022, he is left-hand-dominant, also left wrist discomfort, denies significant neck pain, no flareup of MS symptoms noted  Personally reviewed MRI of brain with without contrast March 24, 2021: Multiple  supratentorium and infratentorium chronic demyelinating plaque, no change from September 2020  MRI of cervical spine with without contrast July 2015: Left lateral disc herniation at C5-6 with significant narrowing of the foraminal, spinal cord hyperintensity at C2, 4 5 and in the brainstem cerebellum, no contrast-enhancement  JC virus antibody in October 2022 JCV antibody: 0.25 Indeterminate  Inhibition Assay: negative  Update September 15, 2021: Patient return for electrodiagnostic study today, showed no evidence of left upper extremity neuropathy, the tenderness is more at the left radial styloid, happened after excessive use of left hand,  We again reviewed multiple MRI of the brain, cervical spine in the past, reviewed his MS history, had worsening neurological symptoms, from 20 14-20 16, correlate with worsening Crohn's disease, there was also evidence of mild progression by MRI standard  His symptoms overall has much improved with Tysabri treatment since December 2016, he tolerated infusion every 4 weeks well, JC virus titer remains at the low side, 0.25 in October 2022  Patient's and wife quoted recent [employer] study for extending Tysabri infusion to every 6 weeks, to avoid long-term infusion side effect, especially the worrisome PML, despite the fact that he is at the lower risk group,  We went over published data from [facility] in April 2022 [email or web address]  "We found a numerical difference in the mean number of new or newly enlarging T2 hyperintense lesions at week 72 between the once every 6 weeks  and once every 4 weeks groups, which reached significance under the secondary estimand, but interpretation of statistical differences (or absence thereof) is limited because disease activity in the once every 4 weeks group was lower than expected. The safety profiles of natalizumab once every 6 weeks and once every 4  weeks were similar. Although this trial was not powered to assess differences in risk of progressive multifocal leukoencephalopathy, the occurrence of the (asymptomatic) case underscores the importance of monitoring and risk factor consideration in all patients receiving natalizumab."  PHYSICAL EXAM  Vitals:   02/25/21 1446  BP: 128/80  Pulse: 78  Weight: 207 lb (93.9 kg)  Height: 5' 11"  (1.803 m)   Body mass index is 28.87 kg/m.  PHYSICAL EXAMNIATION:  Gen: NAD, conversant, well nourised, well groomed           NEUROLOGICAL EXAM:  MENTAL STATUS: Speech/Cognition: Awake, alert, normal speech, oriented to history taking and casual conversation.  CRANIAL NERVES: CN II: Visual fields are full to confrontation.  Pupils are round equal and briskly reactive to light. CN III, IV, VI: extraocular movement are normal. No ptosis. CN V: Facial sensation is intact to light touch. CN VII: Face is symmetric with normal eye closure and smile. CN VIII: Hearing is normal to casual conversation CN IX, X: Palate elevates symmetrically. Phonation is normal. CN XI: Head turning and shoulder shrug are intact  MOTOR: Muscle bulk and tone are normal. Muscle strength is normal  SENSORY: Intact to light touch,   COORDINATION: There is no trunk or limb ataxia.    GAIT/STANCE: Posture is normal. Gait is steady with normal steps, base, arm swing and turning.    REVIEW OF SYSTEMS: Out of a complete 14 system review of symptoms, the patient complains only of the following symptoms, memory loss, snoring, and all other reviewed systems are negative.   ALLERGIES: Allergies  Allergen Reactions   Lialda [Mesalamine] Diarrhea    Severe diarrhea     HOME MEDICATIONS: Outpatient Medications Prior to Visit  Medication Sig Dispense Refill   acetaminophen (TYLENOL) 325 MG tablet Take by mouth.     celecoxib (CELEBREX) 200 MG capsule Take 200 mg by mouth daily.     chlorhexidine (HIBICLENS) 4 %  external liquid Apply topically daily as needed. 120 mL 11   clonazePAM (KLONOPIN) 0.5 MG tablet TAKE 1 TABLET BY MOUTH AT BEDTIME AS NEEDED FOR ANXIETY. 30 tablet 2   fluticasone (FLONASE) 50 MCG/ACT nasal spray SPRAY 2 SPRAYS INTO EACH NOSTRIL EVERY DAY 48 mL 3   gabapentin (NEURONTIN) 300 MG capsule Take 2 capsules (600 mg total) by mouth 3 (three) times daily as needed. 90 capsule 11   MULTIPLE VITAMIN PO Take by mouth.     natalizumab (TYSABRI) 300 MG/15ML injection Inject into the vein. 1 hour infusion monthly     No facility-administered medications prior to visit.     PAST MEDICAL HISTORY: Past Medical History:  Diagnosis Date   Anal fistula 1996   Anal stenosis    Atrial fibrillation (HCC)    pt and wife deny this   Colon stricture ([place])    descending colon   Crohn's disease ([place])    Hx of colonic polyp 10/21/2016   MRSA (methicillin resistant Staphylococcus aureus)    Multiple sclerosis (HCC)    Neuromuscular disorder ([place])    Patent ductus arteriosus    Regional enteritis of large intestine ([place])    S/P left hemicolectomy 2015   [facility]-done for stricture  Stricture of descending colon due to Crohn's disease 02/13/2012     PAST SURGICAL HISTORY: Past Surgical History:  Procedure Laterality Date   COLON SURGERY  2015   descending colon stricture    COLONOSCOPY     multiple     FAMILY HISTORY: Family History  Problem Relation Age of Onset   Heart disease Mother    Ulcerative colitis Father    Diabetes Paternal Grandmother    Prostate cancer Paternal Grandfather    Colon cancer Neg Hx    Rectal cancer Neg Hx    Stomach cancer Neg Hx    Colon polyps Neg Hx    Esophageal cancer Neg Hx      SOCIAL HISTORY: Social History   Socioeconomic History   Marital status: Married    Spouse name: [name]   Number of children: 3   Years of education: 28   Highest education level: Not on file  Occupational History   Occupation: disabled  Tobacco Use    Smoking status: Former    Packs/day: 1.00    Years: 15.00    Pack years: 15.00    Types: Cigarettes    Quit date: 10/24/2004    Years since quitting: 16.9   Smokeless tobacco: Never  Vaping Use   Vaping Use: Never used  Substance and Sexual Activity   Alcohol use: Never   Drug use: Never   Sexual activity: Yes  Other Topics Concern   Not on file  Social History Narrative   Married, 1 son one daughter. 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 Multimedia programmer)   Disabled, previously worked at [employer], disability due to multiple sclerosis   Caffeine one daily   Left handed.   Education- one year of college.   Social Determinants of Health   Financial Resource Strain: Not on file  Food Insecurity: Not on file  Transportation Needs: Not on file  Physical Activity: Not on file  Stress: Not on file  Social Connections: Not on file  Intimate Partner Violence: Not on file

## 2021-12-27 ENCOUNTER — Other Ambulatory Visit: Payer: Self-pay | Admitting: Neurology

## 2021-12-28 NOTE — Telephone Encounter (Signed)
Verify Drug Registry For clonazepam 0.5 mg tablet ?Last Filled: 09/30/2020 ?Quantity: 30 tablets for 30 days ?Last appointment: 09/09/2021 ?Next appointment: 03/15/2022 ?

## 2022-01-03 ENCOUNTER — Telehealth: Payer: Self-pay | Admitting: Family Medicine

## 2022-01-03 NOTE — Telephone Encounter (Signed)
Patient called regarding a bill he received from [facility] from [facility] 06/26/20. He states that it was coded incorrectly and that his insurance isn't paying for the bill. Account # [id number] billed amount $426.78. I have sent this information to [place] at [facility]. ? ? ?Patient verbalized understanding that I can't resubmit claim to his inusurance but that I would send message to [facility]. ? ?CB# [phone] ?

## 2022-01-27 ENCOUNTER — Other Ambulatory Visit (INDEPENDENT_AMBULATORY_CARE_PROVIDER_SITE_OTHER): Payer: Self-pay

## 2022-01-27 DIAGNOSIS — Z0289 Encounter for other administrative examinations: Secondary | ICD-10-CM

## 2022-01-31 ENCOUNTER — Telehealth: Payer: Self-pay | Admitting: *Deleted

## 2022-01-31 NOTE — Telephone Encounter (Signed)
Lab sample placed in [facility] box 01/28/22.  ?

## 2022-01-31 NOTE — Telephone Encounter (Signed)
Patient called to follow up on incorrect coding of labs from [facility] 05/29/2020. Patient states he's never heard back from anyone and has been contacted from [facility] about his outstanding bill going to collections.  ? ?Please advise at [phone]. ?

## 2022-02-07 NOTE — Telephone Encounter (Signed)
JCV ab: ? ?0.26 H - Indeterminate ?Inhibition Assay - Negative  ? ?Printed results will be scanned to chart.  ?

## 2022-03-10 ENCOUNTER — Other Ambulatory Visit: Payer: Medicare Other

## 2022-03-11 ENCOUNTER — Other Ambulatory Visit: Payer: Medicare Other

## 2022-03-11 DIAGNOSIS — Z13220 Encounter for screening for lipoid disorders: Secondary | ICD-10-CM

## 2022-03-12 LAB — COMPREHENSIVE METABOLIC PANEL
AG Ratio: 1.7 (calc) (ref 1.0–2.5)
ALT: 18 U/L (ref 9–46)
AST: 20 U/L (ref 10–35)
Albumin: 4.2 g/dL (ref 3.6–5.1)
Alkaline phosphatase (APISO): 71 U/L (ref 35–144)
BUN: 12 mg/dL (ref 7–25)
CO2: 28 mmol/L (ref 20–32)
Calcium: 9 mg/dL (ref 8.6–10.3)
Chloride: 103 mmol/L (ref 98–110)
Creat: 1.1 mg/dL (ref 0.70–1.30)
Globulin: 2.5 g/dL (calc) (ref 1.9–3.7)
Glucose, Bld: 87 mg/dL (ref 65–99)
Potassium: 4.3 mmol/L (ref 3.5–5.3)
Sodium: 140 mmol/L (ref 135–146)
Total Bilirubin: 1.1 mg/dL (ref 0.2–1.2)
Total Protein: 6.7 g/dL (ref 6.1–8.1)

## 2022-03-12 LAB — CBC WITH DIFFERENTIAL/PLATELET
Absolute Monocytes: 684 cells/uL (ref 200–950)
Basophils Absolute: 60 cells/uL (ref 0–200)
Basophils Relative: 1 %
Eosinophils Absolute: 372 cells/uL (ref 15–500)
Eosinophils Relative: 6.2 %
HCT: 42.8 % (ref 38.5–50.0)
Hemoglobin: 14.2 g/dL (ref 13.2–17.1)
Lymphs Abs: 2658 cells/uL (ref 850–3900)
MCH: 28.1 pg (ref 27.0–33.0)
MCHC: 33.2 g/dL (ref 32.0–36.0)
MCV: 84.6 fL (ref 80.0–100.0)
MPV: 10.8 fL (ref 7.5–12.5)
Monocytes Relative: 11.4 %
Neutro Abs: 2226 cells/uL (ref 1500–7800)
Neutrophils Relative %: 37.1 %
Platelets: 140 10*3/uL (ref 140–400)
RBC: 5.06 10*6/uL (ref 4.20–5.80)
RDW: 13.4 % (ref 11.0–15.0)
Total Lymphocyte: 44.3 %
WBC: 6 10*3/uL (ref 3.8–10.8)

## 2022-03-12 LAB — LIPID PANEL
Cholesterol: 193 mg/dL (ref <200)
HDL: 48 mg/dL (ref >=40)
LDL Cholesterol (Calc): 125 mg/dL (calc) — ABNORMAL HIGH
Non-HDL Cholesterol (Calc): 145 mg/dL (calc) — ABNORMAL HIGH (ref <130)
Total CHOL/HDL Ratio: 4 (calc) (ref <5.0)
Triglycerides: 97 mg/dL (ref <150)

## 2022-03-15 ENCOUNTER — Ambulatory Visit: Payer: Medicare Other | Admitting: Neurology

## 2022-03-15 VITALS — BP 116/75 | HR 80 | Ht 71.0 in | Wt 200.0 lb

## 2022-03-15 DIAGNOSIS — G35 Multiple sclerosis: Secondary | ICD-10-CM

## 2022-03-15 DIAGNOSIS — M25532 Pain in left wrist: Secondary | ICD-10-CM | POA: Diagnosis not present

## 2022-03-15 NOTE — Patient Instructions (Signed)
Orders Placed This Encounter  Procedures   MR BRAIN W WO CONTRAST   MR CERVICAL SPINE W WO CONTRAST

## 2022-03-15 NOTE — Progress Notes (Signed)
Patient: Jonathon Vazquez Date of Birth: [birth year]  Reason for Visit: Follow up for MS History from: Patient Primary Neurologist: Dr. Krista Blue  ASSESSMENT AND PLAN 51 y.o. year old male   1.  Relapsing remitting multiple sclerosis 2.  Crohn's disease, no exacerbations -On Tysabri since December 2016 -JCV April 2023 0.26, negative  -MRI brain with and without contrast in June 2022 showed multiple plaques, no significant change from September 2020 -Continue Tysabri every 4 weeks -CBC, CMP 03/11/22 were normal -Order MRI of the brain and cervical spine with and without contrast for MS surveillance  3.  Left wrist discomfort, finger paresthesia -NCV/EMG showed no evidence of CTS, felt most consistent with musculoskeletal etiology -Continued symptoms of left hand numbness but symptoms are positional lying on left side at night, MRI cervical spine in 2015 at the time showed left lateral disc herniation at C5-6, will check on this with updated MRI  4.  Chronic insomnia -On clonazepam 0.5 mg PRN  HISTORY  He was seen by his [facility] since 2000, initially presenting with facial numbness, MRI demonstrates lesions consistent with MS, however he has lost followups, was suggested Copaxone in 2004, but never followed through, he was reevaluated by Dr. Ron Parker in February of 2008, he began to have bladder incontinence, dizziness, blurry vision, unbalanced gait, repeat MRI at that time continued to demonstrate multiple foci of signal hyperintensity, involving brainstem, superior peduncle, deep white matters, there was pericallosal involvement, no enhancement, but there was positive DWIs lesion, he was started on Avenox since 06/13/2007,  he continued to have flulike illness following injection,   He also reported a history of Crohn's disease, last treatment was in 2004 with [place], he no longer has significant GI symptoms, He was treated with immunosuppressive treatment mercaptopurine in late 1990s, for 3-4  months, still had diahrrea.   He had one to 2 flareups each year,  presenting with increased dizziness, unbalanced gait, blurry vision, worsening memory trouble, often partial improvement with steroid package.   Most recent MRI cervical was in July 2011, remote demyelinating plaque at C2 and C4, no enhancing lesions.   Repeat MRI brain and cervical in Jan 2013 showed a defined hyperintensity in the spinal cord at C2 which likely represents remote age inactive demyelinating plaque. No enhancing lesions are noted. As compared with previous MRI dated 04/30/2010 the C4 spinal cord hyperintensity is no longer seen.   MRI scan of the brain showed multiple brainstem, cerebellar, corpus callosal and periventricular white matter hyperintensities consistent with multiple sclerosis. No enhancing lesions are noted. Presence of T1 black holes  andd corpus callosal atrophy indicate chronic disease. Overall no significant changes compared with MRI from 01/23/2007    He is doing very well, there was no new neurological symptoms, he is taking Avonex, mild flulike illness right out of the injection, improved by Tylenol, or aleve.  He complains of frequent bilateral frontal headaches.   We have reviewed MRI of the brain, which has demonstrated multiple periventricular and subcortical and infratentorial chronic demyelinating plaques. No acute plaques. Compared to prior MRI on 01/23/07, there are has been disease progression.   He continues to do very well, reported prior to MRI scan, he has been off of his Avonex for 4 months, due to insurance reasons, after discussed with Mr. Deeg we decided to stay on current Avonex treatment, overall only slight change in his MRI scan there is no significant clinical worsening, we will repeat MRI of the brain and cervical  spine with and without contrast in one year.   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 Carlean Purl, he has been treated with prednisone, mercaptopurine in 2011 184m daily for one year, ( which is a nucleoside metabolic inhibitor that results in cell-cycle arrest and death) and mesalamine in the past,   We decided not to proceed with [place]  Keep him on Avonex he complains of 3 weeks history of left arm numbness, since January 2015, has much improved now,   Update September 15, 2021: Patient return for electrodiagnostic study today, showed no evidence of left upper extremity neuropathy, the tenderness is more at the left radial styloid, happened after excessive use of left hand,   We again reviewed multiple MRI of the brain, cervical spine in the past, reviewed his MS history, had worsening neurological symptoms, from 20 14-20 16, correlate with worsening Crohn's disease, there was also evidence of mild progression by MRI standard  His symptoms overall has much improved with Tysabri treatment since December 2016, he tolerated infusion every 4 weeks well, JC virus titer remains at the low side, 0.25 in October 2022  Patient's and wife quoted recent biogen study for extending Tysabri infusion to every 6 weeks, to avoid long-term infusion side effect, especially the worrisome PML, despite the fact that he is at the lower risk group,  We went over published data from [facility] in April 2022 https://[email or web address]/journals/[email or web address]/fulltext   "We found a numerical difference in the mean number of new or newly enlarging T2 hyperintense lesions at week 72 between the once every 6 weeks and once every 4 weeks groups, which reached significance under the secondary estimand, but interpretation of statistical differences (or  absence thereof) is limited because disease activity in the once every 4 weeks group was lower than expected. The safety profiles of natalizumab once every 6 weeks and once every 4 weeks were similar. Although this trial was not powered to assess differences in risk of progressive multifocal leukoencephalopathy, the occurrence of the (asymptomatic) case underscores the importance of monitoring and risk factor consideration in all patients receiving natalizumab."  Update Mar 15, 2022 SS: Remains on Tysabri every 4 weeks, decided not to move out to every 6 weeks, since remaining stable. Left hand still gets numb only at night when lying in the bed on left side, doesn't feel weak, he falls asleep, in the morning it is tingling. Can stumble, no falls, short term memory isn't as good. Is disabled. No flares of Crohn's disease.  Doesn't take gabapentin, has if needed. Takes Klonopin PRN for sleep, usually twice a week. His wife is a nMarine scientistand keeps a close eye on him.  REVIEW OF SYSTEMS: Out of a complete 14 system review of symptoms, the patient complains only of the following symptoms, and all other reviewed systems are negative.  See HPI  ALLERGIES: Allergies  Allergen Reactions   Lialda [Mesalamine] Diarrhea    Severe diarrhea    HOME MEDICATIONS: Outpatient Medications Prior to Visit  Medication Sig Dispense Refill   acetaminophen (TYLENOL) 325 MG tablet Take by mouth.     chlorhexidine (HIBICLENS) 4 % external liquid Apply topically daily as needed. 120 mL 11   clonazePAM (KLONOPIN)  0.5 MG tablet TAKE 1 TABLET BY MOUTH AT BEDTIME AS NEEDED FOR ANXIETY 30 tablet 2   fluticasone (FLONASE) 50 MCG/ACT nasal spray SPRAY 2 SPRAYS INTO EACH NOSTRIL EVERY DAY 48 mL 3   gabapentin (NEURONTIN) 300 MG capsule Take 2 capsules (600 mg total) by mouth 3 (three) times daily as needed. 90 capsule 11   MULTIPLE VITAMIN PO Take by mouth.     natalizumab (TYSABRI) 300 MG/15ML injection Inject into the vein. 1  hour infusion monthly     celecoxib (CELEBREX) 200 MG capsule Take 200 mg by mouth daily.     No facility-administered medications prior to visit.    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    PAST SURGICAL HISTORY: Past Surgical History:  Procedure Laterality Date   COLON SURGERY  2015   descending colon stricture    COLONOSCOPY     multiple    FAMILY HISTORY: Family History  Problem Relation Age of Onset   Heart disease Mother    Ulcerative colitis Father    Diabetes Paternal Grandmother    Prostate cancer Paternal Grandfather    Colon cancer Neg Hx    Rectal cancer Neg Hx    Stomach cancer Neg Hx    Colon polyps Neg Hx    Esophageal cancer Neg Hx     SOCIAL HISTORY: Social History   Socioeconomic History   Marital status: Married    Spouse name: [name]   Number of children: 3   Years of education: 13   Highest education level: Not on file  Occupational History   Occupation: disabled  Tobacco Use   Smoking status: Former    Packs/day: 1.00    Years: 15.00    Pack years: 15.00    Types: Cigarettes    Quit date: 10/24/2004    Years since quitting: 17.4   Smokeless tobacco: Never  Vaping Use   Vaping Use: Never used  Substance and Sexual Activity   Alcohol use: Never   Drug use: Never   Sexual activity: Yes  Other Topics Concern   Not on file  Social History Narrative   Married, 1 son one daughter. 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 Multimedia programmer)    Disabled, previously worked at [employer], disability due to multiple sclerosis   Caffeine one daily   Left handed.   Education- one year of college.   Social Determinants of Health   Financial Resource Strain: Not on file  Food Insecurity: Not on file  Transportation Needs: Not on file  Physical Activity: Not on file  Stress: Not on file  Social Connections: Not on file  Intimate Partner Violence: Not on file    PHYSICAL EXAM  Vitals:   03/15/22 1409  BP: 116/75  Pulse: 80  Weight: 200 lb (90.7 kg)  Height: 5' 11"  (1.803 m)   Body mass index is 27.89 kg/m.  Generalized: Well  developed, in no acute distress  Neurological examination  Mentation: Alert oriented to time, place, history taking. Follows all commands speech and language fluent Cranial nerve II-XII: Pupils were equal round reactive to light. Extraocular movements were full, visual field were full on confrontational test. Facial sensation and strength were normal. Head turning and shoulder shrug  were normal and symmetric. Motor: The motor testing reveals 5 over 5 strength of all 4 extremities. Good symmetric motor tone is noted throughout.  Sensory: Sensory testing is intact to soft touch on all 4 extremities. No evidence of extinction is noted.  Coordination: Cerebellar testing reveals good finger-nose-finger and heel-to-shin bilaterally.  Gait and station: Gait is normal. Tandem gait is slightly unsteady. Reflexes: Deep tendon reflexes are symmetric and normal bilaterally.   DIAGNOSTIC DATA (LABS, IMAGING, TESTING) - I reviewed patient records, labs, notes, testing and imaging myself where available.  Lab Results  Component Value Date   WBC 6.0 03/11/2022   HGB 14.2 03/11/2022   HCT 42.8 03/11/2022   MCV 84.6 03/11/2022   PLT 140 03/11/2022      Component Value Date/Time   NA 140 03/11/2022 0811   NA 138 08/22/2019 1515   K 4.3 03/11/2022 0811   CL 103 03/11/2022 0811   CO2 28 03/11/2022 0811    GLUCOSE 87 03/11/2022 0811   BUN 12 03/11/2022 0811   BUN 12 08/22/2019 1515   CREATININE 1.10 03/11/2022 0811   CALCIUM 9.0 03/11/2022 0811   PROT 6.7 03/11/2022 0811   PROT 6.8 08/22/2019 1515   ALBUMIN 4.2 08/22/2019 1515   AST 20 03/11/2022 0811   ALT 18 03/11/2022 0811   ALKPHOS 81 08/22/2019 1515   BILITOT 1.1 03/11/2022 0811   BILITOT 1.1 08/22/2019 1515   GFRNONAA 75 05/29/2020 0823   GFRAA 87 05/29/2020 0823   Lab Results  Component Value Date   CHOL 193 03/11/2022   HDL 48 03/11/2022   LDLCALC 125 (H) 03/11/2022   TRIG 97 03/11/2022   CHOLHDL 4.0 03/11/2022   No results found for: HGBA1C No results found for: [id number] Lab Results  Component Value Date   TSH 1.420 08/22/2019    Butler Denmark, AGNP-C, DNP 03/15/2022, 2:35 PM [facility] [address], [place], [place] [address] [phone]

## 2022-03-17 ENCOUNTER — Ambulatory Visit (INDEPENDENT_AMBULATORY_CARE_PROVIDER_SITE_OTHER): Payer: Medicare Other | Admitting: Family Medicine

## 2022-03-17 VITALS — BP 120/72 | HR 84 | Temp 97.8°F | Ht 71.0 in | Wt 205.6 lb

## 2022-03-17 DIAGNOSIS — G35 Multiple sclerosis: Secondary | ICD-10-CM | POA: Diagnosis not present

## 2022-03-17 DIAGNOSIS — Z125 Encounter for screening for malignant neoplasm of prostate: Secondary | ICD-10-CM | POA: Diagnosis not present

## 2022-03-17 DIAGNOSIS — Z13220 Encounter for screening for lipoid disorders: Secondary | ICD-10-CM | POA: Diagnosis not present

## 2022-03-17 DIAGNOSIS — K5000 Crohn's disease of small intestine without complications: Secondary | ICD-10-CM

## 2022-03-17 DIAGNOSIS — Z0000 Encounter for general adult medical examination without abnormal findings: Secondary | ICD-10-CM | POA: Diagnosis not present

## 2022-03-17 NOTE — Progress Notes (Signed)
Subjective:    Patient ID: Jonathon Vazquez, male    DOB: 1971-04-10, 51 y.o.   MRN: [id number]  HPI Patient is a 51 year old African-American male who is here today for complete physical exam. Past medical history is significant for multiple sclerosis as well as Crohn's colitis on chronic immunosuppressive therapy. In 2015 he underwent partial resection of the colon. Last colonoscopy was 10/22 and was clear.  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 Desirable range <100 mg/dL for primary prevention;   <70 mg/dL for patients with CHD or diabetic patients  with > or = 2 CHD risk factors. Marland Kitchen LDL-C is now calculated using the Martin-Hopkins  calculation, which is a validated novel method providing  better accuracy than the Friedewald equation in the  estimation of LDL-C.  Cresenciano Genre et al. Annamaria Helling. 9485;462(70): 2061-2068  (http://education.[facility].com/faq/FAQ164)    Total CHOL/HDL Ratio 03/11/2022 4.0  <5.0 (calc) Final   Non-HDL Cholesterol (Calc) 03/11/2022 145 (H)  <130 mg/dL (calc) Final   Comment: For patients with diabetes plus 1 major ASCVD risk  factor, treating to a non-HDL-C goal of <100 mg/dL   (LDL-C of <70 mg/dL) is considered a therapeutic  option.    Glucose, Bld 03/11/2022 87  65 - 99 mg/dL Final   Comment: .            Fasting reference interval .    BUN 03/11/2022 12  7 - 25 mg/dL Final   Creat 03/11/2022 1.10  0.70 - 1.30 mg/dL Final   BUN/Creatinine Ratio 35/00/9381 NOT APPLICABLE  6 - 22 (calc) Final   Sodium 03/11/2022 140  135 - 146 mmol/L Final   Potassium 03/11/2022 4.3  3.5 - 5.3 mmol/L Final   Chloride 03/11/2022 103  98 - 110 mmol/L Final   CO2 03/11/2022 28  20 - 32 mmol/L Final   Calcium 03/11/2022 9.0  8.6 - 10.3 mg/dL Final   Total Protein 03/11/2022 6.7  6.1 - 8.1 g/dL Final   Albumin 03/11/2022 4.2  3.6 - 5.1 g/dL Final   Globulin 03/11/2022 2.5  1.9 - 3.7 g/dL (calc) Final   AG Ratio 03/11/2022 1.7  1.0 - 2.5 (calc) Final   Total Bilirubin 03/11/2022 1.1  0.2 - 1.2 mg/dL Final   Alkaline phosphatase (APISO) 03/11/2022 71  35 - 144 U/L Final   AST 03/11/2022 20  10 - 35 U/L Final   ALT 03/11/2022 18  9 - 46 U/L Final   WBC 03/11/2022 6.0  3.8 - 10.8 Thousand/uL Final   RBC 03/11/2022 5.06  4.20 - 5.80 Million/uL Final   Hemoglobin 03/11/2022 14.2  13.2 - 17.1 g/dL Final   HCT 03/11/2022 42.8  38.5 - 50.0 % Final   MCV 03/11/2022 84.6  80.0 -  100.0 fL Final   MCH 03/11/2022 28.1  27.0 - 33.0 pg Final   MCHC 03/11/2022 33.2  32.0 - 36.0 g/dL Final   RDW 03/11/2022 13.4  11.0 - 15.0 % Final   Platelets 03/11/2022 140  140 - 400 Thousand/uL Final   MPV 03/11/2022 10.8  7.5 - 12.5 fL Final   Neutro Abs 03/11/2022 2,226  1,500 - 7,800 cells/uL Final   Lymphs Abs 03/11/2022 2,658  850 - 3,900 cells/uL Final   Absolute Monocytes 03/11/2022 684  200 - 950 cells/uL Final   Eosinophils Absolute 03/11/2022 372  15 - 500 cells/uL Final   Basophils Absolute 03/11/2022 60  0 - 200 cells/uL Final   Neutrophils Relative % 03/11/2022 37.1  % Final   Total Lymphocyte 03/11/2022 44.3  % Final   Monocytes Relative 03/11/2022 11.4  % Final   Eosinophils Relative  03/11/2022 6.2  % Final   Basophils Relative 03/11/2022 1.0  % Final    Past Medical History:  Diagnosis Date   Anal fistula 1996   Anal stenosis    Atrial fibrillation (HCC)    pt and wife deny this   Colon stricture ([place])    descending colon   Crohn's disease ([place])    Hx of colonic polyp 10/21/2016   MRSA (methicillin resistant Staphylococcus aureus)    Multiple sclerosis (HCC)    Neuromuscular disorder ([place])    Patent ductus arteriosus    Regional enteritis of large intestine ([place])    S/P left hemicolectomy 2015   [facility] for stricture   Stricture of descending colon due to Crohn's disease 02/13/2012   Past Surgical History:  Procedure Laterality Date   COLON SURGERY  2015   descending colon stricture    COLONOSCOPY     multiple   Current Outpatient Medications on File Prior to Visit  Medication Sig Dispense Refill   acetaminophen (TYLENOL) 325 MG tablet Take by mouth.     chlorhexidine (HIBICLENS) 4 % external liquid Apply topically daily as needed. 120 mL 11   clonazePAM (KLONOPIN) 0.5 MG tablet TAKE 1 TABLET BY MOUTH AT BEDTIME AS NEEDED FOR ANXIETY 30 tablet 2   fluticasone (FLONASE) 50 MCG/ACT nasal spray SPRAY 2 SPRAYS INTO EACH NOSTRIL EVERY DAY 48 mL 3   gabapentin (NEURONTIN) 300 MG capsule Take 2 capsules (600 mg total) by mouth 3 (three) times daily as needed. 90 capsule 11   MULTIPLE VITAMIN PO Take by mouth.     natalizumab (TYSABRI) 300 MG/15ML injection Inject into the vein. 1 hour infusion monthly     No current facility-administered medications on file prior to visit.   Allergies  Allergen Reactions   Lialda [Mesalamine] Diarrhea    Severe diarrhea   Social History   Socioeconomic History   Marital status: Married    Spouse name: [name]   Number of children: 3   Years of education: 13   Highest education level: Not on file  Occupational History   Occupation: disabled  Tobacco Use   Smoking status: Former    Packs/day: 1.00    Years:  15.00    Pack years: 15.00    Types: Cigarettes    Quit date: 10/24/2004    Years since quitting: 17.4   Smokeless tobacco: Never  Vaping Use   Vaping Use: Never used  Substance and Sexual Activity   Alcohol use: Never   Drug use: Never   Sexual activity: Yes  Other Topics Concern   Not on file  Social History Narrative   Married, 1 son one daughter. 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 Multimedia programmer)   Disabled, previously worked at [employer], disability due to multiple sclerosis   Caffeine one daily   Left handed.   Education- one year of college.   Social Determinants of Health   Financial Resource Strain: Not on file  Food Insecurity: Not on file  Transportation Needs: Not on file  Physical Activity: Not on file  Stress: Not on file  Social Connections: Not on file  Intimate Partner Violence: Not on file   Family History  Problem Relation Age of Onset   Heart disease Mother    Ulcerative colitis Father    Diabetes Paternal Grandmother    Prostate cancer Paternal Grandfather    Colon cancer Neg Hx    Rectal cancer Neg Hx    Stomach cancer Neg Hx    Colon polyps Neg Hx    Esophageal cancer Neg Hx      Review of Systems  All other systems reviewed and are negative.     Objective:   Physical Exam Vitals reviewed.  Constitutional:      General: He is not in acute distress.    Appearance: He is well-developed. He is not diaphoretic.  HENT:     Head: Normocephalic and atraumatic.     Right Ear: External ear normal.     Left Ear: External ear normal.     Nose: Nose normal.     Mouth/Throat:     Pharynx: No oropharyngeal exudate.  Eyes:     General: No scleral icterus.       Right eye: No discharge.        Left eye: No discharge.     Conjunctiva/sclera: Conjunctivae normal.     Pupils: Pupils are equal, round, and reactive to light.  Neck:     Thyroid: No thyromegaly.     Vascular: No JVD.     Trachea: No tracheal deviation.  Cardiovascular:     Rate and Rhythm: Normal rate and  regular rhythm.     Heart sounds: Normal heart sounds. No murmur heard.   No friction rub. No gallop.  Pulmonary:     Effort: Pulmonary effort is normal. No respiratory distress.     Breath sounds: Normal breath sounds. No stridor. No wheezing or rales.  Chest:     Chest wall: No tenderness.  Abdominal:     General: Bowel sounds are normal. There is no distension.     Palpations: Abdomen is soft. There is no mass.     Tenderness: There is no abdominal tenderness. There is no guarding or rebound.  Genitourinary:    Penis: Normal.      Testes: Normal. Cremasteric reflex is present.        Right: Mass not present.        Left: Mass not present.  Musculoskeletal:        General: No tenderness. Normal range of motion.     Cervical back: Normal range of motion and neck supple.  Lymphadenopathy:     Cervical: No cervical adenopathy.  Skin:    General: Skin is warm.     Coloration: Skin is not pale.     Findings: No erythema or rash.  Neurological:     Mental Status: He is alert and oriented to person, place, and time.     Cranial Nerves: No cranial nerve deficit.     Motor: No abnormal muscle tone.     Coordination:  Coordination normal.     Deep Tendon Reflexes: Reflexes are normal and symmetric.  Psychiatric:        Behavior: Behavior normal.        Thought Content: Thought content normal.        Judgment: Judgment normal.   There are numerous keloids on his shoulders and upper back.       Assessment & Plan:  General medical exam  Prostate cancer screening  Screening cholesterol level  Relapsing remitting multiple sclerosis (HCC)  Crohn's disease of small intestine without complication ([place]) CPE is normal today.  BP is excellent.  Immunizations up to date.  Add PSA to screen for prostate cancer.  Colonoscopy is UTD.   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

## 2022-03-18 LAB — PSA: PSA: 0.98 ng/mL (ref <=4.00)

## 2022-04-20 NOTE — Patient Instructions (Signed)
Jonathon Vazquez , Thank you for taking time to come for your Medicare Wellness Visit. I appreciate your ongoing commitment to your health goals. Please review the following plan we discussed and let me know if I can assist you in the future.   Screening recommendations/referrals: Colonoscopy: Done 08/12/2021 Repeat every 3 years.  Recommended yearly ophthalmology/optometry visit for glaucoma screening and checkup Recommended yearly dental visit for hygiene and checkup  Vaccinations: Influenza vaccine: Due Fall 2023. Pneumococcal vaccine: Done 10/03/2016 and 09/28/2015. Tdap vaccine: Done 04/30/2018 Repeat in 10 years  Shingles vaccine: Available at your local pharmacy.   Covid-19: Done 02/19/2020 and 01/12/2020.  Advanced directives: Please bring a copy of your health care power of attorney and living will to the office to be added to your chart at your convenience.   Conditions/risks identified: Aim for 30 minutes of exercise or brisk walking, 6-8 glasses of water, and 5 servings of fruits and vegetables each day.   Next appointment: Follow up in one year for your annual wellness visit 2024.  Preventive Care 40-64 Years, Male Preventive care refers to lifestyle choices and visits with your health care provider that can promote health and wellness. What does preventive care include? A yearly physical exam. This is also called an annual well check. Dental exams once or twice a year. Routine eye exams. Ask your health care provider how often you should have your eyes checked. Personal lifestyle choices, including: Daily care of your teeth and gums. Regular physical activity. Eating a healthy diet. Avoiding tobacco and drug use. Limiting alcohol use. Practicing safe sex. Taking low-dose aspirin every day starting at age 45. What happens during an annual well check? The services and screenings done by your health care provider during your annual well check will depend on your age, overall  health, lifestyle risk factors, and family history of disease. Counseling  Your health care provider may ask you questions about your: Alcohol use. Tobacco use. Drug use. Emotional well-being. Home and relationship well-being. Sexual activity. Eating habits. Work and work Statistician. Screening  You may have the following tests or measurements: Height, weight, and BMI. Blood pressure. Lipid and cholesterol levels. These may be checked every 5 years, or more frequently if you are over 61 years old. Skin check. Lung cancer screening. You may have this screening every year starting at age 29 if you have a 30-pack-year history of smoking and currently smoke or have quit within the past 15 years. Fecal occult blood test (FOBT) of the stool. You may have this test every year starting at age 52. Flexible sigmoidoscopy or colonoscopy. You may have a sigmoidoscopy every 5 years or a colonoscopy every 10 years starting at age 74. Prostate cancer screening. Recommendations will vary depending on your family history and other risks. Hepatitis C blood test. Hepatitis B blood test. Sexually transmitted disease (STD) testing. Diabetes screening. This is done by checking your blood sugar (glucose) after you have not eaten for a while (fasting). You may have this done every 1-3 years. Discuss your test results, treatment options, and if necessary, the need for more tests with your health care provider. Vaccines  Your health care provider may recommend certain vaccines, such as: Influenza vaccine. This is recommended every year. Tetanus, diphtheria, and acellular pertussis (Tdap, Td) vaccine. You may need a Td booster every 10 years. Zoster vaccine. You may need this after age 39. Pneumococcal 13-valent conjugate (PCV13) vaccine. You may need this if you have certain conditions and have not  been vaccinated. Pneumococcal polysaccharide (PPSV23) vaccine. You may need one or two doses if you smoke  cigarettes or if you have certain conditions. Talk to your health care provider about which screenings and vaccines you need and how often you need them. This information is not intended to replace advice given to you by your health care provider. Make sure you discuss any questions you have with your health care provider. Document Released: 11/06/2015 Document Revised: 06/29/2016 Document Reviewed: 08/11/2015 Elsevier Interactive Patient Education  2017 [employer] Prevention in the Home Falls can cause injuries. They can happen to people of all ages. There are many things you can do to make your home safe and to help prevent falls. What can I do on the outside of my home? Regularly fix the edges of walkways and driveways and fix any cracks. Remove anything that might make you trip as you walk through a door, such as a raised step or threshold. Trim any bushes or trees on the path to your home. Use bright outdoor lighting. Clear any walking paths of anything that might make someone trip, such as rocks or tools. Regularly check to see if handrails are loose or broken. Make sure that both sides of any steps have handrails. Any raised decks and porches should have guardrails on the edges. Have any leaves, snow, or ice cleared regularly. Use sand or salt on walking paths during winter. Clean up any spills in your garage right away. This includes oil or grease spills. What can I do in the bathroom? Use night lights. Install grab bars by the toilet and in the tub and shower. Do not use towel bars as grab bars. Use non-skid mats or decals in the tub or shower. If you need to sit down in the shower, use a plastic, non-slip stool. Keep the floor dry. Clean up any water that spills on the floor as soon as it happens. Remove soap buildup in the tub or shower regularly. Attach bath mats securely with double-sided non-slip rug tape. Do not have throw rugs and other things on the floor that  can make you trip. What can I do in the bedroom? Use night lights. Make sure that you have a light by your bed that is easy to reach. Do not use any sheets or blankets that are too big for your bed. They should not hang down onto the floor. Have a firm chair that has side arms. You can use this for support while you get dressed. Do not have throw rugs and other things on the floor that can make you trip. What can I do in the kitchen? Clean up any spills right away. Avoid walking on wet floors. Keep items that you use a lot in easy-to-reach places. If you need to reach something above you, use a strong step stool that has a grab bar. Keep electrical cords out of the way. Do not use floor polish or wax that makes floors slippery. If you must use wax, use non-skid floor wax. Do not have throw rugs and other things on the floor that can make you trip. What can I do with my stairs? Do not leave any items on the stairs. Make sure that there are handrails on both sides of the stairs and use them. Fix handrails that are broken or loose. Make sure that handrails are as long as the stairways. Check any carpeting to make sure that it is firmly attached to the stairs. Fix any  carpet that is loose or worn. Avoid having throw rugs at the top or bottom of the stairs. If you do have throw rugs, attach them to the floor with carpet tape. Make sure that you have a light switch at the top of the stairs and the bottom of the stairs. If you do not have them, ask someone to add them for you. What else can I do to help prevent falls? Wear shoes that: Do not have high heels. Have rubber bottoms. Are comfortable and fit you well. Are closed at the toe. Do not wear sandals. If you use a stepladder: Make sure that it is fully opened. Do not climb a closed stepladder. Make sure that both sides of the stepladder are locked into place. Ask someone to hold it for you, if possible. Clearly mark and make sure that you  can see: Any grab bars or handrails. First and last steps. Where the edge of each step is. Use tools that help you move around (mobility aids) if they are needed. These include: Canes. Walkers. Scooters. Crutches. Turn on the lights when you go into a dark area. Replace any light bulbs as soon as they burn out. Set up your furniture so you have a clear path. Avoid moving your furniture around. If any of your floors are uneven, fix them. If there are any pets around you, be aware of where they are. Review your medicines with your doctor. Some medicines can make you feel dizzy. This can increase your chance of falling. Ask your doctor what other things that you can do to help prevent falls. This information is not intended to replace advice given to you by your health care provider. Make sure you discuss any questions you have with your health care provider. Document Released: 08/06/2009 Document Revised: 03/17/2016 Document Reviewed: 11/14/2014 Elsevier Interactive Patient Education  2017 [employer].

## 2022-04-21 ENCOUNTER — Ambulatory Visit (INDEPENDENT_AMBULATORY_CARE_PROVIDER_SITE_OTHER): Payer: Medicare Other

## 2022-04-21 VITALS — Ht 72.0 in | Wt 205.0 lb

## 2022-04-21 DIAGNOSIS — Z0000 Encounter for general adult medical examination without abnormal findings: Secondary | ICD-10-CM

## 2022-04-21 NOTE — Progress Notes (Signed)
Subjective:   Jonathon Vazquez is a 51 y.o. male who presents for an Initial Medicare Annual Wellness Visit. Virtual Visit via Telephone Note  I connected with  Jonathon Vazquez on 04/21/22 at 10:45 AM EDT by telephone and verified that I am speaking with the correct person using two identifiers.  Location: Patient: HOME Provider: [facility] Persons participating in the virtual visit: patient/Nurse Health Advisor   I discussed the limitations, risks, security and privacy concerns of performing an evaluation and management service by telephone and the availability of in person appointments. The patient expressed understanding and agreed to proceed.  Interactive audio and video telecommunications were attempted between this nurse and patient, however failed, due to patient having technical difficulties OR patient did not have access to video capability.  We continued and completed visit with audio only.  Some vital signs may be absent or patient reported.   Jonathon Driver, LPN  Review of Systems     Cardiac Risk Factors include: advanced age (>62mn, >>52women);male gender;sedentary lifestyle;Other (see comment), Risk factor comments: PDA, Chrons disease, MS.     Objective:    Today's Vitals   04/21/22 1043  Weight: 205 lb (93 kg)  Height: 6' (1.829 m)   Body mass index is 27.8 kg/m.     04/21/2022   10:47 AM 06/24/2018    1:47 AM 09/24/2015    3:55 PM 12/08/2014   11:06 AM  Advanced Directives  Does Patient Have a Medical Advance Directive? No No No No  Does patient want to make changes to medical advance directive?  No - Patient declined    Would patient like information on creating a medical advance directive? No - Patient declined No - Patient declined      Current Medications (verified) Outpatient Encounter Medications as of 04/21/2022  Medication Sig   acetaminophen (TYLENOL) 325 MG tablet Take by mouth.   fluticasone (FLONASE) 50 MCG/ACT nasal spray SPRAY 2 SPRAYS INTO  EACH NOSTRIL EVERY DAY   MULTIPLE VITAMIN PO Take by mouth.   natalizumab (TYSABRI) 300 MG/15ML injection Inject into the vein. 1 hour infusion monthly   No facility-administered encounter medications on file as of 04/21/2022.    Allergies (verified) Lialda [mesalamine]   History: Past Medical History:  Diagnosis Date   Anal fistula 1996   Anal stenosis    Atrial fibrillation ([facility]    pt and wife deny this   Colon stricture ([facility]    descending colon   Crohn's disease ([facility]    Hx of colonic polyp 10/21/2016   MRSA (methicillin resistant Staphylococcus aureus)    Multiple sclerosis ([facility])    Neuromuscular disorder ([facility]    Patent ductus arteriosus    Regional enteritis of large intestine ([facility])    S/P left hemicolectomy 2015   [facility]-done for stricture   Stricture of descending colon due to Crohn's disease 02/13/2012   Past Surgical History:  Procedure Laterality Date   COLON SURGERY  2015   descending colon stricture    COLONOSCOPY     multiple   Family History  Problem Relation Age of Onset   Heart disease Mother    Ulcerative colitis Father    Diabetes Paternal Grandmother    Prostate cancer Paternal Grandfather    Colon cancer Neg Hx    Rectal cancer Neg Hx    Stomach cancer Neg Hx    Colon polyps Neg Hx    Esophageal cancer Neg Hx    Social History  Socioeconomic History   Marital status: Married    Spouse name: Jonathon Vazquez   Number of children: 3   Years of education: 13   Highest education level: Not on file  Occupational History   Occupation: disabled  Tobacco Use   Smoking status: Former    Packs/day: 1.00    Years: 15.00    Total pack years: 15.00    Types: Cigarettes    Quit date: 10/24/2004    Years since quitting: 17.5   Smokeless tobacco: Never  Vaping Use   Vaping Use: Never used  Substance and Sexual Activity   Alcohol use: Never   Drug use: Never   Sexual activity: Yes  Other Topics Concern   Not on file  Social History Narrative    Married, 1 son one daughter. 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 Multimedia programmer)   Disabled, previously worked at [employer], disability due to multiple sclerosis   Caffeine one daily   Left handed.   Education- one year of college.   Social Determinants of Health   Financial Resource Strain: Low Risk  (04/21/2022)   Overall Financial Resource Strain (CARDIA)    Difficulty of Paying Living Expenses: Not hard at all  Food Insecurity: No Food Insecurity (04/21/2022)   Hunger Vital Sign    Worried About Running Out of Food in the Last Year: Never true    Ran Out of Food in the Last Year: Never true  Transportation Needs: No Transportation Needs (04/21/2022)   PRAPARE - Hydrologist (Medical): No    Lack of Transportation (Non-Medical): No  Physical Activity: Insufficiently Active (04/21/2022)   Exercise Vital Sign    Days of Exercise per Week: 3 days    Minutes of Exercise per Session: 30 min  Stress: No Stress Concern Present (04/21/2022)   [place]    Feeling of Stress : Not at all  Social Connections: [place] (04/21/2022)   Social Connection and Isolation Panel [NHANES]    Frequency of Communication with Friends and Family: More than three times a week    Frequency of Social Gatherings with Friends and Family: More than three times a week    Attends Religious Services: More than 4 times per year    Active Member of [employer] or Organizations: Yes    Attends Music therapist: More than 4 times per year    Marital Status: Married    Tobacco Counseling Counseling given: Not Answered   Clinical Intake:  Pre-visit preparation completed: Yes  Pain : No/denies pain     BMI - recorded: 27.8 Nutritional Status: BMI 25 -29 Overweight Nutritional Risks: None Diabetes: No  How often do you need to have someone help you when you read instructions, pamphlets, or other written materials from your  doctor or pharmacy?: 1 - Never  Diabetic?NO  Interpreter Needed?: No  Information entered by :: Jonathon Meiya Wisler, lpn   Activities of Daily Living    04/21/2022   10:49 AM  In your present state of health, do you have any difficulty performing the following activities:  Hearing? 0  Vision? 0  Difficulty concentrating or making decisions? 0  Walking or climbing stairs? 0  Dressing or bathing? 0  Doing errands, shopping? 0  Preparing Food and eating ? N  Using the Toilet? N  In the past six months, have you accidently leaked urine? N  Do you have problems with loss of bowel control? N  Managing your Medications? N  Managing your Finances? N  Housekeeping or managing your Housekeeping? N    Patient Care Team: Susy Frizzle, MD as PCP - General (Family Medicine) Gatha Mayer, MD as Consulting Physician (Gastroenterology) Marcial Pacas, MD as Consulting Physician (Neurology)  Indicate any recent Medical Services you may have received from other than [facility] providers in the past year (date may be approximate).     Assessment:   This is a routine wellness examination for [place].  Hearing/Vision screen Hearing Screening - Comments:: No hearing issues.  Vision Screening - Comments:: Glasses. [facility]. 2022.  Dietary issues and exercise activities discussed: Current Exercise Habits: Home exercise routine, Type of exercise: walking, Time (Minutes): 30, Frequency (Times/Week): 3, Weekly Exercise (Minutes/Week): 90, Intensity: Mild, Exercise limited by: neurologic condition(s);cardiac condition(s)   Goals Addressed             This Visit's Progress    Weight (lb) < 200 lb (90.7 kg)   205 lb (93 kg)    Would like to get down to 185#       Depression Screen    04/21/2022   10:46 AM 05/22/2020    2:53 PM 05/28/2018    2:54 PM 03/31/2017   12:22 PM  PHQ 2/9 Scores  PHQ - 2 Score 0 0 0 0  PHQ- 9 Score  0  1    Fall Risk    04/21/2022   10:48 AM 05/22/2020     2:53 PM 03/31/2017   12:22 PM  Fall Risk   Falls in the past year? 0 0 No  Number falls in past yr: 0 0   Injury with Fall? 0 0   Risk for fall due to : No Fall Risks    Follow up Falls prevention discussed      [place]:  Any stairs in or around the home? Yes  If so, are there any without handrails? No  Home free of loose throw rugs in walkways, pet beds, electrical cords, etc? Yes  Adequate lighting in your home to reduce risk of falls? Yes   ASSISTIVE DEVICES UTILIZED TO PREVENT FALLS:  Life alert? No  Use of a cane, walker or w/c? No  Grab bars in the bathroom? No  Shower chair or bench in shower? No  Elevated toilet seat or a handicapped toilet? Yes   TIMED UP AND GO:  Was the test performed? No .  Phone visit.  Cognitive Function:        04/21/2022   10:49 AM  6CIT Screen  What Year? 0 points  What month? 0 points  What time? 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 Yes   Zostavax completed No   Shingrix  Completed?: No.    Education has been provided regarding the importance of this vaccine. Patient has been advised to call insurance company to determine out of  pocket expense if they have not yet received this vaccine. Advised may also receive vaccine at local pharmacy or Health Dept. Verbalized acceptance and understanding.  Screening Tests Health Maintenance  Topic Date Due   COVID-19 Vaccine (3 - Moderna risk series) 05/07/2022 (Originally 03/18/2020)   Zoster Vaccines- Shingrix (1 of 2) 10/21/2022 (Originally [birth year]/1988)   INFLUENZA VACCINE  05/24/2022   COLONOSCOPY (Pts 45-74yr Insurance coverage will need to be confirmed)  08/12/2024   TETANUS/TDAP  04/30/2028   Hepatitis C Screening  Completed   HPV VACCINES  Aged Out   HIV Screening  Discontinued    Health Maintenance  There are no preventive care reminders to display for this patient.   Colorectal cancer screening: Type of screening: Colonoscopy. Completed 08/12/2021. Repeat every 3 years  Lung Cancer Screening: (Low Dose CT Chest recommended if Age 52-80years, 30 pack-year currently smoking OR have quit w/in 15years.) does not qualify.   Additional Screening:  Hepatitis C Screening: does not qualify; Completed 05/29/2020  Vision Screening: Recommended annual ophthalmology exams for early detection of glaucoma and other disorders of the eye. Is the patient up to date with their annual eye exam?  Yes  Who is the provider or what is the name of the office in which the patient attends annual eye exams[place] is not established with a provider, would they like to be referred to a provider to establish care? No .   Dental Screening: Recommended annual dental exams for proper oral hygiene  Community Resource Referral / Chronic Care Management: CRR required this visit?  No   CCM required this visit?  No      Plan:     I have personally reviewed and noted the following in the patient's chart:   Medical and social history Use of alcohol, tobacco or illicit drugs  Current medications and supplements including opioid prescriptions. Patient is not  currently taking opioid prescriptions. Functional ability and status Nutritional status Physical activity Advanced directives List of other physicians Hospitalizations, surgeries, and ER visits in previous 12 months Vitals Screenings to include cognitive, depression, and falls Referrals and appointments  In addition, I have reviewed and discussed with patient certain preventive protocols, quality metrics, and best practice recommendations. A written personalized care plan for preventive services as well as general preventive health recommendations were provided to patient.     MChriss Driver LPN   [id number]  Nurse Notes: Discussed Shingrix and how to obtain.

## 2022-06-25 ENCOUNTER — Other Ambulatory Visit: Payer: Self-pay | Admitting: Family Medicine

## 2022-08-09 ENCOUNTER — Other Ambulatory Visit: Payer: Self-pay | Admitting: Neurology

## 2022-08-09 DIAGNOSIS — G35 Multiple sclerosis: Secondary | ICD-10-CM

## 2022-08-17 ENCOUNTER — Other Ambulatory Visit (INDEPENDENT_AMBULATORY_CARE_PROVIDER_SITE_OTHER): Payer: Self-pay

## 2022-08-17 ENCOUNTER — Telehealth: Payer: Self-pay

## 2022-08-17 DIAGNOSIS — G35 Multiple sclerosis: Secondary | ICD-10-CM

## 2022-08-17 DIAGNOSIS — Z0289 Encounter for other administrative examinations: Secondary | ICD-10-CM

## 2022-08-17 NOTE — Telephone Encounter (Signed)
JCV SENT

## 2022-08-25 ENCOUNTER — Ambulatory Visit
Admission: RE | Admit: 2022-08-25 | Discharge: 2022-08-25 | Disposition: A | Discharge status: Home / Self Care | Payer: Medicare Other | Source: Ambulatory Visit | Attending: Neurology | Admitting: Neurology

## 2022-08-25 DIAGNOSIS — G35 Multiple sclerosis: Secondary | ICD-10-CM

## 2022-08-25 MED ORDER — GADOPICLENOL 0.5 MMOL/ML IV SOLN
9.0000 mL | Freq: Once | INTRAVENOUS | Status: AC | PRN
  Administered 2022-08-25: 9 mL via INTRAVENOUS

## 2022-08-31 LAB — STRATIFY JCV AB (W/ INDEX) W/ RFLX
Index Value: 0.25 — ABNORMAL HIGH
Stratify JCV (TM) Ab w/Reflex Inhibition: UNDETERMINED — AB

## 2022-08-31 LAB — RFLX STRATIFY JCV (TM) AB INHIBITION: JCV Antibody by Inhibition: NEGATIVE

## 2022-09-01 ENCOUNTER — Telehealth: Payer: Self-pay | Admitting: Neurology

## 2022-09-01 NOTE — Telephone Encounter (Signed)
October 2023 JCV 0.25 indeterminate, inhibition assay was negative.  JCV April 2023 0.26, negative.

## 2022-09-20 ENCOUNTER — Ambulatory Visit: Payer: Medicare Other | Admitting: Neurology

## 2022-09-20 ENCOUNTER — Encounter: Payer: Self-pay | Admitting: Neurology

## 2022-09-20 VITALS — BP 117/73 | HR 66 | Ht 71.0 in | Wt 200.0 lb

## 2022-09-20 DIAGNOSIS — G35 Multiple sclerosis: Secondary | ICD-10-CM | POA: Diagnosis not present

## 2022-09-20 NOTE — Patient Instructions (Signed)
At next infusion, have Kim draw a CBC CMP

## 2022-09-20 NOTE — Progress Notes (Signed)
Patient: Jonathon Vazquez Date of Birth: 10-Apr-1971  Reason for Visit: Follow up for MS History from: Patient Primary Neurologist: Dr. Krista Blue  ASSESSMENT AND PLAN 51 y.o. year old male   1.  Relapsing remitting multiple sclerosis 2.  Crohn's disease, no exacerbations -Overall doing well, no worsening symptoms -On Tysabri since December 2016 -JCV October 2023 0.25 negative  -MRI of the brain and cervical spine in November 2023 were overall stable -Continue Tysabri every 4 weeks, next infusion check CBC CMP  3.  Left wrist discomfort, finger paresthesia -NCV/EMG showed no evidence of CTS, felt most consistent with musculoskeletal etiology -Symptoms improved   4.  Chronic insomnia -On clonazepam 0.5 mg PRN  HISTORY  He was seen by his [facility] since 2000, initially presenting with facial numbness, MRI demonstrates lesions consistent with MS, however he has lost followups, was suggested Copaxone in 2004, but never followed through, he was reevaluated by Dr. Ron Parker in February of 2008, he began to have bladder incontinence, dizziness, blurry vision, unbalanced gait, repeat MRI at that time continued to demonstrate multiple foci of signal hyperintensity, involving brainstem, superior peduncle, deep white matters, there was pericallosal involvement, no enhancement, but there was positive DWIs lesion, he was started on Avenox since 06/13/2007,  he continued to have flulike illness following injection,   He also reported a history of Crohn's disease, last treatment was in 2004 with [place], he no longer has significant GI symptoms, He was treated with immunosuppressive treatment mercaptopurine in late 1990s, for 3-4 months, still had diahrrea.   He had one to 2 flareups each year,  presenting with increased dizziness, unbalanced gait, blurry vision, worsening memory trouble, often partial improvement with steroid package.   Most recent MRI cervical was in July 2011, remote demyelinating plaque at C2  and C4, no enhancing lesions.   Repeat MRI brain and cervical in Jan 2013 showed a defined hyperintensity in the spinal cord at C2 which likely represents remote age inactive demyelinating plaque. No enhancing lesions are noted. As compared with previous MRI dated 04/30/2010 the C4 spinal cord hyperintensity is no longer seen.   MRI scan of the brain showed multiple brainstem, cerebellar, corpus callosal and periventricular white matter hyperintensities consistent with multiple sclerosis. No enhancing lesions are noted. Presence of T1 black holes  andd corpus callosal atrophy indicate chronic disease. Overall no significant changes compared with MRI from 01/23/2007    He is doing very well, there was no new neurological symptoms, he is taking Avonex, mild flulike illness right out of the injection, improved by Tylenol, or aleve.  He complains of frequent bilateral frontal headaches.   We have reviewed MRI of the brain, which has demonstrated multiple periventricular and subcortical and infratentorial chronic demyelinating plaques. No acute plaques. Compared to prior MRI on 01/23/07, there are has been disease progression.   He continues to do very well, reported prior to MRI scan, he has been off of his Avonex for 4 months, due to insurance reasons, after discussed with Mr. Bickford we decided to stay on current Avonex treatment, overall only slight change in his MRI scan there is no significant clinical worsening, we will repeat MRI of the brain and cervical spine with and without contrast in one year.   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 Carlean Purl, he has been treated with prednisone,  mercaptopurine in 2011 127m daily for one year, ( which is a nucleoside metabolic inhibitor that results in cell-cycle arrest and death) and mesalamine in the past,   We decided not to proceed with [place]  Keep him on Avonex he complains of 3 weeks history of left arm numbness, since January 2015, has much improved now,   Update September 15, 2021: Patient return for electrodiagnostic study today, showed no evidence of left upper extremity neuropathy, the tenderness is more at the left radial styloid, happened after excessive use of left hand,   We again reviewed multiple MRI of the brain, cervical spine in the past, reviewed his MS history, had worsening neurological symptoms, from 20 14-20 16, correlate with worsening Crohn's disease, there was also evidence of mild progression by MRI standard  His symptoms overall has much improved with Tysabri treatment since December 2016, he tolerated infusion every 4 weeks well, JC virus titer remains at the low side, 0.25 in October 2022  Patient's and wife quoted recent biogen study for extending Tysabri infusion to every 6 weeks, to avoid long-term infusion side effect, especially the worrisome PML, despite the fact that he is at the lower risk group,  We went over published data from [facility] in April 2022 https://[email or web address]/journals/laneur/article/[email or web address]/fulltext   "We found a numerical difference in the mean number of new or newly enlarging T2 hyperintense lesions at week 72 between the once every 6 weeks and once every 4 weeks groups, which reached significance under the secondary estimand, but interpretation of statistical differences (or absence thereof) is limited because disease activity in the once every 4 weeks group was lower than expected. The safety profiles of natalizumab once every 6 weeks and once every 4 weeks were similar. Although this trial was not powered to assess differences in risk of progressive  multifocal leukoencephalopathy, the occurrence of the (asymptomatic) case underscores the importance of monitoring and risk factor consideration in all patients receiving natalizumab."  Update Mar 15, 2022 SS: Remains on Tysabri every 4 weeks, decided not to move out to every 6 weeks, since remaining stable. Left hand still gets numb only at night when lying in the bed on left side, doesn't feel weak, he falls asleep, in the morning it is tingling. Can stumble, no falls, short term memory isn't as good. Is disabled. No flares of Crohn's disease.  Doesn't take gabapentin, has if needed. Takes Klonopin PRN for sleep, usually twice a week. His wife is a nMarine scientistand keeps a close eye on him.  Update September 20, 2022 SS: JCV in October 2023 0.25 negative. Remains on Tysabri, every 4 weeks. No new symptoms. Has times of jerking of his left leg., 2 times a week, no pain, he has adjusted. Takes Klonopin if he can't sleep.  MRI of the brain in November 2023 showed no new lesions, MRI cervical spine showed chronic lesions at C2 and C4-5, with no significant change.  Disc protrusions at C5-6 and C6-7 no significant spinal stenosis. Vision is ok, wears glasses. No more numbness to left hand, sometimes pain with with movement.  REVIEW OF SYSTEMS: Out of a complete 14 system review of symptoms, the patient complains only of the following symptoms, and all other reviewed systems are negative.  See HPI  ALLERGIES: Allergies  Allergen Reactions   Lialda [Mesalamine] Diarrhea    Severe diarrhea  HOME MEDICATIONS: Outpatient Medications Prior to Visit  Medication Sig Dispense Refill   acetaminophen (TYLENOL) 325 MG tablet Take by mouth.     fluticasone (FLONASE) 50 MCG/ACT nasal spray SPRAY 2 SPRAYS INTO EACH NOSTRIL EVERY DAY 48 mL 3   MULTIPLE VITAMIN PO Take by mouth.     natalizumab (TYSABRI) 300 MG/15ML injection Inject into the vein. 1 hour infusion monthly     No facility-administered medications  prior to visit.    PAST MEDICAL HISTORY: Past Medical History:  Diagnosis Date   Anal fistula 1996   Anal stenosis    Atrial fibrillation (HCC)    pt and wife deny this   Colon stricture ([place])    descending colon   Crohn's disease ([place])    Hx of colonic polyp 10/21/2016   MRSA (methicillin resistant Staphylococcus aureus)    Multiple sclerosis (HCC)    Neuromuscular disorder ([place])    Patent ductus arteriosus    Regional enteritis of large intestine (HCC)    S/P left hemicolectomy 2015   [facility] for stricture   Stricture of descending colon due to Crohn's disease 02/13/2012    PAST SURGICAL HISTORY: Past Surgical History:  Procedure Laterality Date   COLON SURGERY  2015   descending colon stricture    COLONOSCOPY     multiple    FAMILY HISTORY: Family History  Problem Relation Age of Onset   Heart disease Mother    Ulcerative colitis Father    Diabetes Paternal Grandmother    Prostate cancer Paternal Grandfather    Colon cancer Neg Hx    Rectal cancer Neg Hx    Stomach cancer Neg Hx    Colon polyps Neg Hx    Esophageal cancer Neg Hx     SOCIAL HISTORY: Social History   Socioeconomic History   Marital status: Married    Spouse name: [name]   Number of children: 3   Years of education: 13   Highest education level: Not on file  Occupational History   Occupation: disabled  Tobacco Use   Smoking status: Former    Packs/day: 1.00    Years: 15.00    Total pack years: 15.00    Types: Cigarettes    Quit date: 10/24/2004    Years since quitting: 17.9   Smokeless tobacco: Never  Vaping Use   Vaping Use: Never used  Substance and Sexual Activity   Alcohol use: Never   Drug use: Never   Sexual activity: Yes  Other Topics Concern   Not on file  Social History Narrative   Married, 1 son one daughter. 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 Multimedia programmer)   Disabled, previously worked at [employer], disability due to multiple sclerosis   Caffeine one daily   Left handed.   Education-  one year of college.   Social Determinants of Health   Financial Resource Strain: Low Risk  (04/21/2022)   Overall Financial Resource Strain (CARDIA)    Difficulty of Paying Living Expenses: Not hard at all  Food Insecurity: No Food Insecurity (04/21/2022)   Hunger Vital Sign    Worried About Running Out of Food in the Last Year: Never true    Ran Out of Food in the Last Year: Never true  Transportation Needs: No Transportation Needs (04/21/2022)   PRAPARE - Hydrologist (Medical): No    Lack of Transportation (Non-Medical): No  Physical Activity: Insufficiently Active (04/21/2022)   Exercise Vital Sign    Days of Exercise per Week:  3 days    Minutes of Exercise per Session: 30 min  Stress: No Stress Concern Present (04/21/2022)   [place]    Feeling of Stress : Not at all  Social Connections: [place] (04/21/2022)   Social Connection and Isolation Panel [NHANES]    Frequency of Communication with Friends and Family: More than three times a week    Frequency of Social Gatherings with Friends and Family: More than three times a week    Attends Religious Services: More than 4 times per year    Active Member of [employer] or Organizations: Yes    Attends Archivist Meetings: More than 4 times per year    Marital Status: Married  Human resources officer Violence: Not At Risk (04/21/2022)   Humiliation, Afraid, Rape, and Kick questionnaire    Fear of Current or Ex-Partner: No    Emotionally Abused: No    Physically Abused: No    Sexually Abused: No    PHYSICAL EXAM  Vitals:   09/20/22 1424  BP: 117/73  Pulse: 66  Weight: 200 lb (90.7 kg)  Height: 5' 11"  (1.803 m)   Body mass index is 27.89 kg/m.  Generalized: Well developed, in no acute distress  Neurological examination  Mentation: Alert oriented to time, place, history taking. Follows all commands speech and language  fluent Cranial nerve II-XII: Pupils were equal round reactive to light. Extraocular movements were full, visual field were full on confrontational test. Facial sensation and strength were normal. Head turning and shoulder shrug  were normal and symmetric. Motor: Good strength overall, slight decrease to right arm potentially from shoulder Sensory: Sensory testing is intact to soft touch on all 4 extremities. No evidence of extinction is noted.  Coordination: Cerebellar testing reveals good finger-nose-finger and heel-to-shin bilaterally.  Gait and station: Gait is normal. Tandem gait is slightly unsteady. Reflexes: Deep tendon reflexes are symmetric and normal bilaterally.   DIAGNOSTIC DATA (LABS, IMAGING, TESTING) - I reviewed patient records, labs, notes, testing and imaging myself where available.  Lab Results  Component Value Date   WBC 6.0 03/11/2022   HGB 14.2 03/11/2022   HCT 42.8 03/11/2022   MCV 84.6 03/11/2022   PLT 140 03/11/2022      Component Value Date/Time   NA 140 03/11/2022 0811   NA 138 08/22/2019 1515   K 4.3 03/11/2022 0811   CL 103 03/11/2022 0811   CO2 28 03/11/2022 0811   GLUCOSE 87 03/11/2022 0811   BUN 12 03/11/2022 0811   BUN 12 08/22/2019 1515   CREATININE 1.10 03/11/2022 0811   CALCIUM 9.0 03/11/2022 0811   PROT 6.7 03/11/2022 0811   PROT 6.8 08/22/2019 1515   ALBUMIN 4.2 08/22/2019 1515   AST 20 03/11/2022 0811   ALT 18 03/11/2022 0811   ALKPHOS 81 08/22/2019 1515   BILITOT 1.1 03/11/2022 0811   BILITOT 1.1 08/22/2019 1515   GFRNONAA 75 05/29/2020 0823   GFRAA 87 05/29/2020 0823   Lab Results  Component Value Date   CHOL 193 03/11/2022   HDL 48 03/11/2022   LDLCALC 125 (H) 03/11/2022   TRIG 97 03/11/2022   CHOLHDL 4.0 03/11/2022   No results found for: "HGBA1C" No results found for: "VITAMINB12" Lab Results  Component Value Date   TSH 1.420 08/22/2019    Butler Denmark, AGNP-C, DNP 09/20/2022, 2:46 PM [facility] [address] [place], [place] [address] [phone]

## 2022-10-14 ENCOUNTER — Telehealth: Payer: Self-pay | Admitting: Neurology

## 2022-10-14 NOTE — Telephone Encounter (Signed)
Pt said, received a call from [facility] requesting Dr. Krista Blue send an authorization for natalizumab (TYSABRI) 300 MG/15ML injection. Would like a call back.

## 2022-10-19 ENCOUNTER — Encounter: Payer: Self-pay | Admitting: Neurology

## 2022-10-19 NOTE — Telephone Encounter (Signed)
Will monitor for pw.

## 2022-10-25 NOTE — Telephone Encounter (Signed)
PW not received, however intrafusion is following this for this.

## 2022-10-26 NOTE — Telephone Encounter (Signed)
This was on my to do list, has anything been received for this pt since he sent [place]? Im assuming he meant a PA for the Tysabri .Marland Kitchen

## 2022-11-07 ENCOUNTER — Ambulatory Visit: Payer: Medicare Other | Admitting: Family Medicine

## 2022-11-07 ENCOUNTER — Encounter: Payer: Self-pay | Admitting: Family Medicine

## 2022-11-07 VITALS — BP 120/62 | HR 64 | Ht 71.0 in | Wt 202.4 lb

## 2022-11-07 DIAGNOSIS — M7591 Shoulder lesion, unspecified, right shoulder: Secondary | ICD-10-CM

## 2022-11-07 NOTE — Progress Notes (Signed)
Subjective:    Patient ID: Jonathon Vazquez, male    DOB: 05-18-1971, 52 y.o.   MRN: [id number]  HPI Patient is a 52 year old African-American male with past medical history is significant for multiple sclerosis as well as Crohn's colitis on chronic immunosuppressive therapy.  Patient presents with several months of pain in his right shoulder.  Initially it was just aching and keeping him awake at night.  Now he is having pain when he abducts his arm greater than 90 degrees.  He has a positive empty can sign today.  He has a positive [place] sign.  He has pain with passive and active abduction of the shoulder.  He is here today to discuss options.  He has been trying topical NSAIDs as well as oral NSAIDs with minimal relief  Past Medical History:  Diagnosis Date   Anal fistula 1996   Anal stenosis    Atrial fibrillation (HCC)    pt and wife deny this   Colon stricture ([place])    descending colon   Crohn's disease ([place])    Hx of colonic polyp 10/21/2016   MRSA (methicillin resistant Staphylococcus aureus)    Multiple sclerosis ([place])    Neuromuscular disorder ([place])    Patent ductus arteriosus    Regional enteritis of large intestine ([place])    S/P left hemicolectomy 2015   [facility]-done for stricture   Stricture of descending colon due to Crohn's disease 02/13/2012   Past Surgical History:  Procedure Laterality Date   COLON SURGERY  2015   descending colon stricture    COLONOSCOPY     multiple   Current Outpatient Medications on File Prior to Visit  Medication Sig Dispense Refill   acetaminophen (TYLENOL) 325 MG tablet Take by mouth.     fluticasone (FLONASE) 50 MCG/ACT nasal spray SPRAY 2 SPRAYS INTO EACH NOSTRIL EVERY DAY 48 mL 3   MULTIPLE VITAMIN PO Take by mouth.     natalizumab (TYSABRI) 300 MG/15ML injection Inject into the vein. 1 hour infusion monthly     No current facility-administered medications on file prior to visit.   Allergies  Allergen Reactions   Lialda [Mesalamine]  Diarrhea    Severe diarrhea   Social History   Socioeconomic History   Marital status: Married    Spouse name: [name]   Number of children: 3   Years of education: 13   Highest education level: Not on file  Occupational History   Occupation: disabled  Tobacco Use   Smoking status: Former    Packs/day: 1.00    Years: 15.00    Total pack years: 15.00    Types: Cigarettes    Quit date: 10/24/2004    Years since quitting: 18.0   Smokeless tobacco: Never  Vaping Use   Vaping Use: Never used  Substance and Sexual Activity   Alcohol use: Never   Drug use: Never   Sexual activity: Yes  Other Topics Concern   Not on file  Social History Narrative   Married, 1 son one daughter. 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 Multimedia programmer)   Disabled, previously worked at [employer], disability due to multiple sclerosis   Caffeine one daily   Left handed.   Education- one year of college.   Social Determinants of Health   Financial Resource Strain: Low Risk  (04/21/2022)   Overall Financial Resource Strain (CARDIA)    Difficulty of Paying Living Expenses: Not hard at all  Food Insecurity: No Food Insecurity (04/21/2022)   Hunger Vital Sign  Worried About Charity fundraiser in the Last Year: Never true    [place] in the Last Year: Never true  Transportation Needs: No Transportation Needs (04/21/2022)   PRAPARE - Hydrologist (Medical): No    Lack of Transportation (Non-Medical): No  Physical Activity: Insufficiently Active (04/21/2022)   Exercise Vital Sign    Days of Exercise per Week: 3 days    Minutes of Exercise per Session: 30 min  Stress: No Stress Concern Present (04/21/2022)   [place]    Feeling of Stress : Not at all  Social Connections: [place] (04/21/2022)   Social Connection and Isolation Panel [NHANES]    Frequency of Communication with Friends and Family: More than three times a  week    Frequency of Social Gatherings with Friends and Family: More than three times a week    Attends Religious Services: More than 4 times per year    Active Member of [employer] or Organizations: Yes    Attends Music therapist: More than 4 times per year    Marital Status: Married  Human resources officer Violence: Not At Risk (04/21/2022)   Humiliation, Afraid, Rape, and Kick questionnaire    Fear of Current or Ex-Partner: No    Emotionally Abused: No    Physically Abused: No    Sexually Abused: No   Family History  Problem Relation Age of Onset   Heart disease Mother    Ulcerative colitis Father    Diabetes Paternal Grandmother    Prostate cancer Paternal Grandfather    Colon cancer Neg Hx    Rectal cancer Neg Hx    Stomach cancer Neg Hx    Colon polyps Neg Hx    Esophageal cancer Neg Hx      Review of Systems  All other systems reviewed and are negative.      Objective:   Physical Exam Vitals reviewed.  Constitutional:      General: He is not in acute distress.    Appearance: He is well-developed. He is not diaphoretic.  HENT:     Head: Normocephalic and atraumatic.  Neck:     Thyroid: No thyromegaly.     Vascular: No JVD.     Trachea: No tracheal deviation.  Cardiovascular:     Rate and Rhythm: Normal rate and regular rhythm.     Heart sounds: Normal heart sounds. No murmur heard.    No friction rub. No gallop.  Pulmonary:     Effort: Pulmonary effort is normal. No respiratory distress.     Breath sounds: Normal breath sounds. No stridor. No wheezing or rales.  Chest:     Chest wall: No tenderness.  Genitourinary:    Penis: Normal.      Testes: Normal. Cremasteric reflex is present.        Right: Mass not present.        Left: Mass not present.  Musculoskeletal:        General: Tenderness present.     Right shoulder: Tenderness present. Decreased range of motion. Normal strength.     Cervical back: Normal range of motion and neck supple.   Neurological:     Mental Status: He is alert.     Motor: No abnormal muscle tone.     Deep Tendon Reflexes: Reflexes are normal and symmetric.       Assessment & Plan:   Right supraspinatus tendinitis  Patient has tendinitis in his right shoulder coupled with subacromial bursitis.  I offered the patient a cortisone injection today versus a referral to physical therapy and/or orthopedics.  We discussed all of his options.  Patient would like to discuss this with his wife and then will let me know what his decision is.

## 2022-11-18 ENCOUNTER — Encounter: Payer: Self-pay | Admitting: Family Medicine

## 2022-11-21 ENCOUNTER — Other Ambulatory Visit: Payer: Self-pay | Admitting: Family Medicine

## 2022-11-21 DIAGNOSIS — M7580 Other shoulder lesions, unspecified shoulder: Secondary | ICD-10-CM

## 2022-11-24 NOTE — Therapy (Signed)
OUTPATIENT PHYSICAL THERAPY  EVALUATION   Patient Name: Jonathon Vazquez MRN: [id number] DOB:03-Jun-1971, 52 y.o., male Today's Date: 11/28/2022  END OF SESSION:  PT End of Session - 11/28/22 1426     Visit Number 1    Number of Visits 20    Date for PT Re-Evaluation 02/06/23    Authorization Type BC[employer] Medicare $25 copay    Progress Note Due on Visit 10    PT Start Time 1430    PT Stop Time 1500    PT Time Calculation (min) 30 min    Activity Tolerance Patient tolerated treatment well    Behavior During Therapy WFL for tasks assessed/performed             Past Medical History:  Diagnosis Date   Anal fistula 1996   Anal stenosis    Atrial fibrillation ([place])    pt and wife deny this   Colon stricture ([place])    descending colon   Crohn's disease ([place])    Hx of colonic polyp 10/21/2016   MRSA (methicillin resistant Staphylococcus aureus)    Multiple sclerosis ([place])    Neuromuscular disorder ([place])    Patent ductus arteriosus    Regional enteritis of large intestine ([place])    S/P left hemicolectomy 2015   [facility]-done for stricture   Stricture of descending colon due to Crohn's disease 02/13/2012   Past Surgical History:  Procedure Laterality Date   COLON SURGERY  2015   descending colon stricture    COLONOSCOPY     multiple   Patient Active Problem List   Diagnosis Date Noted   Left wrist pain 09/15/2021   Relapsing remitting multiple sclerosis ([place]) 09/09/2021   High risk medication use 09/09/2021   Crohn's disease of small intestine without complication ([place]) 86/57/8469   Cellulitis 06/24/2018   Patent ductus arteriosus    MRSA (methicillin resistant Staphylococcus aureus)    Crohn's disease ([place])    Therapeutic drug monitoring 06/13/2017   Encounter for therapeutic drug monitoring 02/09/2017   Visual changes 12/07/2016   Insomnia 12/07/2016   Hx of colonic polyp 10/21/2016   PDA (patent ductus arteriosus) 03/01/2012   Multiple sclerosis ([place]) 12/11/2008    Crohn's colitis- hx strictures and resection 12/11/2008    PCP: Susy Frizzle, MD  REFERRING PROVIDER: Susy Frizzle, MD  REFERRING DIAG: M75.80 (ICD-10-CM) - Rotator cuff tendinitis, unspecified laterality  THERAPY DIAG:  Chronic right shoulder pain  Muscle weakness (generalized)  Rationale for Evaluation and Treatment: Rehabilitation  ONSET DATE: 10/2022  SUBJECTIVE:  SUBJECTIVE STATEMENT: He indicated off and on trouble for at least a month.  He mentioned some medicine helps but activity and lifting overhead brings pain back.  He also indicated trouble sleeping on Rt side and arms overhead positioning.   Insidious onset of symptoms.   Denied similar history of symptoms.   PERTINENT HISTORY: Crohn's Disease, MS (see chart list)  PAIN:  NPRS scale: at worst 8/10, at current today 5/10 Pain location: Rt shoulder, anterior and top of shoulder at times.  Pain description: sharp Aggravating factors: lifting, reaching overhead, increased activity Relieving factors: relative rest  PRECAUTIONS: None  WEIGHT BEARING RESTRICTIONS: No  FALLS:  Has patient fallen in last 6 months? No  LIVING ENVIRONMENT: Lives in: Lives home/apartment  OCCUPATION: Disability  PLOF: Independent, Lt hand dominant, going to softball games for family member.  Goes to gym 2x/week typically for whole body exercise.  Limited due to Rt arm symptoms.   PATIENT GOALS:Reduce pain, get more exercise.   Next MD visit:   OBJECTIVE:   PATIENT SURVEYS:  11/28/2022 FOTO intake:  64   predicted:  72  COGNITION: 11/28/2022 Overall cognitive status: WFL     SENSATION: 11/28/2022 No specific limitations.   POSTURE: 11/28/2022 Mild rounded shoulders   UPPER EXTREMITY ROM:   ROM Right 11/28/2022 Left 11/28/2022   Shoulder flexion 150 c painful WFL  Shoulder extension    Shoulder abduction    Shoulder adduction    Shoulder internal rotation Muscle guarding noted but able to reach [facility]  Shoulder external rotation [facility] c pain end range  [facility]  Elbow flexion    Elbow extension    Wrist flexion    Wrist extension    Wrist ulnar deviation    Wrist radial deviation    Wrist pronation    Wrist supination    (Blank rows = not tested)  UPPER EXTREMITY MMT:  MMT Right 11/28/2022 Left 11/28/2022  Shoulder flexion 4/5 c pain 5/5  Shoulder extension    Shoulder abduction 5/5 5/5  Shoulder adduction    Shoulder internal rotation 5/5 5/5  Shoulder external rotation 5/5 5/5  Middle trapezius    Lower trapezius    Elbow flexion 4+/5 c pain 5/5  Elbow extension 5/5 5/5  Wrist flexion    Wrist extension    Wrist ulnar deviation    Wrist radial deviation    Wrist pronation    Wrist supination    Grip strength (lbs)    (Blank rows = not tested)  SHOULDER SPECIAL TESTS: 11/28/2022 (+) Rt painful arc, empty can for pain but not weakness, hawkins kennedy (-) Rt speed testing  JOINT MOBILITY TESTING:  11/28/2022 Unremarkable   PALPATION:  11/28/2022 Trigger points c concordant symptoms with Rt proximal biceps tendon long head, Rt anterior deltoid Trigger points, Rt infraspinatus trigger points.    TODAY'S TREATMENT:                                                                             DATE: 11/28/2022 Therex:    HEP instruction/performance c cues for techniques, handout provided.  Trial set performed of each for comprehension and symptom assessment.  See below for exercise list  PATIENT EDUCATION: 11/28/2022 Education details: HEP, POC Person educated: Patient Education method: Consulting civil engineer, Demonstration, Verbal cues, and Handouts Education comprehension: verbalized understanding, returned demonstration, and verbal cues required  HOME EXERCISE PROGRAM: Access Code: [email or web address] URL:  [email or web address] Date: 11/28/2022 Prepared by: Scot Jun  Exercises - Seated Scapular Retraction  - 3-5 x daily - 7 x weekly - 1 sets - 10 reps - 3-5 hold - Standing Isometric Shoulder External Rotation with Doorway (Mirrored)  - 2-3 x daily - 7 x weekly - 1 sets - 10 reps - 5 hold - Standing Bilateral Low Shoulder Row with Anchored Resistance  - 1-2 x daily - 7 x weekly - 2-3 sets - 10-15 reps - Shoulder Extension with Resistance  - 1-2 x daily - 7 x weekly - 1-2 sets - 10-15 reps  Instruction for tennis ball self pressure for trigger points.   ASSESSMENT:  CLINICAL IMPRESSION: Patient is a 52 y.o. who comes to clinic with complaints of Rt shoulder pain with mobility, strength and movement coordination deficits that impair their ability to perform usual daily and recreational functional activities without increase difficulty/symptoms at this time.  Patient to benefit from skilled PT services to address impairments and limitations to improve to previous level of function without restriction secondary to condition.   OBJECTIVE IMPAIRMENTS: decreased coordination, decreased endurance, decreased mobility, decreased strength, increased fascial restrictions, impaired perceived functional ability, impaired flexibility, impaired UE functional use, and pain.   ACTIVITY LIMITATIONS: carrying, lifting, sleeping, and reach over head  PARTICIPATION LIMITATIONS: cleaning, laundry, and community activity  PERSONAL FACTORS:  no specific limitations  are also affecting patient's functional outcome.   REHAB POTENTIAL: Good  CLINICAL DECISION MAKING: Stable/uncomplicated  EVALUATION COMPLEXITY: Low   GOALS: Goals reviewed with patient? Yes  SHORT TERM GOALS: (target date for Short term goals are 3 weeks 12/19/2022)  1.Patient will demonstrate independent use of home exercise program to maintain progress from in clinic treatments. Goal status: New  LONG TERM GOALS: (target  dates for all long term goals are 10 weeks  02/06/2023 )   1. Patient will demonstrate/report pain at worst less than or equal to 2/10 to facilitate minimal limitation in daily activity secondary to pain symptoms. Goal status: New   2. Patient will demonstrate independent use of home exercise program to facilitate ability to maintain/progress functional gains from skilled physical therapy services. Goal status: New   3. Patient will demonstrate FOTO outcome > or = 72 % to indicate reduced disability due to condition. Goal status: New   4.  Patient will demonstrate Rt UE MMT 5/5 throughout to facilitate lifting, reaching, carrying at [facility] in daily activity.   Goal status: New   5.  Patient will demonstrate  [facility] joint AROM WFL s symptoms to facilitate usual overhead reaching, self care, dressing at PLOF.    Goal status: New   6.  Patient will demonstrate/report ability to lift/carry around household items at [facility] s limitation.  Goal status: New   7.  Patient will demonstrate/report sleep unrestricted due to symptoms.  Goal Status: New  PLAN:  PT FREQUENCY: 1-2x/week  PT DURATION: 10 weeks  PLANNED INTERVENTIONS: Therapeutic exercises, Therapeutic activity, Neuro Muscular re-education, Balance training, Gait training, Patient/Family education, Joint mobilization, Stair training, DME instructions, Dry Needling, Electrical stimulation, Traction, Cryotherapy, vasopneumatic device Moist heat, Taping, Ultrasound, Ionotophoresis '4mg'$ /ml Dexamethasone, and Manual therapy.  All included unless contraindicated  PLAN FOR NEXT SESSION: Review HEP knowledge/results.  Possible dry needling or myofascial release for infraspinatus  and possibly anterior deltoid.  Progress strengthening as tolerated.    Scot Jun, PT, DPT, OCS, ATC 11/28/22  3:47 PM

## 2022-11-28 ENCOUNTER — Ambulatory Visit: Payer: Medicare Other | Admitting: Rehabilitative and Restorative Service Providers"

## 2022-11-28 ENCOUNTER — Encounter: Payer: Self-pay | Admitting: Rehabilitative and Restorative Service Providers"

## 2022-11-28 ENCOUNTER — Other Ambulatory Visit: Payer: Self-pay

## 2022-11-28 DIAGNOSIS — M6281 Muscle weakness (generalized): Secondary | ICD-10-CM | POA: Diagnosis not present

## 2022-11-28 DIAGNOSIS — M25511 Pain in right shoulder: Secondary | ICD-10-CM | POA: Diagnosis not present

## 2022-11-28 DIAGNOSIS — G8929 Other chronic pain: Secondary | ICD-10-CM | POA: Diagnosis not present

## 2022-12-02 ENCOUNTER — Encounter: Payer: Self-pay | Admitting: Rehabilitative and Restorative Service Providers"

## 2022-12-02 ENCOUNTER — Ambulatory Visit: Payer: Medicare Other | Admitting: Rehabilitative and Restorative Service Providers"

## 2022-12-02 DIAGNOSIS — M6281 Muscle weakness (generalized): Secondary | ICD-10-CM

## 2022-12-02 DIAGNOSIS — G8929 Other chronic pain: Secondary | ICD-10-CM

## 2022-12-02 DIAGNOSIS — M25511 Pain in right shoulder: Secondary | ICD-10-CM | POA: Diagnosis not present

## 2022-12-02 NOTE — Therapy (Signed)
OUTPATIENT PHYSICAL THERAPY  TREATMENT   Patient Name: Jonathon Vazquez MRN: [id number] DOB:05/16/1971, 52 y.o., male Today's Date: 12/02/2022  END OF SESSION:  PT End of Session - 12/02/22 0803     Visit Number 2    Number of Visits 20    Date for PT Re-Evaluation 02/06/23    Authorization Type BCBS Medicare $25 copay    Progress Note Due on Visit 10    PT Start Time 0759    PT Stop Time 0827    PT Time Calculation (min) 28 min    Activity Tolerance Patient tolerated treatment well    Behavior During Therapy [facility] for tasks assessed/performed              Past Medical History:  Diagnosis Date   Anal fistula 1996   Anal stenosis    Atrial fibrillation ([place])    pt and wife deny this   Colon stricture ([place])    descending colon   Crohn's disease ([place])    Hx of colonic polyp 10/21/2016   MRSA (methicillin resistant Staphylococcus aureus)    Multiple sclerosis ([place])    Neuromuscular disorder ([place])    Patent ductus arteriosus    Regional enteritis of large intestine ([place])    S/P left hemicolectomy 2015   [facility]-done for stricture   Stricture of descending colon due to Crohn's disease 02/13/2012   Past Surgical History:  Procedure Laterality Date   COLON SURGERY  2015   descending colon stricture    COLONOSCOPY     multiple   Patient Active Problem List   Diagnosis Date Noted   Left wrist pain 09/15/2021   Relapsing remitting multiple sclerosis ([place]) 09/09/2021   High risk medication use 09/09/2021   Crohn's disease of small intestine without complication ([place]) 0000000   Cellulitis 06/24/2018   Patent ductus arteriosus    MRSA (methicillin resistant Staphylococcus aureus)    Crohn's disease ([place])    Therapeutic drug monitoring 06/13/2017   Encounter for therapeutic drug monitoring 02/09/2017   Visual changes 12/07/2016   Insomnia 12/07/2016   Hx of colonic polyp 10/21/2016   PDA (patent ductus arteriosus) 03/01/2012   Multiple sclerosis ([place]) 12/11/2008    Crohn's colitis- hx strictures and resection 12/11/2008    PCP: Susy Frizzle, MD  REFERRING PROVIDER: Susy Frizzle, MD  REFERRING DIAG: M75.80 (ICD-10-CM) - Rotator cuff tendinitis, unspecified laterality  THERAPY DIAG:  Chronic right shoulder pain  Muscle weakness (generalized)  Rationale for Evaluation and Treatment: Rehabilitation  ONSET DATE: 10/2022  SUBJECTIVE:  SUBJECTIVE STATEMENT: Indicated improvement, symptoms at 5/10.  He indicated HEP have been helpful.  Ready to try needling today.   PERTINENT HISTORY: Crohn's Disease, MS (see chart list)  PAIN:  NPRS scale: 5/10  Pain location: Rt shoulder, anterior and top of shoulder at times.  Pain description: sharp Aggravating factors: lifting, reaching overhead, increased activity Relieving factors: relative rest  PRECAUTIONS: None  WEIGHT BEARING RESTRICTIONS: No  FALLS:  Has patient fallen in last 6 months? No  LIVING ENVIRONMENT: Lives in: Lives home/apartment  OCCUPATION: Disability  PLOF: Independent, Lt hand dominant, going to softball games for family member.  Goes to gym 2x/week typically for whole body exercise.  Limited due to Rt arm symptoms.   PATIENT GOALS:Reduce pain, get more exercise.   Next MD visit:   OBJECTIVE:   PATIENT SURVEYS:  11/28/2022 FOTO intake:  64   predicted:  72  COGNITION: 11/28/2022 Overall cognitive status: WFL     SENSATION: 11/28/2022 No specific limitations.   POSTURE: 11/28/2022 Mild rounded shoulders   UPPER EXTREMITY ROM:   ROM Right 11/28/2022 Left 11/28/2022  Shoulder flexion 150 c painful WFL  Shoulder extension    Shoulder abduction    Shoulder adduction    Shoulder internal rotation Muscle guarding noted but able to reach [facility]  Shoulder external rotation [facility] c  pain end range  [facility]  Elbow flexion    Elbow extension    Wrist flexion    Wrist extension    Wrist ulnar deviation    Wrist radial deviation    Wrist pronation    Wrist supination    (Blank rows = not tested)  UPPER EXTREMITY MMT:  MMT Right 11/28/2022 Left 11/28/2022  Shoulder flexion 4/5 c pain 5/5  Shoulder extension    Shoulder abduction 5/5 5/5  Shoulder adduction    Shoulder internal rotation 5/5 5/5  Shoulder external rotation 5/5 5/5  Middle trapezius    Lower trapezius    Elbow flexion 4+/5 c pain 5/5  Elbow extension 5/5 5/5  Wrist flexion    Wrist extension    Wrist ulnar deviation    Wrist radial deviation    Wrist pronation    Wrist supination    Grip strength (lbs)    (Blank rows = not tested)  SHOULDER SPECIAL TESTS: 11/28/2022 (+) Rt painful arc, empty can for pain but not weakness, hawkins kennedy (-) Rt speed testing  JOINT MOBILITY TESTING:  11/28/2022 Unremarkable   PALPATION:  11/28/2022 Trigger points c concordant symptoms with Rt proximal biceps tendon long head, Rt anterior deltoid Trigger points, Rt infraspinatus trigger points.    TODAY'S TREATMENT:                                                                             DATE: 12/02/2022 Manual: Compression to Rt infraspinatus, skilled palpation with dry needling.   Trigger Point Dry-Needling  Treatment instructions: Expect mild to moderate muscle soreness. S/S of pneumothorax if dry needled over a lung field, and to seek immediate medical attention should they occur. Patient verbalized understanding of these instructions and education.  Patient Consent Given: Yes Education handout provided: Previously provided Muscles treated: Rt infraspinatus Treatment response/outcome: concordant symptoms  c twitch response.  No adverse reactions.    Therex: UBE fwd/back 3 mins each way lvl 2.0 Seated cross arm stretch 15 sec x 3 Rt Standing Green band Rt shoulder ER c towel at side 2 x 10 Blue band  rows 2 x 15 bilateral Blue band GH ext 2 x 15 bilateral      TODAY'S TREATMENT:                                                                             DATE: 11/28/2022 Therex:    HEP instruction/performance c cues for techniques, handout provided.  Trial set performed of each for comprehension and symptom assessment.  See below for exercise list   PATIENT EDUCATION: 11/28/2022 Education details: HEP, POC Person educated: Patient Education method: Explanation, Demonstration, Verbal cues, and Handouts Education comprehension: verbalized understanding, returned demonstration, and verbal cues required  HOME EXERCISE PROGRAM: Access Code: [email or web address] URL: [email or web address] Date: 12/02/2022 Prepared by: Scot Jun  Exercises - Seated Scapular Retraction  - 3-5 x daily - 7 x weekly - 1 sets - 10 reps - 3-5 hold - Standing Isometric Shoulder External Rotation with Doorway (Mirrored)  - 2-3 x daily - 7 x weekly - 1 sets - 10 reps - 5 hold - Standing Bilateral Low Shoulder Row with Anchored Resistance  - 1-2 x daily - 7 x weekly - 2-3 sets - 10-15 reps - Shoulder Extension with Resistance  - 1-2 x daily - 7 x weekly - 1-2 sets - 10-15 reps - Shoulder External Rotation with Anchored Resistance (Mirrored)  - 2 x daily - 7 x weekly - 2-3 sets - 10-15 reps  ASSESSMENT:  CLINICAL IMPRESSION: Good initial response from dry needling today (concordant symptom noted) as well as improvements in sleeping following initial HEP.  Pt to continue to benefit from skilled PT services.  Reassess response to dry needling.   OBJECTIVE IMPAIRMENTS: decreased coordination, decreased endurance, decreased mobility, decreased strength, increased fascial restrictions, impaired perceived functional ability, impaired flexibility, impaired UE functional use, and pain.   ACTIVITY LIMITATIONS: carrying, lifting, sleeping, and reach over head  PARTICIPATION LIMITATIONS: cleaning, laundry, and  community activity  PERSONAL FACTORS:  no specific limitations  are also affecting patient's functional outcome.   REHAB POTENTIAL: Good  CLINICAL DECISION MAKING: Stable/uncomplicated  EVALUATION COMPLEXITY: Low   GOALS: Goals reviewed with patient? Yes  SHORT TERM GOALS: (target date for Short term goals are 3 weeks 12/19/2022)  1.Patient will demonstrate independent use of home exercise program to maintain progress from in clinic treatments. Goal status: on going 12/02/2022  LONG TERM GOALS: (target dates for all long term goals are 10 weeks  02/06/2023 )   1. Patient will demonstrate/report pain at worst less than or equal to 2/10 to facilitate minimal limitation in daily activity secondary to pain symptoms. Goal status: New   2. Patient will demonstrate independent use of home exercise program to facilitate ability to maintain/progress functional gains from skilled physical therapy services. Goal status: New   3. Patient will demonstrate FOTO outcome > or = 72 % to indicate reduced disability due to condition. Goal status: New   4.  Patient will demonstrate Rt UE MMT 5/5  throughout to facilitate lifting, reaching, carrying at [facility] in daily activity.   Goal status: New   5.  Patient will demonstrate  [facility] joint AROM WFL s symptoms to facilitate usual overhead reaching, self care, dressing at PLOF.    Goal status: New   6.  Patient will demonstrate/report ability to lift/carry around household items at [facility] s limitation.  Goal status: New   7.  Patient will demonstrate/report sleep unrestricted due to symptoms.  Goal Status: New  PLAN:  PT FREQUENCY: 1-2x/week  PT DURATION: 10 weeks  PLANNED INTERVENTIONS: Therapeutic exercises, Therapeutic activity, Neuro Muscular re-education, Balance training, Gait training, Patient/Family education, Joint mobilization, Stair training, DME instructions, Dry Needling, Electrical stimulation, Traction, Cryotherapy, vasopneumatic device  Moist heat, Taping, Ultrasound, Ionotophoresis 80m/ml Dexamethasone, and Manual therapy.  All included unless contraindicated  PLAN FOR NEXT SESSION: Dry needling as desired.  Progressive strengthening.    MScot Jun PT, DPT, OCS, ATC 12/02/22  8:28 AM

## 2022-12-13 ENCOUNTER — Ambulatory Visit: Payer: Medicare Other | Admitting: Physical Therapy

## 2022-12-13 ENCOUNTER — Encounter: Payer: Self-pay | Admitting: Physical Therapy

## 2022-12-13 DIAGNOSIS — M6281 Muscle weakness (generalized): Secondary | ICD-10-CM

## 2022-12-13 DIAGNOSIS — G8929 Other chronic pain: Secondary | ICD-10-CM | POA: Diagnosis not present

## 2022-12-13 DIAGNOSIS — M25511 Pain in right shoulder: Secondary | ICD-10-CM

## 2022-12-13 NOTE — Therapy (Signed)
OUTPATIENT PHYSICAL THERAPY  TREATMENT   Patient Name: Jonathon Vazquez MRN: [id number] DOB:11-Dec-1970, 52 y.o., male Today's Date: 12/13/2022  END OF SESSION:  PT End of Session - 12/13/22 1526     Visit Number 3    Number of Visits 20    Date for PT Re-Evaluation 02/06/23    Authorization Type BCBS Medicare $25 copay    Progress Note Due on Visit 10    PT Start Time 1515    PT Stop Time 1545    PT Time Calculation (min) 30 min    Activity Tolerance Patient tolerated treatment well    Behavior During Therapy [facility] for tasks assessed/performed              Past Medical History:  Diagnosis Date   Anal fistula 1996   Anal stenosis    Atrial fibrillation ([place])    pt and wife deny this   Colon stricture ([place])    descending colon   Crohn's disease ([place])    Hx of colonic polyp 10/21/2016   MRSA (methicillin resistant Staphylococcus aureus)    Multiple sclerosis ([place])    Neuromuscular disorder ([place])    Patent ductus arteriosus    Regional enteritis of large intestine ([place])    S/P left hemicolectomy 2015   [facility]-done for stricture   Stricture of descending colon due to Crohn's disease 02/13/2012   Past Surgical History:  Procedure Laterality Date   COLON SURGERY  2015   descending colon stricture    COLONOSCOPY     multiple   Patient Active Problem List   Diagnosis Date Noted   Left wrist pain 09/15/2021   Relapsing remitting multiple sclerosis ([place]) 09/09/2021   High risk medication use 09/09/2021   Crohn's disease of small intestine without complication ([place]) 0000000   Cellulitis 06/24/2018   Patent ductus arteriosus    MRSA (methicillin resistant Staphylococcus aureus)    Crohn's disease ([place])    Therapeutic drug monitoring 06/13/2017   Encounter for therapeutic drug monitoring 02/09/2017   Visual changes 12/07/2016   Insomnia 12/07/2016   Hx of colonic polyp 10/21/2016   PDA (patent ductus arteriosus) 03/01/2012   Multiple sclerosis ([place]) 12/11/2008    Crohn's colitis- hx strictures and resection 12/11/2008    PCP: Susy Frizzle, MD  REFERRING PROVIDER: Susy Frizzle, MD  REFERRING DIAG: M75.80 (ICD-10-CM) - Rotator cuff tendinitis, unspecified laterality  THERAPY DIAG:  Chronic right shoulder pain  Muscle weakness (generalized)  Rationale for Evaluation and Treatment: Rehabilitation  ONSET DATE: 10/2022  SUBJECTIVE:  SUBJECTIVE STATEMENT: Indicated improvement from the DN, he can now sleep better. He does relay soreness and maybe overdoing it with the exercises  PERTINENT HISTORY: Crohn's Disease, MS (see chart list)  PAIN:  NPRS scale: 5/10  Pain location: Rt shoulder, anterior and top of shoulder at times.  Pain description: sharp Aggravating factors: lifting, reaching overhead, increased activity Relieving factors: relative rest  PRECAUTIONS: None  WEIGHT BEARING RESTRICTIONS: No  FALLS:  Has patient fallen in last 6 months? No  LIVING ENVIRONMENT: Lives in: Lives home/apartment  OCCUPATION: Disability  PLOF: Independent, Lt hand dominant, going to softball games for family member.  Goes to gym 2x/week typically for whole body exercise.  Limited due to Rt arm symptoms.   PATIENT GOALS:Reduce pain, get more exercise.   Next MD visit:   OBJECTIVE:   PATIENT SURVEYS:  11/28/2022 FOTO intake:  64   predicted:  72  COGNITION: 11/28/2022 Overall cognitive status: WFL     SENSATION: 11/28/2022 No specific limitations.   POSTURE: 11/28/2022 Mild rounded shoulders   UPPER EXTREMITY ROM:   ROM Right 11/28/2022 Left 11/28/2022  Shoulder flexion 150 c painful WFL  Shoulder extension    Shoulder abduction    Shoulder adduction    Shoulder internal rotation Muscle guarding noted but able to reach [facility]  Shoulder external  rotation [facility] c pain end range  [facility]  Elbow flexion    Elbow extension    Wrist flexion    Wrist extension    Wrist ulnar deviation    Wrist radial deviation    Wrist pronation    Wrist supination    (Blank rows = not tested)  UPPER EXTREMITY MMT:  MMT Right 11/28/2022 Left 11/28/2022  Shoulder flexion 4/5 c pain 5/5  Shoulder extension    Shoulder abduction 5/5 5/5  Shoulder adduction    Shoulder internal rotation 5/5 5/5  Shoulder external rotation 5/5 5/5  Middle trapezius    Lower trapezius    Elbow flexion 4+/5 c pain 5/5  Elbow extension 5/5 5/5  Wrist flexion    Wrist extension    Wrist ulnar deviation    Wrist radial deviation    Wrist pronation    Wrist supination    Grip strength (lbs)    (Blank rows = not tested)  SHOULDER SPECIAL TESTS: 11/28/2022 (+) Rt painful arc, empty can for pain but not weakness, hawkins kennedy (-) Rt speed testing  JOINT MOBILITY TESTING:  11/28/2022 Unremarkable   PALPATION:  11/28/2022 Trigger points c concordant symptoms with Rt proximal biceps tendon long head, Rt anterior deltoid Trigger points, Rt infraspinatus trigger points.    TODAY'S TREATMENT:                                                                              DATE: 12/13/2022 Manual: Compression to Rt shoulder and skilled palpation with dry needling.   Trigger Point Dry-Needling  Treatment instructions: Expect mild to moderate muscle soreness. S/S of pneumothorax if dry needled over a lung field, and to seek immediate medical attention should they occur. Patient verbalized understanding of these instructions and education.  Patient Consent Given: Yes Education handout provided: Previously provided Muscles treated: Rt infraspinatus,  Rt deltoids lateral and anterior Treatment response/outcome: concordant symptoms c twitch response.  No adverse reactions.    Therex: UBE fwd/back 3 mins each way lvl 3.0 Seated cross arm stretch 15 sec x 3 Rt Standing ER with  red band 2 x 10 Standing IR with red band 2X10 Green band rows 2 x 10 bilateral Green band GH ext 2 x 10 bilateral  DATE: 12/02/2022 Manual: Compression to Rt infraspinatus, skilled palpation with dry needling.   Trigger Point Dry-Needling  Treatment instructions: Expect mild to moderate muscle soreness. S/S of pneumothorax if dry needled over a lung field, and to seek immediate medical attention should they occur. Patient verbalized understanding of these instructions and education.  Patient Consent Given: Yes Education handout provided: Previously provided Muscles treated: Rt infraspinatus Treatment response/outcome: concordant symptoms c twitch response.  No adverse reactions.    Therex: UBE fwd/back 3 mins each way lvl 2.0 Seated cross arm stretch 15 sec x 3 Rt Standing Green band Rt shoulder ER c towel at side 2 x 10 Blue band rows 2 x 15 bilateral Blue band GH ext 2 x 15 bilateral       PATIENT EDUCATION: 11/28/2022 Education details: HEP, POC Person educated: Patient Education method: Consulting civil engineer, Demonstration, Verbal cues, and Handouts Education comprehension: verbalized understanding, returned demonstration, and verbal cues required  HOME EXERCISE PROGRAM: Access Code: [email or web address] URL: [email or web address] Date: 12/02/2022 Prepared by: Scot Jun  Exercises - Seated Scapular Retraction  - 3-5 x daily - 7 x weekly - 1 sets - 10 reps - 3-5 hold - Standing Isometric Shoulder External Rotation with Doorway (Mirrored)  - 2-3 x daily - 7 x weekly - 1 sets - 10 reps - 5 hold - Standing Bilateral Low Shoulder Row with Anchored Resistance  - 1-2 x daily - 7 x weekly - 2-3 sets - 10-15 reps - Shoulder Extension with Resistance  - 1-2 x daily - 7 x weekly - 1-2 sets - 10-15 reps - Shoulder External Rotation with Anchored Resistance (Mirrored)  - 2 x daily - 7 x weekly - 2-3 sets - 10-15 reps  ASSESSMENT:  CLINICAL IMPRESSION: He had some complaints of  soreness so we modified his resistance/reps accordingly.  He had good response to DN today and we will reassess his response for this next visit. Pt to continue to benefit from skilled PT services.  OBJECTIVE IMPAIRMENTS: decreased coordination, decreased endurance, decreased mobility, decreased strength, increased fascial restrictions, impaired perceived functional ability, impaired flexibility, impaired UE functional use, and pain.   ACTIVITY LIMITATIONS: carrying, lifting, sleeping, and reach over head  PARTICIPATION LIMITATIONS: cleaning, laundry, and community activity  PERSONAL FACTORS:  no specific limitations  are also affecting patient's functional outcome.   REHAB POTENTIAL: Good  CLINICAL DECISION MAKING: Stable/uncomplicated  EVALUATION COMPLEXITY: Low   GOALS: Goals reviewed with patient? Yes  SHORT TERM GOALS: (target date for Short term goals are 3 weeks 12/19/2022)  1.Patient will demonstrate independent use of home exercise program to maintain progress from in clinic treatments. Goal status: on going 12/02/2022  LONG TERM GOALS: (target dates for all long term goals are 10 weeks  02/06/2023 )   1. Patient will demonstrate/report pain at worst less than or equal to 2/10 to facilitate minimal limitation in daily activity secondary to pain symptoms. Goal status: New   2. Patient will demonstrate independent use of home exercise program to facilitate ability to maintain/progress functional gains from skilled physical therapy services. Goal status: New   3.  Patient will demonstrate FOTO outcome > or = 72 % to indicate reduced disability due to condition. Goal status: New   4.  Patient will demonstrate Rt UE MMT 5/5 throughout to facilitate lifting, reaching, carrying at [facility] in daily activity.   Goal status: New   5.  Patient will demonstrate  [facility] joint AROM WFL s symptoms to facilitate usual overhead reaching, self care, dressing at PLOF.    Goal status: New   6.   Patient will demonstrate/report ability to lift/carry around household items at [facility] s limitation.  Goal status: New   7.  Patient will demonstrate/report sleep unrestricted due to symptoms.  Goal Status: New  PLAN:  PT FREQUENCY: 1-2x/week  PT DURATION: 10 weeks  PLANNED INTERVENTIONS: Therapeutic exercises, Therapeutic activity, Neuro Muscular re-education, Balance training, Gait training, Patient/Family education, Joint mobilization, Stair training, DME instructions, Dry Needling, Electrical stimulation, Traction, Cryotherapy, vasopneumatic device Moist heat, Taping, Ultrasound, Ionotophoresis 42m/ml Dexamethasone, and Manual therapy.  All included unless contraindicated  PLAN FOR NEXT SESSION: Dry needling as desired.  Progressive strengthening.    BElsie Ra PT, DPT 12/13/22 3:27 PM

## 2022-12-15 ENCOUNTER — Ambulatory Visit: Payer: Medicare Other | Admitting: Rehabilitative and Restorative Service Providers"

## 2022-12-15 ENCOUNTER — Encounter: Payer: Self-pay | Admitting: Rehabilitative and Restorative Service Providers"

## 2022-12-15 DIAGNOSIS — M6281 Muscle weakness (generalized): Secondary | ICD-10-CM

## 2022-12-15 DIAGNOSIS — M25511 Pain in right shoulder: Secondary | ICD-10-CM

## 2022-12-15 DIAGNOSIS — G8929 Other chronic pain: Secondary | ICD-10-CM

## 2022-12-15 NOTE — Therapy (Signed)
OUTPATIENT PHYSICAL THERAPY  TREATMENT   Patient Name: Jonathon Vazquez MRN: [id number] DOB:1971/07/07, 52 y.o., male Today's Date: 12/15/2022  END OF SESSION:  PT End of Session - 12/15/22 1448     Visit Number 4    Number of Visits 20    Date for PT Re-Evaluation 02/06/23    Authorization Type BCBS Medicare $25 copay    Progress Note Due on Visit 10    PT Start Time 1426    PT Stop Time 1505    PT Time Calculation (min) 39 min    Activity Tolerance Patient limited by pain    Behavior During Therapy [facility] for tasks assessed/performed               Past Medical History:  Diagnosis Date   Anal fistula 1996   Anal stenosis    Atrial fibrillation ([place])    pt and wife deny this   Colon stricture ([place])    descending colon   Crohn's disease ([place])    Hx of colonic polyp 10/21/2016   MRSA (methicillin resistant Staphylococcus aureus)    Multiple sclerosis ([place])    Neuromuscular disorder ([place])    Patent ductus arteriosus    Regional enteritis of large intestine ([place])    S/P left hemicolectomy 2015   [facility]-done for stricture   Stricture of descending colon due to Crohn's disease 02/13/2012   Past Surgical History:  Procedure Laterality Date   COLON SURGERY  2015   descending colon stricture    COLONOSCOPY     multiple   Patient Active Problem List   Diagnosis Date Noted   Left wrist pain 09/15/2021   Relapsing remitting multiple sclerosis ([place]) 09/09/2021   High risk medication use 09/09/2021   Crohn's disease of small intestine without complication ([place]) 0000000   Cellulitis 06/24/2018   Patent ductus arteriosus    MRSA (methicillin resistant Staphylococcus aureus)    Crohn's disease ([place])    Therapeutic drug monitoring 06/13/2017   Encounter for therapeutic drug monitoring 02/09/2017   Visual changes 12/07/2016   Insomnia 12/07/2016   Hx of colonic polyp 10/21/2016   PDA (patent ductus arteriosus) 03/01/2012   Multiple sclerosis ([place]) 12/11/2008   Crohn's  colitis- hx strictures and resection 12/11/2008    PCP: Susy Frizzle, MD  REFERRING PROVIDER: Susy Frizzle, MD  REFERRING DIAG: M75.80 (ICD-10-CM) - Rotator cuff tendinitis, unspecified laterality  THERAPY DIAG:  Chronic right shoulder pain  Muscle weakness (generalized)  Rationale for Evaluation and Treatment: Rehabilitation  ONSET DATE: 10/2022  SUBJECTIVE:  SUBJECTIVE STATEMENT: He indicated feeling consistent complaints in upper trap/superior shoulder and lateral upper arm since last visit.  He indicated going back to use gel due to symptoms, noted at night.  First day treatment was more helpful.   PERTINENT HISTORY: Crohn's Disease, MS (see chart list)  PAIN:  NPRS scale: 4/10 Pain location: Rt shoulder, anterior and top of shoulder at times.  Pain description: sharp Aggravating factors: lifting, reaching overhead, increased activity Relieving factors: relative rest  PRECAUTIONS: None  WEIGHT BEARING RESTRICTIONS: No  FALLS:  Has patient fallen in last 6 months? No  LIVING ENVIRONMENT: Lives in: Lives home/apartment  OCCUPATION: Disability  PLOF: Independent, Lt hand dominant, going to softball games for family member.  Goes to gym 2x/week typically for whole body exercise.  Limited due to Rt arm symptoms.   PATIENT GOALS:Reduce pain, get more exercise.   Next MD visit:   OBJECTIVE:   PATIENT SURVEYS:  11/28/2022 FOTO intake:  64   predicted:  72  COGNITION: 11/28/2022 Overall cognitive status: WFL     SENSATION: 11/28/2022 No specific limitations.   POSTURE: 11/28/2022 Mild rounded shoulders   UPPER EXTREMITY ROM:   ROM Right 11/28/2022 Left 11/28/2022 Right 12/15/2022  Shoulder flexion 150 c painful WFL Elevation against gravity painful throughout and limited to  75% WFL.  Post manual/needling:  WFL without pain  Shoulder extension     Shoulder abduction     Shoulder adduction     Shoulder internal rotation Muscle guarding noted but able to reach [facility]   Shoulder external rotation [facility] c pain end range  [facility]   Elbow flexion     Elbow extension     Wrist flexion     Wrist extension     Wrist ulnar deviation     Wrist radial deviation     Wrist pronation     Wrist supination     (Blank rows = not tested)  UPPER EXTREMITY MMT:  MMT Right 11/28/2022 Left 11/28/2022   Shoulder flexion 4/5 c pain 5/5   Shoulder extension     Shoulder abduction 5/5 5/5   Shoulder adduction     Shoulder internal rotation 5/5 5/5   Shoulder external rotation 5/5 5/5   Middle trapezius     Lower trapezius     Elbow flexion 4+/5 c pain 5/5   Elbow extension 5/5 5/5   Wrist flexion     Wrist extension     Wrist ulnar deviation     Wrist radial deviation     Wrist pronation     Wrist supination     Grip strength (lbs)     (Blank rows = not tested)  SHOULDER SPECIAL TESTS: 11/28/2022 (+) Rt painful arc, empty can for pain but not weakness, hawkins kennedy (-) Rt speed testing  JOINT MOBILITY TESTING:  11/28/2022 Unremarkable   PALPATION:  11/28/2022 Trigger points c concordant symptoms with Rt proximal biceps tendon long head, Rt anterior deltoid Trigger points, Rt infraspinatus trigger points.    TODAY'S TREATMENT:                                                                           DATE: 12/15/2022 Manual: Compression to  Rt shoulder and skilled palpation with dry needling.   Trigger Point Dry-Needling  Treatment instructions: Expect mild to moderate muscle soreness. S/S of pneumothorax if dry needled over a lung field, and to seek immediate medical attention should they occur. Patient verbalized understanding of these instructions and education.  Patient Consent Given: Yes Education handout provided: Previously provided Muscles treated: Rt  infraspinatus Treatment response/outcome: concordant symptoms c twitch response.  No adverse reactions.     Moist heat 2 mins prior to therex and then continued as noted below  Therex: UBE fwd/back 3 mins each way lvl 3.0 Seated cross arm stretch 15 sec x 3 Rt c moist heat Rt upper trap stretch 15 sec x 3 c moist heat Standing c towel under arm ER green band 2 x 10   TODAY'S TREATMENT:                                                                           DATE: 12/13/2022 Manual: Compression to Rt shoulder and skilled palpation with dry needling.   Trigger Point Dry-Needling  Treatment instructions: Expect mild to moderate muscle soreness. S/S of pneumothorax if dry needled over a lung field, and to seek immediate medical attention should they occur. Patient verbalized understanding of these instructions and education.  Patient Consent Given: Yes Education handout provided: Previously provided Muscles treated: Rt infraspinatus, Rt deltoids lateral and anterior Treatment response/outcome: concordant symptoms c twitch response.  No adverse reactions.    Therex: UBE fwd/back 3 mins each way lvl 3.0 Seated cross arm stretch 15 sec x 3 Rt Standing ER with red band 2 x 10 Standing IR with red band 2X10 Green band rows 2 x 10 bilateral Green band GH ext 2 x 10 bilateral  TODAY'S TREATMENT:                                                                           DATE: 12/02/2022 Manual: Compression to Rt infraspinatus, skilled palpation with dry needling.   Trigger Point Dry-Needling  Treatment instructions: Expect mild to moderate muscle soreness. S/S of pneumothorax if dry needled over a lung field, and to seek immediate medical attention should they occur. Patient verbalized understanding of these instructions and education.  Patient Consent Given: Yes Education handout provided: Previously provided Muscles treated: Rt infraspinatus Treatment response/outcome: concordant  symptoms c twitch response.  No adverse reactions.    Therex: UBE fwd/back 3 mins each way lvl 2.0 Seated cross arm stretch 15 sec x 3 Rt Standing Green band Rt shoulder ER c towel at side 2 x 10 Blue band rows 2 x 15 bilateral Blue band GH ext 2 x 15 bilateral       PATIENT EDUCATION: 12/15/2022 Education details: HEP update Person educated: Patient Education method: Explanation, Demonstration, Verbal cues, and Handouts Education comprehension: verbalized understanding, returned demonstration, and verbal cues required  HOME EXERCISE PROGRAM: Access Code: [email or web address] URL: [email or web address] Date: 12/15/2022 Prepared by:  Scot Jun  Exercises - Seated Scapular Retraction  - 3-5 x daily - 7 x weekly - 1 sets - 10 reps - 3-5 hold - Standing Shoulder Posterior Capsule Stretch (Mirrored)  - 2-3 x daily - 7 x weekly - 1 sets - 5 reps - 15-30 hold - Seated Gentle Upper Trapezius Stretch  - 2-3 x daily - 7 x weekly - 1 sets - 5 reps - 15 hold - Standing Isometric Shoulder External Rotation with Doorway (Mirrored)  - 2-3 x daily - 7 x weekly - 1 sets - 10 reps - 5 hold - Standing Bilateral Low Shoulder Row with Anchored Resistance  - 1-2 x daily - 7 x weekly - 2-3 sets - 10-15 reps - Shoulder Extension with Resistance  - 1-2 x daily - 7 x weekly - 1-2 sets - 10-15 reps - Shoulder External Rotation with Anchored Resistance (Mirrored)  - 1-2 x daily - 7 x weekly - 2-3 sets - 10-15 reps  ASSESSMENT:  CLINICAL IMPRESSION: Indications from last visit were that added muscles in trigger point needling were not helpful in symptom reduction.  Per Pt desire and on results of assessment today, infraspinatus trigger points seemed to be most concordant pain related.  Difficulty c elevation and strength in arm still problematic secondary to pain reported.  Continued skilled PT services indicated at this time.  Pt indicated primary soreness in back of shoulder upon end of session  today with reduction in lateral arm complaints.    OBJECTIVE IMPAIRMENTS: decreased coordination, decreased endurance, decreased mobility, decreased strength, increased fascial restrictions, impaired perceived functional ability, impaired flexibility, impaired UE functional use, and pain.   ACTIVITY LIMITATIONS: carrying, lifting, sleeping, and reach over head  PARTICIPATION LIMITATIONS: cleaning, laundry, and community activity  PERSONAL FACTORS:  no specific limitations  are also affecting patient's functional outcome.   REHAB POTENTIAL: Good  CLINICAL DECISION MAKING: Stable/uncomplicated  EVALUATION COMPLEXITY: Low   GOALS: Goals reviewed with patient? Yes  SHORT TERM GOALS: (target date for Short term goals are 3 weeks 12/19/2022)  1.Patient will demonstrate independent use of home exercise program to maintain progress from in clinic treatments. Goal status: Met  LONG TERM GOALS: (target dates for all long term goals are 10 weeks  02/06/2023 )   1. Patient will demonstrate/report pain at worst less than or equal to 2/10 to facilitate minimal limitation in daily activity secondary to pain symptoms. Goal status: on going 12/15/2022   2. Patient will demonstrate independent use of home exercise program to facilitate ability to maintain/progress functional gains from skilled physical therapy services. Goal status: on going 12/15/2022   3. Patient will demonstrate FOTO outcome > or = 72 % to indicate reduced disability due to condition. Goal status: on going 12/15/2022   4.  Patient will demonstrate Rt UE MMT 5/5 throughout to facilitate lifting, reaching, carrying at [facility] in daily activity.   Goal status: on going 12/15/2022   5.  Patient will demonstrate  [facility] joint AROM WFL s symptoms to facilitate usual overhead reaching, self care, dressing at PLOF.    Goal status: on going 12/15/2022   6.  Patient will demonstrate/report ability to lift/carry around household items at [facility] s  limitation.  Goal status: on going 12/15/2022   7.  Patient will demonstrate/report sleep unrestricted due to symptoms.  Goal Status: on going 12/15/2022  PLAN:  PT FREQUENCY: 1-2x/week  PT DURATION: 10 weeks  PLANNED INTERVENTIONS: Therapeutic exercises, Therapeutic activity, Neuro Muscular re-education, Balance  training, Gait training, Patient/Family education, Joint mobilization, Stair training, DME instructions, Dry Needling, Electrical stimulation, Traction, Cryotherapy, vasopneumatic device Moist heat, Taping, Ultrasound, Ionotophoresis 74m/ml Dexamethasone, and Manual therapy.  All included unless contraindicated  PLAN FOR NEXT SESSION: Review response from dry needling treatment today.  Continue progression to improve symptoms and strength in Rt arm.    MScot Jun PT, DPT, OCS, ATC 12/15/22  3:07 PM

## 2022-12-19 ENCOUNTER — Encounter: Payer: Self-pay | Admitting: Rehabilitative and Restorative Service Providers"

## 2022-12-19 ENCOUNTER — Ambulatory Visit: Payer: Medicare Other | Admitting: Rehabilitative and Restorative Service Providers"

## 2022-12-19 DIAGNOSIS — M25511 Pain in right shoulder: Secondary | ICD-10-CM

## 2022-12-19 DIAGNOSIS — G8929 Other chronic pain: Secondary | ICD-10-CM

## 2022-12-19 DIAGNOSIS — M6281 Muscle weakness (generalized): Secondary | ICD-10-CM

## 2022-12-19 NOTE — Therapy (Signed)
OUTPATIENT PHYSICAL THERAPY  TREATMENT   Patient Name: Jonathon Vazquez MRN: [id number] DOB:02-01-1971, 52 y.o., male Today's Date: 12/19/2022  END OF SESSION:  PT End of Session - 12/19/22 1541     Visit Number 5    Number of Visits 20    Date for PT Re-Evaluation 02/06/23    Authorization Type [employer] Medicare $25 copay    Progress Note Due on Visit 10    PT Start Time 1558    PT Stop Time 1623    PT Time Calculation (min) 25 min    Activity Tolerance Patient tolerated treatment well    Behavior During Therapy WFL for tasks assessed/performed                Past Medical History:  Diagnosis Date   Anal fistula 1996   Anal stenosis    Atrial fibrillation ([place])    pt and wife deny this   Colon stricture ([place])    descending colon   Crohn's disease ([place])    Hx of colonic polyp 10/21/2016   MRSA (methicillin resistant Staphylococcus aureus)    Multiple sclerosis ([place])    Neuromuscular disorder ([place])    Patent ductus arteriosus    Regional enteritis of large intestine ([place])    S/P left hemicolectomy 2015   [facility]-done for stricture   Stricture of descending colon due to Crohn's disease 02/13/2012   Past Surgical History:  Procedure Laterality Date   COLON SURGERY  2015   descending colon stricture    COLONOSCOPY     multiple   Patient Active Problem List   Diagnosis Date Noted   Left wrist pain 09/15/2021   Relapsing remitting multiple sclerosis ([place]) 09/09/2021   High risk medication use 09/09/2021   Crohn's disease of small intestine without complication ([place]) 0000000   Cellulitis 06/24/2018   Patent ductus arteriosus    MRSA (methicillin resistant Staphylococcus aureus)    Crohn's disease ([place])    Therapeutic drug monitoring 06/13/2017   Encounter for therapeutic drug monitoring 02/09/2017   Visual changes 12/07/2016   Insomnia 12/07/2016   Hx of colonic polyp 10/21/2016   PDA (patent ductus arteriosus) 03/01/2012   Multiple sclerosis ([place]) 12/11/2008    Crohn's colitis- hx strictures and resection 12/11/2008    PCP: Susy Frizzle, MD  REFERRING PROVIDER: Susy Frizzle, MD  REFERRING DIAG: M75.80 (ICD-10-CM) - Rotator cuff tendinitis, unspecified laterality  THERAPY DIAG:  Chronic right shoulder pain  Muscle weakness (generalized)  Rationale for Evaluation and Treatment: Rehabilitation  ONSET DATE: 10/2022  SUBJECTIVE:  SUBJECTIVE STATEMENT: He indicated having good improvement from last visit.  He stated still avoiding harder things at home but sleeping better and doing most things better.  No pain upon arrival.   PERTINENT HISTORY: Crohn's Disease, MS (see chart list)  PAIN:  NPRS scale: 0/10 Pain location: Rt shoulder, anterior and top of shoulder at times.  Pain description: Aggravating factors: Relieving factors: rest, needling.   PRECAUTIONS: None  WEIGHT BEARING RESTRICTIONS: No  FALLS:  Has patient fallen in last 6 months? No  LIVING ENVIRONMENT: Lives in: Lives home/apartment  OCCUPATION: Disability  PLOF: Independent, Lt hand dominant, going to softball games for family member.  Goes to gym 2x/week typically for whole body exercise.  Limited due to Rt arm symptoms.   PATIENT GOALS:Reduce pain, get more exercise.   Next MD visit:   OBJECTIVE:   PATIENT SURVEYS:  11/28/2022 FOTO intake:  64   predicted:  72  COGNITION: 11/28/2022 Overall cognitive status: WFL     SENSATION: 11/28/2022 No specific limitations.   POSTURE: 11/28/2022 Mild rounded shoulders   UPPER EXTREMITY ROM:   ROM Right 11/28/2022 Left 11/28/2022 Right 12/15/2022  Shoulder flexion 150 c painful WFL Elevation against gravity painful throughout and limited to 75% WFL.  Post manual/needling:  WFL without pain  Shoulder extension     Shoulder  abduction     Shoulder adduction     Shoulder internal rotation Muscle guarding noted but able to reach [facility]   Shoulder external rotation [facility] c pain end range  [facility]   Elbow flexion     Elbow extension     Wrist flexion     Wrist extension     Wrist ulnar deviation     Wrist radial deviation     Wrist pronation     Wrist supination     (Blank rows = not tested)  UPPER EXTREMITY MMT:  MMT Right 11/28/2022 Left 11/28/2022   Shoulder flexion 4/5 c pain 5/5   Shoulder extension     Shoulder abduction 5/5 5/5   Shoulder adduction     Shoulder internal rotation 5/5 5/5   Shoulder external rotation 5/5 5/5   Middle trapezius     Lower trapezius     Elbow flexion 4+/5 c pain 5/5   Elbow extension 5/5 5/5   Wrist flexion     Wrist extension     Wrist ulnar deviation     Wrist radial deviation     Wrist pronation     Wrist supination     Grip strength (lbs)     (Blank rows = not tested)  SHOULDER SPECIAL TESTS: 11/28/2022 (+) Rt painful arc, empty can for pain but not weakness, hawkins kennedy (-) Rt speed testing  JOINT MOBILITY TESTING:  11/28/2022 Unremarkable   PALPATION:  11/28/2022 Trigger points c concordant symptoms with Rt proximal biceps tendon long head, Rt anterior deltoid Trigger points, Rt infraspinatus trigger points.    TODAY'S TREATMENT:                                                                           DATE: 12/19/2022 Manual: Compression to Rt shoulder and skilled palpation with dry needling.   Trigger Point  Dry-Needling  Treatment instructions: Expect mild to moderate muscle soreness. S/S of pneumothorax if dry needled over a lung field, and to seek immediate medical attention should they occur. Patient verbalized understanding of these instructions and education.  Patient Consent Given: Yes Education handout provided: Previously provided Muscles treated: Rt infraspinatus Treatment response/outcome: concordant symptoms c twitch response.  No adverse  reactions.      Therex: UBE fwd/back 4 mins each way lvl 3.0 with :10 second interval at end of each minute Prone end range Y lift 3 sec hold x 10 bilateral (added to HEP) Prone end range t lift 3 sec hold x 10 bilateral (added to HEP) Prone full range y, t Lt x 10 each  Wall push up SA plus 3 sec hold 2 x 10 (added to HEP)  TODAY'S TREATMENT:                                                                           DATE: 12/15/2022 Manual: Compression to Rt shoulder and skilled palpation with dry needling.   Trigger Point Dry-Needling  Treatment instructions: Expect mild to moderate muscle soreness. S/S of pneumothorax if dry needled over a lung field, and to seek immediate medical attention should they occur. Patient verbalized understanding of these instructions and education.  Patient Consent Given: Yes Education handout provided: Previously provided Muscles treated: Rt infraspinatus Treatment response/outcome: concordant symptoms c twitch response.  No adverse reactions.     Moist heat 2 mins prior to therex and then continued as noted below  Therex: UBE fwd/back 3 mins each way lvl 3.0 Seated cross arm stretch 15 sec x 3 Rt c moist heat Rt upper trap stretch 15 sec x 3 c moist heat Standing c towel under arm ER green band 2 x 10   TODAY'S TREATMENT:                                                                           DATE: 12/13/2022 Manual: Compression to Rt shoulder and skilled palpation with dry needling.   Trigger Point Dry-Needling  Treatment instructions: Expect mild to moderate muscle soreness. S/S of pneumothorax if dry needled over a lung field, and to seek immediate medical attention should they occur. Patient verbalized understanding of these instructions and education.  Patient Consent Given: Yes Education handout provided: Previously provided Muscles treated: Rt infraspinatus, Rt deltoids lateral and anterior Treatment response/outcome: concordant  symptoms c twitch response.  No adverse reactions.    Therex: UBE fwd/back 3 mins each way lvl 3.0 Seated cross arm stretch 15 sec x 3 Rt Standing ER with red band 2 x 10 Standing IR with red band 2X10 Green band rows 2 x 10 bilateral Green band GH ext 2 x 10 bilateral   PATIENT EDUCATION: 12/15/2022 Education details: HEP update Person educated: Patient Education method: Explanation, Demonstration, Verbal cues, and Handouts Education comprehension: verbalized understanding, returned demonstration, and verbal cues required  HOME  EXERCISE PROGRAM: Access Code: [email or web address] URL: [email or web address] Date: 12/19/2022 Prepared by: Scot Jun  Exercises - Seated Scapular Retraction  - 3-5 x daily - 7 x weekly - 1 sets - 10 reps - 3-5 hold - Standing Shoulder Posterior Capsule Stretch (Mirrored)  - 2-3 x daily - 7 x weekly - 1 sets - 5 reps - 15-30 hold - Seated Gentle Upper Trapezius Stretch  - 2-3 x daily - 7 x weekly - 1 sets - 5 reps - 15 hold - Standing Bilateral Low Shoulder Row with Anchored Resistance  - 1-2 x daily - 7 x weekly - 2-3 sets - 10-15 reps - Shoulder Extension with Resistance  - 1-2 x daily - 7 x weekly - 1-2 sets - 10-15 reps - Shoulder External Rotation with Anchored Resistance (Mirrored)  - 1-2 x daily - 7 x weekly - 2-3 sets - 10-15 reps - Prone Scapular Retraction Arms at Side  - 1-2 x daily - 7 x weekly - 1 sets - 10 reps - 3-5 hold - Prone Scapular Retraction Y  - 1-2 x daily - 7 x weekly - 1 sets - 10 reps - 3-5 hold - Wall Push Up with Plus  - 1 x daily - 7 x weekly - 2 sets - 10 reps - 2-3 hold  ASSESSMENT:  CLINICAL IMPRESSION: Pt demonstrated good response from infraspinatus treatment again.  Full range against gravity was noted upon arrival.  Progressed HEP strengthening to help improve strength.    OBJECTIVE IMPAIRMENTS: decreased coordination, decreased endurance, decreased mobility, decreased strength, increased fascial  restrictions, impaired perceived functional ability, impaired flexibility, impaired UE functional use, and pain.   ACTIVITY LIMITATIONS: carrying, lifting, sleeping, and reach over head  PARTICIPATION LIMITATIONS: cleaning, laundry, and community activity  PERSONAL FACTORS:  no specific limitations  are also affecting patient's functional outcome.   REHAB POTENTIAL: Good  CLINICAL DECISION MAKING: Stable/uncomplicated  EVALUATION COMPLEXITY: Low   GOALS: Goals reviewed with patient? Yes  SHORT TERM GOALS: (target date for Short term goals are 3 weeks 12/19/2022)  1.Patient will demonstrate independent use of home exercise program to maintain progress from in clinic treatments. Goal status: Met  LONG TERM GOALS: (target dates for all long term goals are 10 weeks  02/06/2023 )   1. Patient will demonstrate/report pain at worst less than or equal to 2/10 to facilitate minimal limitation in daily activity secondary to pain symptoms. Goal status: on going 12/15/2022   2. Patient will demonstrate independent use of home exercise program to facilitate ability to maintain/progress functional gains from skilled physical therapy services. Goal status: on going 12/15/2022   3. Patient will demonstrate FOTO outcome > or = 72 % to indicate reduced disability due to condition. Goal status: on going 12/15/2022   4.  Patient will demonstrate Rt UE MMT 5/5 throughout to facilitate lifting, reaching, carrying at [facility] in daily activity.   Goal status: on going 12/15/2022   5.  Patient will demonstrate  [facility] joint AROM WFL s symptoms to facilitate usual overhead reaching, self care, dressing at PLOF.    Goal status: on going 12/15/2022   6.  Patient will demonstrate/report ability to lift/carry around household items at [facility] s limitation.  Goal status: on going 12/15/2022   7.  Patient will demonstrate/report sleep unrestricted due to symptoms.  Goal Status: on going 12/15/2022  PLAN:  PT  FREQUENCY: 1-2x/week  PT DURATION: 10 weeks  PLANNED INTERVENTIONS: Therapeutic exercises, Therapeutic activity, Neuro Muscular re-education, Balance  training, Gait training, Patient/Family education, Joint mobilization, Stair training, DME instructions, Dry Needling, Electrical stimulation, Traction, Cryotherapy, vasopneumatic device Moist heat, Taping, Ultrasound, Ionotophoresis '4mg'$ /ml Dexamethasone, and Manual therapy.  All included unless contraindicated  PLAN FOR NEXT SESSION: DN as desired.  Check strength and FOTO.   Due to improvement, frequency adjusted to 1x/week   Scot Jun, PT, DPT, OCS, ATC 12/19/22  4:30 PM

## 2022-12-21 ENCOUNTER — Encounter: Payer: Medicare Other | Admitting: Rehabilitative and Restorative Service Providers"

## 2022-12-26 ENCOUNTER — Ambulatory Visit: Payer: Medicare Other | Admitting: Rehabilitative and Restorative Service Providers"

## 2022-12-26 DIAGNOSIS — M25511 Pain in right shoulder: Secondary | ICD-10-CM | POA: Diagnosis not present

## 2022-12-26 DIAGNOSIS — G8929 Other chronic pain: Secondary | ICD-10-CM

## 2022-12-26 DIAGNOSIS — M6281 Muscle weakness (generalized): Secondary | ICD-10-CM

## 2022-12-26 NOTE — Therapy (Addendum)
OUTPATIENT PHYSICAL THERAPY  TREATMENT /DISCHARGE   Patient Name: Jonathon Vazquez MRN: [id number] DOB:03/02/1971, 52 y.o., male Today's Date: 12/26/2022  END OF SESSION:  PT End of Session - 12/26/22 1523     Visit Number 6    Number of Visits 20    Date for PT Re-Evaluation 02/06/23    Authorization Type [employer] Medicare $25 copay    Progress Note Due on Visit 10    PT Start Time 1513    PT Stop Time 1536    PT Time Calculation (min) 23 min    Activity Tolerance Patient tolerated treatment well    Behavior During Therapy WFL for tasks assessed/performed                 Past Medical History:  Diagnosis Date   Anal fistula 1996   Anal stenosis    Atrial fibrillation (HCC)    pt and wife deny this   Colon stricture (HCC)    descending colon   Crohn's disease (HCC)    Hx of colonic polyp 10/21/2016   MRSA (methicillin resistant Staphylococcus aureus)    Multiple sclerosis (HCC)    Neuromuscular disorder (HCC)    Patent ductus arteriosus    Regional enteritis of large intestine (HCC)    S/P left hemicolectomy 2015   [facility]-done for stricture   Stricture of descending colon due to Crohn's disease 02/13/2012   Past Surgical History:  Procedure Laterality Date   COLON SURGERY  2015   descending colon stricture    COLONOSCOPY     multiple   Patient Active Problem List   Diagnosis Date Noted   Left wrist pain 09/15/2021   Relapsing remitting multiple sclerosis (HCC) 09/09/2021   High risk medication use 09/09/2021   Crohn's disease of small intestine without complication (HCC) 08/22/2019   Cellulitis 06/24/2018   Patent ductus arteriosus    MRSA (methicillin resistant Staphylococcus aureus)    Crohn's disease (HCC)    Therapeutic drug monitoring 06/13/2017   Encounter for therapeutic drug monitoring 02/09/2017   Visual changes 12/07/2016   Insomnia 12/07/2016   Hx of colonic polyp 10/21/2016   PDA (patent ductus arteriosus) 03/01/2012   Multiple sclerosis (HCC)  12/11/2008   Crohn's colitis- hx strictures and resection 12/11/2008    PCP: Donita Brooks, MD  REFERRING PROVIDER: Donita Brooks, MD  REFERRING DIAG: M75.80 (ICD-10-CM) - Rotator cuff tendinitis, unspecified laterality  THERAPY DIAG:  Chronic right shoulder pain  Muscle weakness (generalized)  Rationale for Evaluation and Treatment: Rehabilitation  ONSET DATE: 10/2022  SUBJECTIVE:  SUBJECTIVE STATEMENT: He indicated back to normal stuff at this time without pain at wall.   PERTINENT HISTORY: Crohn's Disease, MS (see chart list)  PAIN:  NPRS scale: 0/10 Pain location: Rt shoulder, anterior and top of shoulder at times.  Pain description: Aggravating factors: Relieving factors: rest, needling.   PRECAUTIONS: None  WEIGHT BEARING RESTRICTIONS: No  FALLS:  Has patient fallen in last 6 months? No  LIVING ENVIRONMENT: Lives in: Lives home/apartment  OCCUPATION: Disability  PLOF: Independent, Lt hand dominant, going to softball games for family member.  Goes to gym 2x/week typically for whole body exercise.  Limited due to Rt arm symptoms.   PATIENT GOALS:Reduce pain, get more exercise.   Next MD visit:   OBJECTIVE:   PATIENT SURVEYS:  12/26/2022:  FOTO predicted:  93  11/28/2022 FOTO intake:  64   predicted:  72  COGNITION: 11/28/2022 Overall cognitive status: WFL     SENSATION: 11/28/2022 No specific limitations.   POSTURE: 11/28/2022 Mild rounded shoulders   UPPER EXTREMITY ROM:   ROM Right 11/28/2022 Left 11/28/2022 Right 12/15/2022 Right 12/26/2022  Shoulder flexion 150 c painful WFL Elevation against gravity painful throughout and limited to 75% WFL.  Post manual/needling:  WFL without pain WFL s symptoms  Shoulder extension      Shoulder abduction      Shoulder  adduction      Shoulder internal rotation Muscle guarding noted but able to reach [facility]    Shoulder external rotation [facility] c pain end range  [facility]    Elbow flexion      Elbow extension      Wrist flexion      Wrist extension      Wrist ulnar deviation      Wrist radial deviation      Wrist pronation      Wrist supination      (Blank rows = not tested)  UPPER EXTREMITY MMT:  MMT Right 11/28/2022 Left 11/28/2022 Right 12/26/2022  Shoulder flexion 4/5 c pain 5/5 5/5  Shoulder extension     Shoulder abduction 5/5 5/5 5/5  Shoulder adduction     Shoulder internal rotation 5/5 5/5 5/5  Shoulder external rotation 5/5 5/5 5/5  Middle trapezius     Lower trapezius     Elbow flexion 4+/5 c pain 5/5 5/5  Elbow extension 5/5 5/5 5/5  Wrist flexion     Wrist extension     Wrist ulnar deviation     Wrist radial deviation     Wrist pronation     Wrist supination     Grip strength (lbs)     (Blank rows = not tested)  SHOULDER SPECIAL TESTS: 11/28/2022 (+) Rt painful arc, empty can for pain but not weakness, hawkins kennedy (-) Rt speed testing  JOINT MOBILITY TESTING:  11/28/2022 Unremarkable   PALPATION:  11/28/2022 Trigger points c concordant symptoms with Rt proximal biceps tendon long head, Rt anterior deltoid Trigger points, Rt infraspinatus trigger points.    TODAY'S TREATMENT:                                                                           DATE: 12/26/2022  Therex: Verbal Review of existing  HEP. Standing green band ER c flexion punch Rt arm 2 x 10  Standing wall push up plus hold c 3 point lateral band pull green band x 10 each bilaterally Row machine single arm 25 lbs 2 x 15 Rt Machine chest press c SA hold 2-3 seconds 15 lbs x 15  Review of various UE gym based activity (lat pull down, triceps, biceps)  TODAY'S TREATMENT:                                                                           DATE: 12/19/2022 Manual: Compression to Rt shoulder and skilled  palpation with dry needling.   Trigger Point Dry-Needling  Treatment instructions: Expect mild to moderate muscle soreness. S/S of pneumothorax if dry needled over a lung field, and to seek immediate medical attention should they occur. Patient verbalized understanding of these instructions and education.  Patient Consent Given: Yes Education handout provided: Previously provided Muscles treated: Rt infraspinatus Treatment response/outcome: concordant symptoms c twitch response.  No adverse reactions.      Therex: UBE fwd/back 4 mins each way lvl 3.0 with :10 second interval at end of each minute Prone end range Y lift 3 sec hold x 10 bilateral (added to HEP) Prone end range t lift 3 sec hold x 10 bilateral (added to HEP) Prone full range y, t Lt x 10 each  Wall push up SA plus 3 sec hold 2 x 10 (added to HEP)  TODAY'S TREATMENT:                                                                           DATE: 12/15/2022 Manual: Compression to Rt shoulder and skilled palpation with dry needling.   Trigger Point Dry-Needling  Treatment instructions: Expect mild to moderate muscle soreness. S/S of pneumothorax if dry needled over a lung field, and to seek immediate medical attention should they occur. Patient verbalized understanding of these instructions and education.  Patient Consent Given: Yes Education handout provided: Previously provided Muscles treated: Rt infraspinatus Treatment response/outcome: concordant symptoms c twitch response.  No adverse reactions.     Moist heat 2 mins prior to therex and then continued as noted below  Therex: UBE fwd/back 3 mins each way lvl 3.0 Seated cross arm stretch 15 sec x 3 Rt c moist heat Rt upper trap stretch 15 sec x 3 c moist heat Standing c towel under arm ER green band 2 x 10   TODAY'S TREATMENT:                                                                           DATE: 12/13/2022 Manual: Compression to Rt  shoulder and skilled  palpation with dry needling.   Trigger Point Dry-Needling  Treatment instructions: Expect mild to moderate muscle soreness. S/S of pneumothorax if dry needled over a lung field, and to seek immediate medical attention should they occur. Patient verbalized understanding of these instructions and education.  Patient Consent Given: Yes Education handout provided: Previously provided Muscles treated: Rt infraspinatus, Rt deltoids lateral and anterior Treatment response/outcome: concordant symptoms c twitch response.  No adverse reactions.    Therex: UBE fwd/back 3 mins each way lvl 3.0 Seated cross arm stretch 15 sec x 3 Rt Standing ER with red band 2 x 10 Standing IR with red band 2X10 Green band rows 2 x 10 bilateral Green band GH ext 2 x 10 bilateral   PATIENT EDUCATION: 12/15/2022 Education details: HEP update Person educated: Patient Education method: Explanation, Demonstration, Verbal cues, and Handouts Education comprehension: verbalized understanding, returned demonstration, and verbal cues required  HOME EXERCISE PROGRAM: Access Code: [email or web address] URL: [email or web address] Date: 12/19/2022 Prepared by: Chyrel Masson  Exercises - Seated Scapular Retraction  - 3-5 x daily - 7 x weekly - 1 sets - 10 reps - 3-5 hold - Standing Shoulder Posterior Capsule Stretch (Mirrored)  - 2-3 x daily - 7 x weekly - 1 sets - 5 reps - 15-30 hold - Seated Gentle Upper Trapezius Stretch  - 2-3 x daily - 7 x weekly - 1 sets - 5 reps - 15 hold - Standing Bilateral Low Shoulder Row with Anchored Resistance  - 1-2 x daily - 7 x weekly - 2-3 sets - 10-15 reps - Shoulder Extension with Resistance  - 1-2 x daily - 7 x weekly - 1-2 sets - 10-15 reps - Shoulder External Rotation with Anchored Resistance (Mirrored)  - 1-2 x daily - 7 x weekly - 2-3 sets - 10-15 reps - Prone Scapular Retraction Arms at Side  - 1-2 x daily - 7 x weekly - 1 sets - 10 reps - 3-5 hold - Prone Scapular Retraction  Y  - 1-2 x daily - 7 x weekly - 1 sets - 10 reps - 3-5 hold - Wall Push Up with Plus  - 1 x daily - 7 x weekly - 2 sets - 10 reps - 2-3 hold  ASSESSMENT:  CLINICAL IMPRESSION: Continue improvement in symptoms and return to PLOF in daily activity has been reported.  Due to improvements and knowledge of HEP, recommend trial HEP.    OBJECTIVE IMPAIRMENTS: decreased coordination, decreased endurance, decreased mobility, decreased strength, increased fascial restrictions, impaired perceived functional ability, impaired flexibility, impaired UE functional use, and pain.   ACTIVITY LIMITATIONS: carrying, lifting, sleeping, and reach over head  PARTICIPATION LIMITATIONS: cleaning, laundry, and community activity  PERSONAL FACTORS:  no specific limitations  are also affecting patient's functional outcome.   REHAB POTENTIAL: Good  CLINICAL DECISION MAKING: Stable/uncomplicated  EVALUATION COMPLEXITY: Low   GOALS: Goals reviewed with patient? Yes  SHORT TERM GOALS: (target date for Short term goals are 3 weeks 12/19/2022)  1.Patient will demonstrate independent use of home exercise program to maintain progress from in clinic treatments. Goal status: Met  LONG TERM GOALS: (target dates for all long term goals are 10 weeks  02/06/2023 )   1. Patient will demonstrate/report pain at worst less than or equal to 2/10 to facilitate minimal limitation in daily activity secondary to pain symptoms. Goal status: Met 12/26/2022   2. Patient will demonstrate independent use of home exercise program to facilitate ability to maintain/progress functional gains  from skilled physical therapy services. Goal status: Met 12/26/2022   3. Patient will demonstrate FOTO outcome > or = 72 % to indicate reduced disability due to condition. Goal status: Met 12/26/2022   4.  Patient will demonstrate Rt UE MMT 5/5 throughout to facilitate lifting, reaching, carrying at [facility] in daily activity.   Goal status: Met  12/26/2022   5.  Patient will demonstrate  [facility] joint AROM WFL s symptoms to facilitate usual overhead reaching, self care, dressing at PLOF.    Goal status: Met 12/26/2022   6.  Patient will demonstrate/report ability to lift/carry around household items at [facility] s limitation.  Goal status: Met 12/26/2022   7.  Patient will demonstrate/report sleep unrestricted due to symptoms.  Goal Status: Met 12/26/2022  PLAN:  PT FREQUENCY: 1-2x/week  PT DURATION: 10 weeks  PLANNED INTERVENTIONS: Therapeutic exercises, Therapeutic activity, Neuro Muscular re-education, Balance training, Gait training, Patient/Family education, Joint mobilization, Stair training, DME instructions, Dry Needling, Electrical stimulation, Traction, Cryotherapy, vasopneumatic device Moist heat, Taping, Ultrasound, Ionotophoresis /ml Dexamethasone, and Manual therapy.  All included unless contraindicated  PLAN FOR NEXT SESSION: Trial HEP.  Discharge after inactivity > 30 days   Chyrel Masson, PT, DPT, OCS, ATC 12/26/22  3:36 PM   PHYSICAL THERAPY DISCHARGE SUMMARY  Visits from Start of Care: 6  Current functional level related to goals / functional outcomes: See note   Remaining deficits: See note   Education / Equipment: HEP  Patient goals were met. Patient is being discharged due to not returning since the last visit.  Chyrel Masson, PT, DPT, OCS, ATC 02/03/23  10:23 AM

## 2022-12-28 ENCOUNTER — Encounter: Payer: Medicare Other | Admitting: Rehabilitative and Restorative Service Providers"

## 2023-01-02 ENCOUNTER — Encounter: Payer: Medicare Other | Admitting: Rehabilitative and Restorative Service Providers"

## 2023-01-04 ENCOUNTER — Encounter: Payer: Medicare Other | Admitting: Rehabilitative and Restorative Service Providers"

## 2023-02-15 ENCOUNTER — Telehealth: Payer: Self-pay

## 2023-02-15 ENCOUNTER — Other Ambulatory Visit: Payer: Self-pay

## 2023-02-15 DIAGNOSIS — G35 Multiple sclerosis: Secondary | ICD-10-CM

## 2023-02-15 DIAGNOSIS — Z79899 Other long term (current) drug therapy: Secondary | ICD-10-CM

## 2023-02-15 NOTE — Telephone Encounter (Signed)
JCV paperwork completed and placed in outgoing quest box.

## 2023-02-15 NOTE — Telephone Encounter (Signed)
Routine labs for infusions ordered.

## 2023-02-16 ENCOUNTER — Telehealth: Payer: Self-pay

## 2023-02-16 LAB — CBC WITH DIFFERENTIAL/PLATELET
Basophils Absolute: 0.1 10*3/uL (ref 0.0–0.2)
Basos: 1 %
EOS (ABSOLUTE): 0.4 10*3/uL (ref 0.0–0.4)
Eos: 5 %
Hematocrit: 40.4 % (ref 37.5–51.0)
Hemoglobin: 13.7 g/dL (ref 13.0–17.7)
Immature Grans (Abs): 0.1 10*3/uL (ref 0.0–0.1)
Immature Granulocytes: 1 %
Lymphocytes Absolute: 2.9 10*3/uL (ref 0.7–3.1)
Lymphs: 43 %
MCH: 28 pg (ref 26.6–33.0)
MCHC: 33.9 g/dL (ref 31.5–35.7)
MCV: 83 fL (ref 79–97)
Monocytes Absolute: 0.5 10*3/uL (ref 0.1–0.9)
Monocytes: 7 %
NRBC: 1 % — ABNORMAL HIGH (ref 0–0)
Neutrophils Absolute: 2.8 10*3/uL (ref 1.4–7.0)
Neutrophils: 43 %
Platelets: 177 10*3/uL (ref 150–450)
RBC: 4.89 x10E6/uL (ref 4.14–5.80)
RDW: 12.8 % (ref 11.6–15.4)
WBC: 6.6 10*3/uL (ref 3.4–10.8)

## 2023-02-16 NOTE — Telephone Encounter (Signed)
Please notify pt that the cbc lab was normal and that we are still awaiting results from jcv lab.   Routing to the phone room to notify pt

## 2023-02-16 NOTE — Telephone Encounter (Signed)
Called pt. Informed him that cbc lab was normal and that we are still awaiting results from jcv lab. Pt said thank you for the update.

## 2023-02-21 ENCOUNTER — Telehealth: Payer: Self-pay | Admitting: Neurology

## 2023-02-21 LAB — RFLX STRATIFY JCV (TM) AB INHIBITION: JCV Antibody by Inhibition: NEGATIVE

## 2023-02-21 LAB — STRATIFY JCV AB (W/ INDEX) W/ RFLX
Index Value: 0.25 — ABNORMAL HIGH
Stratify JCV (TM) Ab w/Reflex Inhibition: UNDETERMINED — AB

## 2023-02-21 NOTE — Telephone Encounter (Signed)
JCV 0.25, indeterminate.  Inhibition assay negative.

## 2023-02-28 ENCOUNTER — Encounter: Payer: Self-pay | Admitting: Neurology

## 2023-02-28 ENCOUNTER — Ambulatory Visit (INDEPENDENT_AMBULATORY_CARE_PROVIDER_SITE_OTHER): Payer: Medicare Other | Admitting: Neurology

## 2023-02-28 VITALS — BP 128/78 | Ht 71.0 in | Wt 201.0 lb

## 2023-02-28 DIAGNOSIS — Z79899 Other long term (current) drug therapy: Secondary | ICD-10-CM

## 2023-02-28 DIAGNOSIS — G35 Multiple sclerosis: Secondary | ICD-10-CM | POA: Diagnosis not present

## 2023-02-28 NOTE — Progress Notes (Signed)
ASSESSMENT AND PLAN 52 y.o. year old male   1.  Relapsing remitting multiple sclerosis 2.  Crohn's disease, no exacerbations -Overall doing well, no worsening symptoms -On Tysabri since December 2016  Anti-JC virus antibody on  October 2023 0.25 negative     DIAGNOSTIC DATA (LABS, IMAGING, TESTING) - I reviewed patient records, labs, notes, testing and imaging myself where available.   Anti-JC virus antibody was negative with a titer of 0.25 in October 2023  MRI of the brain with and without contrast in Nov 2023: Multiple T2/FLAIR hyperintense foci in the brainstem, cerebellum, left thalamus and cerebral hemispheres in a pattern consistent with chronic demyelinating plaque associated with multiple sclerosis.  None of the foci enhanced or appear to be acute.  Compared to the MRI from 03/24/2021, there were no new lesions. No acute findings.  Normal enhancement pattern.  MRI of cervical spine w/wo in Nov 2023  Chronic demyelinating lesions involving the cervical cord at C2 and C4-5, not significantly changed or progressed as compared to previous MRI from 2015. No evidence for active demyelination. 2. Left paracentral disc protrusions at C5-6 and C6-7 with resultant mild cord flattening, but no significant spinal stenosis.    HISTORY:  He was seen by his [facility] since 2000, initially presenting with facial numbness, MRI demonstrates lesions consistent with MS, however he has lost followups, was suggested Copaxone in 2004, but never followed through, he was reevaluated by Dr. Lissa Morales in February of 2008, he began to have bladder incontinence, dizziness, blurry vision, unbalanced gait, repeat MRI at that time continued to demonstrate multiple foci of signal hyperintensity, involving brainstem, superior peduncle, deep white matters, there was pericallosal involvement, no enhancement, but there was positive DWIs lesion, he was started on Avenox since 06/13/2007,  he continued to have flulike  illness following injection,   He also reported a history of Crohn's disease, last treatment was in 2004 with Ascol, he no longer has significant GI symptoms, He was treated with immunosuppressive treatment mercaptopurine in late 1990s, for 3-4 months, still had diahrrea.   He had one to 2 flareups each year,  presenting with increased dizziness, unbalanced gait, blurry vision, worsening memory trouble, often partial improvement with steroid package.   Most recent MRI cervical was in July 2011, remote demyelinating plaque at C2 and C4, no enhancing lesions.   Repeat MRI brain and cervical in Jan 2013 showed a defined hyperintensity in the spinal cord at C2 which likely represents remote age inactive demyelinating plaque. No enhancing lesions are noted. As compared with previous MRI dated 04/30/2010 the C4 spinal cord hyperintensity is no longer seen.   MRI scan of the brain showed multiple brainstem, cerebellar, corpus callosal and periventricular white matter hyperintensities consistent with multiple sclerosis. No enhancing lesions are noted. Presence of T1 black holes  andd corpus callosal atrophy indicate chronic disease. Overall no significant changes compared with MRI from 01/23/2007    He is doing very well, there was no new neurological symptoms, he is taking Avonex, mild flulike illness right out of the injection, improved by Tylenol, or aleve.  He complains of frequent bilateral frontal headaches.   We have reviewed MRI of the brain, which has demonstrated multiple periventricular and subcortical and infratentorial chronic demyelinating plaques. No acute plaques. Compared to prior MRI on 01/23/07, there are has been disease progression.   He continues to do very well, reported prior to MRI scan, he has been off of his Avonex for 4 months,  due to insurance reasons, after discussed with Mr. Remache we decided to stay on current Avonex treatment, overall only slight change in his MRI scan there is no  significant clinical worsening, we will repeat MRI of the brain and cervical spine with and without contrast in one year.   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 Leone Payor, he has been treated with prednisone, mercaptopurine in 2011 150mg  daily for one year, ( which is a nucleoside metabolic inhibitor that results in cell-cycle arrest and death) and mesalamine in the past,   We decided not to proceed with Silvestre Moment,  Keep him on Avonex he complains of 3 weeks history of left arm numbness, since January 2015, has much improved now,   Update September 15, 2021: Patient return for electrodiagnostic study today, showed no evidence of left upper extremity neuropathy, the tenderness is more at the left radial styloid, happened after excessive use of left hand,   We again reviewed multiple MRI of the brain, cervical spine in the past, reviewed his MS history, had worsening neurological symptoms, from 20 14-20 16, correlate with worsening Crohn's disease, there was also evidence of mild progression by MRI standard  His symptoms overall has much improved with Tysabri treatment since December 2016, he tolerated infusion every 4 weeks well, JC virus titer remains at the low side, 0.25 in October 2022  Patient's and wife quoted recent biogen study for extending Tysabri infusion to every 6 weeks, to avoid long-term infusion side effect, especially the worrisome PML, despite the fact that he is at the lower risk group,  We went over published data from Lancet neurology in April 2022 https://[email or web address].com/journals/laneur/article/[email or web address]/fulltext   "We found a numerical difference in  the mean number of new or newly enlarging T2 hyperintense lesions at week 72 between the once every 6 weeks and once every 4 weeks groups, which reached significance under the secondary estimand, but interpretation of statistical differences (or absence thereof) is limited because disease activity in the once every 4 weeks group was lower than expected. The safety profiles of natalizumab once every 6 weeks and once every 4 weeks were similar. Although this trial was not powered to assess differences in risk of progressive multifocal leukoencephalopathy, the occurrence of the (asymptomatic) case underscores the importance of monitoring and risk factor consideration in all patients receiving natalizumab."  UPDATE Feb 28 2023: He is accompanied by his wife at today's clinical visit, remained on Tysabri every 4 weeks, doing very well, Crohn's disease is in remission as well, no new MS flareup  He has mild fatigue, some improvement with workout  JC virus titer 0.25, negative on August 17, 2022  We personally reviewed and compared MRI of the brain August 25, 2022, multiple T2/FLAIR hyperintensity signal in the brainstem, cerebellum, left thalamus, cerebral hemisphere, no change compared to previous MRIs  MRI of cervical spine showed chronic demyelinating lesions involving cervical cord at C2, C4-5, no significant change compared to previous scan, no evidence of contrast-enhancement  REVIEW OF SYSTEMS: Out of a complete 14 system review of symptoms, the patient complains only of the following symptoms, and all other reviewed systems are negative.  See HPI  PHYSICAL EXAM  Vitals:   02/28/23 1504  BP: 128/78  Weight: 201 lb (91.2 kg)  Height: 5\' 11"  (1.803 m)   Body mass index is 28.03 kg/m.   PHYSICAL EXAMNIATION:  Gen: NAD, conversant, well nourised, well groomed                     Cardiovascular: Regular rate rhythm, no peripheral edema, warm, nontender. Eyes: Conjunctivae clear without  exudates or hemorrhage Neck: Supple, no carotid bruits. Pulmonary: Clear to auscultation bilaterally   NEUROLOGICAL EXAM:  MENTAL STATUS: Speech/cognition: Awake, alert oriented to history taking and casual conversation  CRANIAL NERVES: CN II: Visual fields are full to confrontation.  Pupils are round equal and briskly reactive to light. CN III, IV, VI: extraocular movement are normal. No ptosis. CN V: Facial sensation is intact to pinprick in all 3 divisions bilaterally. Corneal responses are intact.  CN VII: Face is symmetric with normal eye closure and smile. CN VIII: Hearing is normal to casual conversation CN IX, X: Palate elevates symmetrically. Phonation is normal. CN XI: Head turning and shoulder shrug are intact CN XII: Tongue is midline with normal movements and no atrophy.  MOTOR: There is no pronator drift of out-stretched arms. Muscle bulk and tone are normal. Muscle strength is normal.  REFLEXES: Reflexes are 2+ and symmetric at the biceps, triceps, knees, and ankles. Plantar responses are flexor.  SENSORY: Intact to light touch, pinprick, positional and vibratory sensation are intact in fingers and toes.  COORDINATION: Rapid alternating movements and fine finger movements are intact. There is no dysmetria on finger-to-nose and heel-knee-shin.    GAIT/STANCE: Posture is normal. Gait is steady with normal steps, base, arm swing, and turning. Heel and toe walking are normal. Tandem gait is normal.  Romberg is absent.    ALLERGIES: Allergies  Allergen Reactions   Lialda [Mesalamine] Diarrhea    Severe diarrhea    HOME MEDICATIONS: Outpatient Medications Prior to Visit  Medication Sig Dispense Refill   acetaminophen (TYLENOL) 325 MG tablet Take by mouth.     fluticasone (FLONASE) 50 MCG/ACT nasal spray SPRAY 2 SPRAYS INTO EACH NOSTRIL EVERY DAY 48 mL 3   MULTIPLE VITAMIN PO Take by mouth.     natalizumab (TYSABRI) 300 MG/15ML injection Inject into the vein.  1 hour infusion monthly     No facility-administered medications prior to visit.    PAST MEDICAL HISTORY: Past Medical History:  Diagnosis Date   Anal fistula 1996   Anal stenosis    Atrial fibrillation (HCC)    pt and wife deny this   Colon stricture (HCC)    descending colon   Crohn's disease (HCC)    Hx of colonic polyp 10/21/2016   MRSA (methicillin resistant Staphylococcus aureus)    Multiple sclerosis (HCC)    Neuromuscular disorder (HCC)    Patent ductus arteriosus    Regional enteritis of large intestine (HCC)    S/P left hemicolectomy 2015   [facility]-done for stricture   Stricture of descending colon due to Crohn's disease 02/13/2012    PAST SURGICAL HISTORY: Past Surgical History:  Procedure Laterality Date   COLON SURGERY  2015   descending colon stricture    COLONOSCOPY     multiple    FAMILY HISTORY: Family History  Problem Relation Age of Onset   Heart disease Mother    Ulcerative colitis Father    Diabetes Paternal Grandmother    Prostate cancer Paternal Grandfather    Colon cancer Neg Hx    Rectal cancer Neg Hx  Stomach cancer Neg Hx    Colon polyps Neg Hx    Esophageal cancer Neg Hx     SOCIAL HISTORY: Social History   Socioeconomic History   Marital status: Married    Spouse name: Youth worker   Number of children: 3   Years of education: 13   Highest education level: Not on file  Occupational History   Occupation: disabled  Tobacco Use   Smoking status: Former    Packs/day: 1.00    Years: 15.00    Additional pack years: 0.00    Total pack years: 15.00    Types: Cigarettes    Quit date: 10/24/2004    Years since quitting: 18.3   Smokeless tobacco: Never  Vaping Use   Vaping Use: Never used  Substance and Sexual Activity   Alcohol use: Never   Drug use: Never   Sexual activity: Yes  Other Topics Concern   Not on file  Social History Narrative   Married, 1 son one daughter. Wife is Charity fundraiser. Neurosurgeon)   Disabled, previously worked at  [employer], disability due to multiple sclerosis   Caffeine one daily   Left handed.   Education- one year of college.   Social Determinants of Health   Financial Resource Strain: Low Risk  (04/21/2022)   Overall Financial Resource Strain (CARDIA)    Difficulty of Paying Living Expenses: Not hard at all  Food Insecurity: No Food Insecurity (04/21/2022)   Hunger Vital Sign    Worried About Running Out of Food in the Last Year: Never true    Ran Out of Food in the Last Year: Never true  Transportation Needs: No Transportation Needs (04/21/2022)   PRAPARE - Administrator, Civil Service (Medical): No    Lack of Transportation (Non-Medical): No  Physical Activity: Insufficiently Active (04/21/2022)   Exercise Vital Sign    Days of Exercise per Week: 3 days    Minutes of Exercise per Session: 30 min  Stress: No Stress Concern Present (04/21/2022)   [employer] of Occupational Health - Occupational Stress Questionnaire    Feeling of Stress : Not at all  Social Connections: Socially Integrated (04/21/2022)   Social Connection and Isolation Panel [NHANES]    Frequency of Communication with Friends and Family: More than three times a week    Frequency of Social Gatherings with Friends and Family: More than three times a week    Attends Religious Services: More than 4 times per year    Active Member of [employer] or Organizations: Yes    Attends Banker Meetings: More than 4 times per year    Marital Status: Married  Catering manager Violence: Not At Risk (04/21/2022)   Humiliation, Afraid, Rape, and Kick questionnaire    Fear of Current or Ex-Partner: No    Emotionally Abused: No    Physically Abused: No    Sexually Abused: No   Levert Feinstein, M.D. Ph.D.  [facility] [address] [place], [place] [address] Phone: [phone] Fax:      [phone]

## 2023-03-21 ENCOUNTER — Ambulatory Visit: Payer: Medicare Other | Admitting: Neurology

## 2023-05-03 NOTE — Progress Notes (Signed)
Subjective:   Jonathon Vazquez is a 52 y.o. male who presents for Medicare Annual/Subsequent preventive examination.  Visit Complete: {VISITMETHOD:[phone]}  Patient Medicare AWV questionnaire was completed by the patient on ***; I have confirmed that all information answered by patient is correct and no changes since this date.  Review of Systems    ***       Objective:    There were no vitals filed for this visit. There is no height or weight on file to calculate BMI.     11/28/2022    2:34 PM 04/21/2022   10:47 AM 06/24/2018    1:47 AM 09/24/2015    3:55 PM 12/08/2014   11:06 AM  Advanced Directives  Does Patient Have a Medical Advance Directive? No No No No No  Does patient want to make changes to medical advance directive?   No - Patient declined    Would patient like information on creating a medical advance directive? No - Patient declined No - Patient declined No - Patient declined      Current Medications (verified) Outpatient Encounter Medications as of 05/04/2023  Medication Sig   acetaminophen (TYLENOL) 325 MG tablet Take by mouth.   fluticasone (FLONASE) 50 MCG/ACT nasal spray SPRAY 2 SPRAYS INTO EACH NOSTRIL EVERY DAY   MULTIPLE VITAMIN PO Take by mouth.   natalizumab (TYSABRI) 300 MG/15ML injection Inject into the vein. 1 hour infusion monthly   No facility-administered encounter medications on file as of 05/04/2023.    Allergies (verified) Lialda [mesalamine]   History: Past Medical History:  Diagnosis Date   Anal fistula 1996   Anal stenosis    Atrial fibrillation (HCC)    pt and wife deny this   Colon stricture (HCC)    descending colon   Crohn's disease (HCC)    Hx of colonic polyp 10/21/2016   MRSA (methicillin resistant Staphylococcus aureus)    Multiple sclerosis (HCC)    Neuromuscular disorder (HCC)    Patent ductus arteriosus    Regional enteritis of large intestine (HCC)    S/P left hemicolectomy 2015   [facility]-done for stricture    Stricture of descending colon due to Crohn's disease 02/13/2012   Past Surgical History:  Procedure Laterality Date   COLON SURGERY  2015   descending colon stricture    COLONOSCOPY     multiple   Family History  Problem Relation Age of Onset   Heart disease Mother    Ulcerative colitis Father    Diabetes Paternal Grandmother    Prostate cancer Paternal Grandfather    Colon cancer Neg Hx    Rectal cancer Neg Hx    Stomach cancer Neg Hx    Colon polyps Neg Hx    Esophageal cancer Neg Hx    Social History   Socioeconomic History   Marital status: Married    Spouse name: Youth worker   Number of children: 3   Years of education: 13   Highest education level: Not on file  Occupational History   Occupation: disabled  Tobacco Use   Smoking status: Former    Packs/day: 1.00    Years: 15.00    Additional pack years: 0.00    Total pack years: 15.00    Types: Cigarettes    Quit date: 10/24/2004    Years since quitting: 18.5   Smokeless tobacco: Never  Vaping Use   Vaping Use: Never used  Substance and Sexual Activity   Alcohol use: Never   Drug use: Never  Sexual activity: Yes  Other Topics Concern   Not on file  Social History Narrative   Married, 1 son one daughter. Wife is Charity fundraiser. Neurosurgeon)   Disabled, previously worked at [employer], disability due to multiple sclerosis   Caffeine one daily   Left handed.   Education- one year of college.   Social Determinants of Health   Financial Resource Strain: Low Risk  (04/21/2022)   Overall Financial Resource Strain (CARDIA)    Difficulty of Paying Living Expenses: Not hard at all  Food Insecurity: No Food Insecurity (04/21/2022)   Hunger Vital Sign    Worried About Running Out of Food in the Last Year: Never true    Ran Out of Food in the Last Year: Never true  Transportation Needs: No Transportation Needs (04/21/2022)   PRAPARE - Administrator, Civil Service (Medical): No    Lack of Transportation (Non-Medical):  No  Physical Activity: Insufficiently Active (04/21/2022)   Exercise Vital Sign    Days of Exercise per Week: 3 days    Minutes of Exercise per Session: 30 min  Stress: No Stress Concern Present (04/21/2022)   [employer] of Occupational Health - Occupational Stress Questionnaire    Feeling of Stress : Not at all  Social Connections: Socially Integrated (04/21/2022)   Social Connection and Isolation Panel [NHANES]    Frequency of Communication with Friends and Family: More than three times a week    Frequency of Social Gatherings with Friends and Family: More than three times a week    Attends Religious Services: More than 4 times per year    Active Member of [employer] or Organizations: Yes    Attends Engineer, structural: More than 4 times per year    Marital Status: Married    Tobacco Counseling Counseling given: Not Answered   Clinical Intake:                        Activities of Daily Living     No data to display          Patient Care Team: Donita Brooks, MD as PCP - General (Family Medicine) Iva Boop, MD as Consulting Physician (Gastroenterology) Levert Feinstein, MD as Consulting Physician (Neurology)  Indicate any recent Medical Services you may have received from other than [facility] providers in the past year (date may be approximate).     Assessment:   This is a routine wellness examination for Jonathon Vazquez.  Hearing/Vision screen No results found.  Dietary issues and exercise activities discussed:     Goals Addressed   None    Depression Screen    11/07/2022    8:03 AM 04/21/2022   10:46 AM 05/22/2020    2:53 PM 05/28/2018    2:54 PM 03/31/2017   12:22 PM  PHQ 2/9 Scores  PHQ - 2 Score 0 0 0 0 0  PHQ- 9 Score   0  1    Fall Risk    11/07/2022    8:03 AM 04/21/2022   10:48 AM 05/22/2020    2:53 PM 03/31/2017   12:22 PM  Fall Risk   Falls in the past year? 0 0 0 No  Number falls in past yr: 0 0 0   Injury with Fall? 0 0 0    Risk for fall due to : No Fall Risks No Fall Risks    Follow up Falls prevention discussed Falls prevention discussed  MEDICARE RISK AT HOME:   TIMED UP AND GO:  Was the test performed?  No    Cognitive Function:        04/21/2022   10:49 AM  6CIT Screen  What Year? 0 points  What month? 0 points  What time? 0 points  Count back from 20 0 points  Months in reverse 0 points  Repeat phrase 0 points  Total Score 0 points    Immunizations Immunization History  Administered Date(s) Administered   H1N1 10/30/2008   Hepatitis A 08/30/2007, 05/05/2015   IPV 08/30/2007   Influenza,inj,Quad PF,6+ Mos 09/28/2015, 08/15/2016, 09/22/2017, 09/04/2018, 08/28/2019   Moderna Sars-Covid-2 Vaccination 01/12/2020, 02/19/2020   Pneumococcal Conjugate-13 10/03/2016   Pneumococcal Polysaccharide-23 09/28/2015   Td 08/30/2007   Tdap 08/30/2007, 04/30/2018   Typhoid Parenteral, AKD ([place] [employer]) 08/30/2007, 05/05/2015   Yellow Fever 08/30/2007    TDAP status: Up to date  Pneumococcal vaccine status: Up to date  Covid-19 vaccine status: Information provided on how to obtain vaccines.   Qualifies for Shingles Vaccine? Yes   Zostavax completed No   Shingrix Completed?: No.    Education has been provided regarding the importance of this vaccine. Patient has been advised to call insurance company to determine out of pocket expense if they have not yet received this vaccine. Advised may also receive vaccine at local pharmacy or Health Dept. Verbalized acceptance and understanding.  Screening Tests Health Maintenance  Topic Date Due   Zoster Vaccines- Shingrix (1 of 2) Never done   COVID-19 Vaccine (3 - Moderna risk series) 03/18/2020   Medicare Annual Wellness (AWV)  04/22/2023   INFLUENZA VACCINE  05/25/2023   Colonoscopy  08/12/2024   DTaP/Tdap/Td (4 - Td or Tdap) 04/30/2028   Hepatitis C Screening  Completed   HPV VACCINES  Aged Out   HIV Screening  Discontinued    Health  Maintenance  Health Maintenance Due  Topic Date Due   Zoster Vaccines- Shingrix (1 of 2) Never done   COVID-19 Vaccine (3 - Moderna risk series) 03/18/2020   Medicare Annual Wellness (AWV)  04/22/2023    Colorectal cancer screening: Type of screening: Colonoscopy. Completed 08/12/21. Repeat every 3 years  Lung Cancer Screening: (Low Dose CT Chest recommended if Age 65-80 years, 20 pack-year currently smoking OR have quit w/in 15years.) does not qualify.   Lung Cancer Screening Referral: n/a  Additional Screening:  Hepatitis C Screening: does qualify; Completed 05/29/20  Vision Screening: Recommended annual ophthalmology exams for early detection of glaucoma and other disorders of the eye. Is the patient up to date with their annual eye exam?  {YES/NO:21197} Who is the provider or what is the name of the office in which the patient attends annual eye exams? *** If pt is not established with a provider, would they like to be referred to a provider to establish care? {YES/NO:21197}.   Dental Screening: Recommended annual dental exams for proper oral hygiene  Community Resource Referral / Chronic Care Management: CRR required this visit?  {YES/NO:21197}  CCM required this visit?  {CCM Required choices:[phone]}     Plan:     I have personally reviewed and noted the following in the patient's chart:   Medical and social history Use of alcohol, tobacco or illicit drugs  Current medications and supplements including opioid prescriptions. {Opioid [phone]} Functional ability and status Nutritional status Physical activity Advanced directives List of other physicians Hospitalizations, surgeries, and ER visits in previous 12 months Vitals Screenings to include cognitive,  depression, and falls Referrals and appointments  In addition, I have reviewed and discussed with patient certain preventive protocols, quality metrics, and best practice recommendations. A  written personalized care plan for preventive services as well as general preventive health recommendations were provided to patient.     Kandis Fantasia [place], [place]   1/61/0960   After Visit Summary: {CHL AMB AWV After Visit [phone]}  Nurse Notes: ***

## 2023-05-03 NOTE — Patient Instructions (Incomplete)
Jonathon Vazquez , Thank you for taking time to come for your Medicare Wellness Visit. I appreciate your ongoing commitment to your health goals. Please review the following plan we discussed and let me know if I can assist you in the future.   These are the goals we discussed:  Goals      Weight (lb) < 200 lb (90.7 kg)     Would like to get down to 185#        This is a list of the screening recommended for you and due dates:  Health Maintenance  Topic Date Due   Zoster (Shingles) Vaccine (1 of 2) Never done   COVID-19 Vaccine (3 - Moderna risk series) 03/18/2020   Medicare Annual Wellness Visit  04/22/2023   Flu Shot  05/25/2023   Colon Cancer Screening  08/12/2024   DTaP/Tdap/Td vaccine (4 - Td or Tdap) 04/30/2028   Hepatitis C Screening  Completed   HPV Vaccine  Aged Out   HIV Screening  Discontinued    Advanced directives: Information on Advanced Care Planning can be found at [facility] Advance Health Care Directives Advance Health Care Directives ([email or web address] Please bring a copy of your health care power of attorney and living will to the office to be added to your chart at your convenience.  Conditions/risks identified: Aim for 30 minutes of exercise or brisk walking, 6-8 glasses of water, and 5 servings of fruits and vegetables each day.  Next appointment: Follow up in one year for your annual wellness visit   Preventive Care 40-64 Years, Male Preventive care refers to lifestyle choices and visits with your health care provider that can promote health and wellness. What does preventive care include? A yearly physical exam. This is also called an annual well check. Dental exams once or twice a year. Routine eye exams. Ask your health care provider how often you should have your eyes checked. Personal lifestyle choices, including: Daily care of your teeth and gums. Regular physical activity. Eating a healthy diet. Avoiding tobacco and drug use. Limiting  alcohol use. Practicing safe sex. Taking low-dose aspirin every day starting at age 55. What happens during an annual well check? The services and screenings done by your health care provider during your annual well check will depend on your age, overall health, lifestyle risk factors, and family history of disease. Counseling  Your health care provider may ask you questions about your: Alcohol use. Tobacco use. Drug use. Emotional well-being. Home and relationship well-being. Sexual activity. Eating habits. Work and work Astronomer. Screening  You may have the following tests or measurements: Height, weight, and BMI. Blood pressure. Lipid and cholesterol levels. These may be checked every 5 years, or more frequently if you are over 66 years old. Skin check. Lung cancer screening. You may have this screening every year starting at age 46 if you have a 30-pack-year history of smoking and currently smoke or have quit within the past 15 years. Fecal occult blood test (FOBT) of the stool. You may have this test every year starting at age 65. Flexible sigmoidoscopy or colonoscopy. You may have a sigmoidoscopy every 5 years or a colonoscopy every 10 years starting at age 7. Prostate cancer screening. Recommendations will vary depending on your family history and other risks. Hepatitis C blood test. Hepatitis B blood test. Sexually transmitted disease (STD) testing. Diabetes screening. This is done by checking your blood sugar (glucose) after you have not eaten for a while (fasting).  You may have this done every 1-3 years. Discuss your test results, treatment options, and if necessary, the need for more tests with your health care provider. Vaccines  Your health care provider may recommend certain vaccines, such as: Influenza vaccine. This is recommended every year. Tetanus, diphtheria, and acellular pertussis (Tdap, Td) vaccine. You may need a Td booster every 10 years. Zoster vaccine.  You may need this after age 27. Pneumococcal 13-valent conjugate (PCV13) vaccine. You may need this if you have certain conditions and have not been vaccinated. Pneumococcal polysaccharide (PPSV23) vaccine. You may need one or two doses if you smoke cigarettes or if you have certain conditions. Talk to your health care provider about which screenings and vaccines you need and how often you need them. This information is not intended to replace advice given to you by your health care provider. Make sure you discuss any questions you have with your health care provider. Document Released: 11/06/2015 Document Revised: 06/29/2016 Document Reviewed: 08/11/2015 Elsevier Interactive Patient Education  2017 [employer].  Fall Prevention in the Home Falls can cause injuries. They can happen to people of all ages. There are many things you can do to make your home safe and to help prevent falls. What can I do on the outside of my home? Regularly fix the edges of walkways and driveways and fix any cracks. Remove anything that might make you trip as you walk through a door, such as a raised step or threshold. Trim any bushes or trees on the path to your home. Use bright outdoor lighting. Clear any walking paths of anything that might make someone trip, such as rocks or tools. Regularly check to see if handrails are loose or broken. Make sure that both sides of any steps have handrails. Any raised decks and porches should have guardrails on the edges. Have any leaves, snow, or ice cleared regularly. Use sand or salt on walking paths during winter. Clean up any spills in your garage right away. This includes oil or grease spills. What can I do in the bathroom? Use night lights. Install grab bars by the toilet and in the tub and shower. Do not use towel bars as grab bars. Use non-skid mats or decals in the tub or shower. If you need to sit down in the shower, use a plastic, non-slip stool. Keep the  floor dry. Clean up any water that spills on the floor as soon as it happens. Remove soap buildup in the tub or shower regularly. Attach bath mats securely with double-sided non-slip rug tape. Do not have throw rugs and other things on the floor that can make you trip. What can I do in the bedroom? Use night lights. Make sure that you have a light by your bed that is easy to reach. Do not use any sheets or blankets that are too big for your bed. They should not hang down onto the floor. Have a firm chair that has side arms. You can use this for support while you get dressed. Do not have throw rugs and other things on the floor that can make you trip. What can I do in the kitchen? Clean up any spills right away. Avoid walking on wet floors. Keep items that you use a lot in easy-to-reach places. If you need to reach something above you, use a strong step stool that has a grab bar. Keep electrical cords out of the way. Do not use floor polish or wax that makes floors  slippery. If you must use wax, use non-skid floor wax. Do not have throw rugs and other things on the floor that can make you trip. What can I do with my stairs? Do not leave any items on the stairs. Make sure that there are handrails on both sides of the stairs and use them. Fix handrails that are broken or loose. Make sure that handrails are as long as the stairways. Check any carpeting to make sure that it is firmly attached to the stairs. Fix any carpet that is loose or worn. Avoid having throw rugs at the top or bottom of the stairs. If you do have throw rugs, attach them to the floor with carpet tape. Make sure that you have a light switch at the top of the stairs and the bottom of the stairs. If you do not have them, ask someone to add them for you. What else can I do to help prevent falls? Wear shoes that: Do not have high heels. Have rubber bottoms. Are comfortable and fit you well. Are closed at the toe. Do not wear  sandals. If you use a stepladder: Make sure that it is fully opened. Do not climb a closed stepladder. Make sure that both sides of the stepladder are locked into place. Ask someone to hold it for you, if possible. Clearly mark and make sure that you can see: Any grab bars or handrails. First and last steps. Where the edge of each step is. Use tools that help you move around (mobility aids) if they are needed. These include: Canes. Walkers. Scooters. Crutches. Turn on the lights when you go into a dark area. Replace any light bulbs as soon as they burn out. Set up your furniture so you have a clear path. Avoid moving your furniture around. If any of your floors are uneven, fix them. If there are any pets around you, be aware of where they are. Review your medicines with your doctor. Some medicines can make you feel dizzy. This can increase your chance of falling. Ask your doctor what other things that you can do to help prevent falls. This information is not intended to replace advice given to you by your health care provider. Make sure you discuss any questions you have with your health care provider. Document Released: 08/06/2009 Document Revised: 03/17/2016 Document Reviewed: 11/14/2014 Elsevier Interactive Patient Education  2017 [employer].

## 2023-05-04 ENCOUNTER — Ambulatory Visit (INDEPENDENT_AMBULATORY_CARE_PROVIDER_SITE_OTHER): Payer: Medicare Other

## 2023-05-04 VITALS — Ht 71.0 in | Wt 201.0 lb

## 2023-05-04 DIAGNOSIS — Z0000 Encounter for general adult medical examination without abnormal findings: Secondary | ICD-10-CM | POA: Diagnosis not present

## 2023-05-23 ENCOUNTER — Other Ambulatory Visit: Payer: Self-pay | Admitting: Family Medicine

## 2023-05-24 NOTE — Telephone Encounter (Signed)
Last OV 11/07/22 Requested Prescriptions  Pending Prescriptions Disp Refills   fluticasone (FLONASE) 50 MCG/ACT nasal spray [Pharmacy Med Name: FLUTICASONE PROP 50 MCG SPRAY] 48 mL 3    Sig: SPRAY 2 SPRAYS INTO EACH NOSTRIL EVERY DAY     Ear, Nose, and Throat: Nasal Preparations - Corticosteroids Failed - 05/23/2023  4:35 PM      Failed - Valid encounter within last 12 months    Recent Outpatient Visits           1 year ago General medical exam   [facility] Donita Brooks, MD   3 years ago Prostate cancer screening   [facility] Donita Brooks, MD   3 years ago Visit for suture removal   [facility] Donita Brooks, MD   3 years ago Mass of chest wall, right   [facility] Pickard, Priscille Heidelberg, MD   3 years ago Mass in chest   [facility] Pickard, Priscille Heidelberg, MD

## 2023-08-02 ENCOUNTER — Other Ambulatory Visit: Payer: Self-pay

## 2023-08-02 ENCOUNTER — Telehealth: Payer: Self-pay

## 2023-08-02 DIAGNOSIS — Z79899 Other long term (current) drug therapy: Secondary | ICD-10-CM

## 2023-08-02 DIAGNOSIS — G35 Multiple sclerosis: Secondary | ICD-10-CM

## 2023-08-02 NOTE — Addendum Note (Signed)
Addended by: Deatra James on: 08/02/2023 04:49 PM   Modules accepted: Orders

## 2023-08-02 NOTE — Telephone Encounter (Signed)
JCV labs placed in box outside for pick up

## 2023-08-03 LAB — CBC WITH DIFFERENTIAL/PLATELET
Basophils Absolute: 0.1 10*3/uL (ref 0.0–0.2)
Basos: 1 %
EOS (ABSOLUTE): 0.3 10*3/uL (ref 0.0–0.4)
Eos: 4 %
Hematocrit: 41.1 % (ref 37.5–51.0)
Hemoglobin: 13.7 g/dL (ref 13.0–17.7)
Immature Grans (Abs): 0.1 10*3/uL (ref 0.0–0.1)
Immature Granulocytes: 1 %
Lymphocytes Absolute: 2.7 10*3/uL (ref 0.7–3.1)
Lymphs: 40 %
MCH: 28.4 pg (ref 26.6–33.0)
MCHC: 33.3 g/dL (ref 31.5–35.7)
MCV: 85 fL (ref 79–97)
Monocytes Absolute: 0.4 10*3/uL (ref 0.1–0.9)
Monocytes: 6 %
NRBC: 1 % — ABNORMAL HIGH (ref 0–0)
Neutrophils Absolute: 3.3 10*3/uL (ref 1.4–7.0)
Neutrophils: 48 %
Platelets: 151 10*3/uL (ref 150–450)
RBC: 4.83 x10E6/uL (ref 4.14–5.80)
RDW: 12.8 % (ref 11.6–15.4)
WBC: 6.8 10*3/uL (ref 3.4–10.8)

## 2023-08-03 LAB — SPECIMEN STATUS REPORT

## 2023-09-13 NOTE — Progress Notes (Unsigned)
ASSESSMENT AND PLAN 52 y.o. year old male   1.  Relapsing remitting multiple sclerosis 2.  Crohn's disease, no exacerbations 3.  Chronic insomnia -Overall doing well, no worsening symptoms -On Tysabri since December 2016, will continue this -JCV virus antibody on October 2024 0.24, negative -Check MRI of the brain with and without contrast for MS surveillance -Refill clonazepam 0.5 mg as needed at bedtime -Follow-up in 6 months or sooner if needed  Orders Placed This Encounter  Procedures   MR BRAIN W WO CONTRAST   CMP    DIAGNOSTIC DATA (LABS, IMAGING, TESTING) - I reviewed patient records, labs, notes, testing and imaging myself where available.   Anti-JC virus antibody was negative with a titer of 0.25 in October 2023  MRI of the brain with and without contrast in Nov 2023: Multiple T2/FLAIR hyperintense foci in the brainstem, cerebellum, left thalamus and cerebral hemispheres in a pattern consistent with chronic demyelinating plaque associated with multiple sclerosis.  None of the foci enhanced or appear to be acute.  Compared to the MRI from 03/24/2021, there were no new lesions. No acute findings.  Normal enhancement pattern.  MRI of cervical spine w/wo in Nov 2023  Chronic demyelinating lesions involving the cervical cord at C2 and C4-5, not significantly changed or progressed as compared to previous MRI from 2015. No evidence for active demyelination. 2. Left paracentral disc protrusions at C5-6 and C6-7 with resultant mild cord flattening, but no significant spinal stenosis.   HISTORY:  He was seen by his [facility] since 2000, initially presenting with facial numbness, MRI demonstrates lesions consistent with MS, however he has lost followups, was suggested Copaxone in 2004, but never followed through, he was reevaluated by Dr. Lissa Morales in February of 2008, he began to have bladder incontinence, dizziness, blurry vision, unbalanced gait, repeat MRI at that time continued to  demonstrate multiple foci of signal hyperintensity, involving brainstem, superior peduncle, deep white matters, there was pericallosal involvement, no enhancement, but there was positive DWIs lesion, he was started on Avenox since 06/13/2007,  he continued to have flulike illness following injection,   He also reported a history of Crohn's disease, last treatment was in 2004 with Ascol, he no longer has significant GI symptoms, He was treated with immunosuppressive treatment mercaptopurine in late 1990s, for 3-4 months, still had diahrrea.   He had one to 2 flareups each year,  presenting with increased dizziness, unbalanced gait, blurry vision, worsening memory trouble, often partial improvement with steroid package.   Most recent MRI cervical was in July 2011, remote demyelinating plaque at C2 and C4, no enhancing lesions.   Repeat MRI brain and cervical in Jan 2013 showed a defined hyperintensity in the spinal cord at C2 which likely represents remote age inactive demyelinating plaque. No enhancing lesions are noted. As compared with previous MRI dated 04/30/2010 the C4 spinal cord hyperintensity is no longer seen.   MRI scan of the brain showed multiple brainstem, cerebellar, corpus callosal and periventricular white matter hyperintensities consistent with multiple sclerosis. No enhancing lesions are noted. Presence of T1 black holes  andd corpus callosal atrophy indicate chronic disease. Overall no significant changes compared with MRI from 01/23/2007    He is doing very well, there was no new neurological symptoms, he is taking Avonex, mild flulike illness right out of the injection, improved by Tylenol, or aleve.  He complains of frequent bilateral frontal headaches.   We have reviewed MRI of the brain, which has demonstrated multiple periventricular  and subcortical and infratentorial chronic demyelinating plaques. No acute plaques. Compared to prior MRI on 01/23/07, there are has been disease  progression.   He continues to do very well, reported prior to MRI scan, he has been off of his Avonex for 4 months, due to insurance reasons, after discussed with Mr. Radford we decided to stay on current Avonex treatment, overall only slight change in his MRI scan there is no significant clinical worsening, we will repeat MRI of the brain and cervical spine with and without contrast in one year.   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 Leone Payor, he has been treated with prednisone, mercaptopurine in 2011 150mg  daily for one year, ( which is a nucleoside metabolic inhibitor that results in cell-cycle arrest and death) and mesalamine in the past,   We decided not to proceed with Silvestre Moment,  Keep him on Avonex he complains of 3 weeks history of left arm numbness, since January 2015, has much improved now,   Update September 15, 2021: Patient return for electrodiagnostic study today, showed no evidence of left upper extremity neuropathy, the tenderness is more at the left radial styloid, happened after excessive use of left hand,   We again reviewed multiple MRI of the brain, cervical spine in the past, reviewed his MS history, had worsening neurological symptoms, from 20 14-20 16, correlate with worsening Crohn's disease, there was also evidence of mild progression by MRI standard  His symptoms overall has much improved with Tysabri treatment since December 2016, he tolerated infusion every 4 weeks well, JC virus titer remains at the low side, 0.25 in October 2022  Patient's and wife quoted recent biogen study for extending Tysabri infusion to every 6 weeks, to avoid long-term infusion side  effect, especially the worrisome PML, despite the fact that he is at the lower risk group,  We went over published data from Lancet neurology in April 2022 [email or web address]/journals/laneur/[email or web address]   "We found a numerical difference in the mean number of new or newly enlarging T2 hyperintense lesions at week 72 between the once every 6 weeks and once every 4 weeks groups, which reached significance under the secondary estimand, but interpretation of statistical differences (or absence thereof) is limited because disease activity in the once every 4 weeks group was lower than expected. The safety profiles of natalizumab once every 6 weeks and once every 4 weeks were similar. Although this trial was not powered to assess differences in risk of progressive multifocal leukoencephalopathy, the occurrence of the (asymptomatic) case underscores the importance of monitoring and risk factor consideration in all patients receiving natalizumab."  UPDATE Feb 28 2023: He is accompanied by his wife at today's clinical visit, remained on Tysabri every 4 weeks, doing very well, Crohn's disease is in remission as well, no new MS flareup  He has mild fatigue, some improvement with workout  JC virus titer 0.25, negative on August 17, 2022  We personally reviewed and compared MRI of the brain August 25, 2022, multiple T2/FLAIR hyperintensity signal in the brainstem, cerebellum, left thalamus, cerebral hemisphere, no change compared to previous MRIs  MRI of cervical spine showed chronic demyelinating lesions involving cervical cord at C2, C4-5, no significant change compared to previous  scan, no evidence of contrast-enhancement  Update September 14, 2023 SS: remains on Tysabri, tolerating well. Feels MS is stable. Sometimes problems with balance. Dr. Terrace Arabia has given Klonopin as needed, last fill was march 2023, would like refill for insomnia. [facility], educational.  Getting physical soon.  JCV October 2024 0.24 indeterminate, inhibition assay negative. CBC normal.  REVIEW OF SYSTEMS: Out of a complete 14 system review of symptoms, the patient complains only of the following symptoms, and all other reviewed systems are negative.  See HPI  PHYSICAL EXAM  Vitals:   02/28/23 1504  BP: 128/78  Weight: 201 lb (91.2 kg)  Height: 5\' 11"  (1.803 m)   Body mass index is 28.03 kg/m.  PHYSICAL EXAMNIATION:  General: The patient is alert and cooperative at the time of the examination.  Skin: No significant peripheral edema is noted.  Neurologic Exam  Mental status: The patient is alert and oriented x 3 at the time of the examination. The patient has apparent normal recent and remote memory, with an apparently normal attention span and concentration ability.  Cranial nerves: Facial symmetry is present. Speech is normal, no aphasia or dysarthria is noted. Extraocular movements are full. Visual fields are full.  Motor: The patient has good strength in all 4 extremities, slightly decreased to right upper extremity  Sensory examination: Soft touch sensation is symmetric on the face, arms, and legs.  Coordination: The patient has good finger-nose-finger and heel-to-shin bilaterally.  Gait and station: The patient has a normal gait. Tandem gait is slightly unsteady.  Reflexes: Deep tendon reflexes are symmetric.  ALLERGIES: Allergies  Allergen Reactions   Lialda [Mesalamine] Diarrhea    Severe diarrhea    HOME MEDICATIONS: Outpatient Medications Prior to Visit  Medication Sig Dispense Refill   acetaminophen (TYLENOL) 325 MG tablet Take by mouth.     fluticasone (FLONASE) 50 MCG/ACT nasal spray SPRAY 2 SPRAYS INTO EACH NOSTRIL EVERY DAY 48 mL 1   MULTIPLE VITAMIN PO Take by mouth.     natalizumab (TYSABRI) 300 MG/15ML injection Inject into the vein. 1 hour infusion monthly     No facility-administered medications prior to visit.    PAST  MEDICAL HISTORY: Past Medical History:  Diagnosis Date   Anal fistula 1996   Anal stenosis    Atrial fibrillation (HCC)    pt and wife deny this   Colon stricture (HCC)    descending colon   Crohn's disease (HCC)    Hx of colonic polyp 10/21/2016   MRSA (methicillin resistant Staphylococcus aureus)    Multiple sclerosis (HCC)    Neuromuscular disorder (HCC)    Patent ductus arteriosus    Regional enteritis of large intestine (HCC)    S/P left hemicolectomy 2015   [facility]-done for stricture   Stricture of descending colon due to Crohn's disease 02/13/2012    PAST SURGICAL HISTORY: Past Surgical History:  Procedure Laterality Date   COLON SURGERY  2015   descending colon stricture    COLONOSCOPY     multiple    FAMILY HISTORY: Family History  Problem Relation Age of Onset   Heart disease Mother    Ulcerative colitis Father    Diabetes Paternal Grandmother    Prostate cancer Paternal Grandfather    Colon cancer Neg Hx    Rectal cancer Neg Hx    Stomach cancer Neg Hx    Colon polyps Neg Hx    Esophageal cancer Neg Hx     SOCIAL HISTORY:  Social History   Socioeconomic History   Marital status: Married    Spouse name: [name]   Number of children: 3   Years of education: 13   Highest education level: Not on file  Occupational History   Occupation: disabled  Tobacco Use   Smoking status: Former    Current packs/day: 0.00    Average packs/day: 1 pack/day for 15.0 years (15.0 ttl pk-yrs)    Types: Cigarettes    Start date: 10/24/1989    Quit date: 10/24/2004    Years since quitting: 18.9   Smokeless tobacco: Never  Vaping Use   Vaping status: Never Used  Substance and Sexual Activity   Alcohol use: Never   Drug use: Never   Sexual activity: Yes  Other Topics Concern   Not on file  Social History Narrative   Married, 1 son one daughter. Wife is Charity fundraiser. Neurosurgeon)   Disabled, previously worked at [employer], disability due to multiple sclerosis   Caffeine one  daily   Left handed.   Education- one year of college.   Social Determinants of Health   Financial Resource Strain: Low Risk  (05/04/2023)   Overall Financial Resource Strain (CARDIA)    Difficulty of Paying Living Expenses: Not hard at all  Food Insecurity: No Food Insecurity (05/04/2023)   Hunger Vital Sign    Worried About Running Out of Food in the Last Year: Never true    Ran Out of Food in the Last Year: Never true  Transportation Needs: No Transportation Needs (05/04/2023)   PRAPARE - Administrator, Civil Service (Medical): No    Lack of Transportation (Non-Medical): No  Physical Activity: Insufficiently Active (05/04/2023)   Exercise Vital Sign    Days of Exercise per Week: 3 days    Minutes of Exercise per Session: 30 min  Stress: No Stress Concern Present (05/04/2023)   [employer] of Occupational Health - Occupational Stress Questionnaire    Feeling of Stress : Not at all  Social Connections: Socially Integrated (05/04/2023)   Social Connection and Isolation Panel [NHANES]    Frequency of Communication with Friends and Family: More than three times a week    Frequency of Social Gatherings with Friends and Family: Three times a week    Attends Religious Services: More than 4 times per year    Active Member of Clubs or Organizations: Yes    Attends Banker Meetings: More than 4 times per year    Marital Status: Married  Catering manager Violence: Not At Risk (05/04/2023)   Humiliation, Afraid, Rape, and Kick questionnaire    Fear of Current or Ex-Partner: No    Emotionally Abused: No    Physically Abused: No    Sexually Abused: No   Otila Kluver, DNP  [facility] [address] [place], [place] [address] [phone]

## 2023-09-14 ENCOUNTER — Ambulatory Visit: Payer: Medicare Other | Admitting: Neurology

## 2023-09-14 ENCOUNTER — Encounter: Payer: Self-pay | Admitting: Neurology

## 2023-09-14 VITALS — BP 130/68 | Ht 71.0 in | Wt 204.0 lb

## 2023-09-14 DIAGNOSIS — G35 Multiple sclerosis: Secondary | ICD-10-CM

## 2023-09-14 DIAGNOSIS — G4700 Insomnia, unspecified: Secondary | ICD-10-CM | POA: Diagnosis not present

## 2023-09-14 MED ORDER — CLONAZEPAM 0.5 MG PO TABS: 0.5000 mg | ORAL_TABLET | Freq: Every evening | ORAL | 1 refills | Status: AC | PRN

## 2023-09-14 NOTE — Patient Instructions (Signed)
Continue Tysabri infusions Continue exercise Check MRI of the brain with and without contrast I refilled Klonopin, take only as needed Call for worsening symptoms, follow-up in 6 months  Meds ordered this encounter  Medications   clonazePAM (KLONOPIN) 0.5 MG tablet    Sig: Take 1 tablet (0.5 mg total) by mouth at bedtime as needed for anxiety.    Dispense:  30 tablet    Refill:  1

## 2023-09-15 LAB — COMPREHENSIVE METABOLIC PANEL
ALT: 14 [IU]/L (ref 0–44)
AST: 15 [IU]/L (ref 0–40)
Albumin: 4.4 g/dL (ref 3.8–4.9)
Alkaline Phosphatase: 85 [IU]/L (ref 44–121)
BUN/Creatinine Ratio: 9 (ref 9–20)
BUN: 10 mg/dL (ref 6–24)
Bilirubin Total: 1 mg/dL (ref 0.0–1.2)
CO2: 24 mmol/L (ref 20–29)
Calcium: 9.2 mg/dL (ref 8.7–10.2)
Chloride: 104 mmol/L (ref 96–106)
Creatinine, Ser: 1.17 mg/dL (ref 0.76–1.27)
Globulin, Total: 2.3 g/dL (ref 1.5–4.5)
Glucose: 81 mg/dL (ref 70–99)
Potassium: 4.2 mmol/L (ref 3.5–5.2)
Sodium: 142 mmol/L (ref 134–144)
Total Protein: 6.7 g/dL (ref 6.0–8.5)
eGFR: 75 mL/min/{1.73_m2} (ref >59)

## 2023-09-19 ENCOUNTER — Telehealth: Payer: Self-pay | Admitting: Neurology

## 2023-09-19 NOTE — Telephone Encounter (Signed)
no auth required sent to GI [id number]

## 2023-09-25 ENCOUNTER — Other Ambulatory Visit: Payer: Medicare Other

## 2023-09-26 ENCOUNTER — Other Ambulatory Visit: Payer: Medicare Other

## 2023-09-26 DIAGNOSIS — Z13220 Encounter for screening for lipoid disorders: Secondary | ICD-10-CM

## 2023-09-26 DIAGNOSIS — Z125 Encounter for screening for malignant neoplasm of prostate: Secondary | ICD-10-CM

## 2023-09-26 DIAGNOSIS — K5000 Crohn's disease of small intestine without complications: Secondary | ICD-10-CM

## 2023-09-26 DIAGNOSIS — G35 Multiple sclerosis: Secondary | ICD-10-CM

## 2023-09-26 DIAGNOSIS — Q250 Patent ductus arteriosus: Secondary | ICD-10-CM

## 2023-09-26 DIAGNOSIS — Z79899 Other long term (current) drug therapy: Secondary | ICD-10-CM

## 2023-09-27 LAB — CBC WITH DIFFERENTIAL/PLATELET
Absolute Lymphocytes: 2944 {cells}/uL (ref 850–3900)
Absolute Monocytes: 535 {cells}/uL (ref 200–950)
Basophils Absolute: 53 {cells}/uL (ref 0–200)
Basophils Relative: 0.8 %
Eosinophils Absolute: 330 {cells}/uL (ref 15–500)
Eosinophils Relative: 5 %
HCT: 42.5 % (ref 38.5–50.0)
Hemoglobin: 14.3 g/dL (ref 13.2–17.1)
MCH: 28.5 pg (ref 27.0–33.0)
MCHC: 33.6 g/dL (ref 32.0–36.0)
MCV: 84.8 fL (ref 80.0–100.0)
MPV: 11.2 fL (ref 7.5–12.5)
Monocytes Relative: 8.1 %
Neutro Abs: 2739 {cells}/uL (ref 1500–7800)
Neutrophils Relative %: 41.5 %
Platelets: 159 10*3/uL (ref 140–400)
RBC: 5.01 10*6/uL (ref 4.20–5.80)
RDW: 12.9 % (ref 11.0–15.0)
Total Lymphocyte: 44.6 %
WBC: 6.6 10*3/uL (ref 3.8–10.8)

## 2023-09-27 LAB — COMPLETE METABOLIC PANEL WITH GFR
AG Ratio: 1.7 (calc) (ref 1.0–2.5)
ALT: 18 U/L (ref 9–46)
AST: 17 U/L (ref 10–35)
Albumin: 4.4 g/dL (ref 3.6–5.1)
Alkaline phosphatase (APISO): 86 U/L (ref 35–144)
BUN: 12 mg/dL (ref 7–25)
CO2: 30 mmol/L (ref 20–32)
Calcium: 9.5 mg/dL (ref 8.6–10.3)
Chloride: 102 mmol/L (ref 98–110)
Creat: 1.19 mg/dL (ref 0.70–1.30)
Globulin: 2.6 g/dL (ref 1.9–3.7)
Glucose, Bld: 101 mg/dL — ABNORMAL HIGH (ref 65–99)
Potassium: 4.3 mmol/L (ref 3.5–5.3)
Sodium: 140 mmol/L (ref 135–146)
Total Bilirubin: 1.1 mg/dL (ref 0.2–1.2)
Total Protein: 7 g/dL (ref 6.1–8.1)
eGFR: 73 mL/min/{1.73_m2} (ref >=60)

## 2023-09-27 LAB — PSA: PSA: 1.19 ng/mL (ref <=4.00)

## 2023-09-27 LAB — LIPID PANEL
Cholesterol: 207 mg/dL — ABNORMAL HIGH (ref <200)
HDL: 55 mg/dL (ref >=40)
LDL Cholesterol (Calc): 132 mg/dL — ABNORMAL HIGH
Non-HDL Cholesterol (Calc): 152 mg/dL — ABNORMAL HIGH (ref <130)
Total CHOL/HDL Ratio: 3.8 (calc) (ref <5.0)
Triglycerides: 96 mg/dL (ref <150)

## 2023-09-28 ENCOUNTER — Ambulatory Visit: Payer: Medicare Other | Admitting: Family Medicine

## 2023-09-28 ENCOUNTER — Encounter: Payer: Self-pay | Admitting: Family Medicine

## 2023-09-28 VITALS — BP 120/64 | HR 80 | Temp 98.2°F | Ht 71.0 in | Wt 201.8 lb

## 2023-09-28 DIAGNOSIS — Z125 Encounter for screening for malignant neoplasm of prostate: Secondary | ICD-10-CM

## 2023-09-28 DIAGNOSIS — Z13220 Encounter for screening for lipoid disorders: Secondary | ICD-10-CM | POA: Diagnosis not present

## 2023-09-28 DIAGNOSIS — Z23 Encounter for immunization: Secondary | ICD-10-CM | POA: Diagnosis not present

## 2023-09-28 DIAGNOSIS — Z0000 Encounter for general adult medical examination without abnormal findings: Secondary | ICD-10-CM

## 2023-09-28 NOTE — Progress Notes (Signed)
Subjective:    Patient ID: Jonathon Vazquez, male    DOB: 1971/07/30, 52 y.o.   MRN: [id number]  HPI Patient is a 52 year old African-American male who is here today for complete physical exam. Past medical history is significant for multiple sclerosis as well as Crohn's colitis on chronic immunosuppressive therapy. In 2015 he underwent partial resection of the colon. Last colonoscopy was 10/22 and was clear.  His most recent PSA was excellent.  He is due for a flu shot which she would like to get today.  He is due for shingles vaccine as well as a COVID shot.  He politely declines these.  He denies any chest pain or shortness of breath or dyspnea on exertion.  He does report nocturia in the evening.  He denies any hematuria or dysuria. Immunization History  Administered Date(s) Administered   H1N1 10/30/2008   Hepatitis A 08/30/2007, 05/05/2015   IPV 08/30/2007   Influenza,inj,Quad PF,6+ Mos 09/28/2015, 08/15/2016, 09/22/2017, 09/04/2018, 08/28/2019   Moderna Sars-Covid-2 Vaccination 01/12/2020, 02/19/2020   Pneumococcal Conjugate-13 10/03/2016   Pneumococcal Polysaccharide-23 09/28/2015   Td 08/30/2007   Tdap 08/30/2007, 04/30/2018, 12/24/2022   Typhoid Parenteral, AKD ([place] [employer]) 08/30/2007, 05/05/2015   Yellow Fever 08/30/2007  Most recent labs were: Lab on 09/26/2023  Component Date Value Ref Range Status   WBC 09/26/2023 6.6  3.8 - 10.8 Thousand/uL Final   RBC 09/26/2023 5.01  4.20 - 5.80 Million/uL Final   Hemoglobin 09/26/2023 14.3  13.2 - 17.1 g/dL Final   HCT 40/98/1191 42.5  38.5 - 50.0 % Final   MCV 09/26/2023 84.8  80.0 - 100.0 fL Final   MCH 09/26/2023 28.5  27.0 - 33.0 pg Final   MCHC 09/26/2023 33.6  32.0 - 36.0 g/dL Final   Comment: For adults, a slight decrease in the calculated MCHC value (in the range of 30 to 32 g/dL) is most likely not clinically significant; however, it should be interpreted with caution in correlation with other red cell parameters and the  patient's clinical condition.    RDW 09/26/2023 12.9  11.0 - 15.0 % Final   Platelets 09/26/2023 159  140 - 400 Thousand/uL Final   MPV 09/26/2023 11.2  7.5 - 12.5 fL Final   Neutro Abs 09/26/2023 2,739  1,500 - 7,800 cells/uL Final   Absolute Lymphocytes 09/26/2023 2,944  850 - 3,900 cells/uL Final   Absolute Monocytes 09/26/2023 535  200 - 950 cells/uL Final   Eosinophils Absolute 09/26/2023 330  15 - 500 cells/uL Final   Basophils Absolute 09/26/2023 53  0 - 200 cells/uL Final   Neutrophils Relative % 09/26/2023 41.5  % Final   Total Lymphocyte 09/26/2023 44.6  % Final   Monocytes Relative 09/26/2023 8.1  % Final   Eosinophils Relative 09/26/2023 5.0  % Final   Basophils Relative 09/26/2023 0.8  % Final   Glucose, Bld 09/26/2023 101 (H)  65 - 99 mg/dL Final   Comment: .            Fasting reference interval . For someone without known diabetes, a glucose value between 100 and 125 mg/dL is consistent with prediabetes and should be confirmed with a follow-up test. .    BUN 09/26/2023 12  7 - 25 mg/dL Final   Creat 47/82/9562 1.19  0.70 - 1.30 mg/dL Final   eGFR 13/05/6577 73  > OR = 60 mL/min/1.74m2 Final   BUN/Creatinine Ratio 09/26/2023 SEE NOTE:  6 - 22 (calc) Final   Comment:  Not Reported: BUN and Creatinine are within    reference range. .    Sodium 09/26/2023 140  135 - 146 mmol/L Final   Potassium 09/26/2023 4.3  3.5 - 5.3 mmol/L Final   Chloride 09/26/2023 102  98 - 110 mmol/L Final   CO2 09/26/2023 30  20 - 32 mmol/L Final   Calcium 09/26/2023 9.5  8.6 - 10.3 mg/dL Final   Total Protein 19/14/7829 7.0  6.1 - 8.1 g/dL Final   Albumin 56/21/3086 4.4  3.6 - 5.1 g/dL Final   Globulin 57/84/6962 2.6  1.9 - 3.7 g/dL (calc) Final   AG Ratio 09/26/2023 1.7  1.0 - 2.5 (calc) Final   Total Bilirubin 09/26/2023 1.1  0.2 - 1.2 mg/dL Final   Alkaline phosphatase (APISO) 09/26/2023 86  35 - 144 U/L Final   AST 09/26/2023 17  10 - 35 U/L Final   ALT 09/26/2023 18  9 - 46  U/L Final   Cholesterol 09/26/2023 207 (H)  <200 mg/dL Final   HDL 95/28/4132 55  > OR = 40 mg/dL Final   Triglycerides 44/10/270 96  <150 mg/dL Final   LDL Cholesterol (Calc) 09/26/2023 132 (H)  mg/dL (calc) Final   Comment: Reference range: <100 . Desirable range <100 mg/dL for primary prevention;   <70 mg/dL for patients with CHD or diabetic patients  with > or = 2 CHD risk factors. Marland Kitchen LDL-C is now calculated using the Martin-Hopkins  calculation, which is a validated novel method providing  better accuracy than the Friedewald equation in the  estimation of LDL-C.  Horald Pollen et al. Lenox Ahr. 5366;440(34): 2061-2068  (http://education.[facility].com/faq/FAQ164)    Total CHOL/HDL Ratio 09/26/2023 3.8  <7.4 (calc) Final   Non-HDL Cholesterol (Calc) 09/26/2023 152 (H)  <130 mg/dL (calc) Final   Comment: For patients with diabetes plus 1 major ASCVD risk  factor, treating to a non-HDL-C goal of <100 mg/dL  (LDL-C of <25 mg/dL) is considered a therapeutic  option.    PSA 09/26/2023 1.19  < OR = 4.00 ng/mL Final   Comment: The total PSA value from this assay system is  standardized against the WHO standard. The test  result will be approximately 20% lower when compared  to the equimolar-standardized total PSA (Beckman  Coulter). Comparison of serial PSA results should be  interpreted with this fact in mind. . This test was performed using the Siemens  chemiluminescent method. Values obtained from  different assay methods cannot be used interchangeably. PSA levels, regardless of value, should not be interpreted as absolute evidence of the presence or absence of disease.   Office Visit on 09/14/2023  Component Date Value Ref Range Status   Glucose 09/14/2023 81  70 - 99 mg/dL Final   BUN 95/63/8756 10  6 - 24 mg/dL Final   Creatinine, Ser 09/14/2023 1.17  0.76 - 1.27 mg/dL Final   eGFR 43/32/9518 75  >59 mL/min/1.73 Final   BUN/Creatinine Ratio 09/14/2023 9  9 - 20 Final    Sodium 09/14/2023 142  134 - 144 mmol/L Final   Potassium 09/14/2023 4.2  3.5 - 5.2 mmol/L Final   Chloride 09/14/2023 104  96 - 106 mmol/L Final   CO2 09/14/2023 24  20 - 29 mmol/L Final   Calcium 09/14/2023 9.2  8.7 - 10.2 mg/dL Final   Total Protein 84/16/6063 6.7  6.0 - 8.5 g/dL Final   Albumin 01/60/1093 4.4  3.8 - 4.9 g/dL Final   Globulin, Total 09/14/2023 2.3  1.5 -  4.5 g/dL Final   Bilirubin Total 09/14/2023 1.0  0.0 - 1.2 mg/dL Final   Alkaline Phosphatase 09/14/2023 85  44 - 121 IU/L Final   AST 09/14/2023 15  0 - 40 IU/L Final   ALT 09/14/2023 14  0 - 44 IU/L Final    Past Medical History:  Diagnosis Date   Anal fistula 1996   Anal stenosis    Atrial fibrillation (HCC)    pt and wife deny this   Colon stricture (HCC)    descending colon   Crohn's disease (HCC)    Hx of colonic polyp 10/21/2016   MRSA (methicillin resistant Staphylococcus aureus)    Multiple sclerosis (HCC)    Neuromuscular disorder (HCC)    Patent ductus arteriosus    Regional enteritis of large intestine (HCC)    S/P left hemicolectomy 2015   [facility]-done for stricture   Stricture of descending colon due to Crohn's disease 02/13/2012   Past Surgical History:  Procedure Laterality Date   COLON SURGERY  2015   descending colon stricture    COLONOSCOPY     multiple   Current Outpatient Medications on File Prior to Visit  Medication Sig Dispense Refill   acetaminophen (TYLENOL) 325 MG tablet Take by mouth.     clonazePAM (KLONOPIN) 0.5 MG tablet Take 1 tablet (0.5 mg total) by mouth at bedtime as needed for anxiety. 30 tablet 1   fluticasone (FLONASE) 50 MCG/ACT nasal spray SPRAY 2 SPRAYS INTO EACH NOSTRIL EVERY DAY 48 mL 1   MULTIPLE VITAMIN PO Take by mouth.     natalizumab (TYSABRI) 300 MG/15ML injection Inject into the vein. 1 hour infusion monthly     No current facility-administered medications on file prior to visit.   Allergies  Allergen Reactions   Lialda [Mesalamine] Diarrhea     Severe diarrhea   Social History   Socioeconomic History   Marital status: Married    Spouse name: Youth worker   Number of children: 3   Years of education: 13   Highest education level: Not on file  Occupational History   Occupation: disabled  Tobacco Use   Smoking status: Former    Current packs/day: 0.00    Average packs/day: 1 pack/day for 15.0 years (15.0 ttl pk-yrs)    Types: Cigarettes    Start date: 10/24/1989    Quit date: 10/24/2004    Years since quitting: 18.9   Smokeless tobacco: Never  Vaping Use   Vaping status: Never Used  Substance and Sexual Activity   Alcohol use: Never   Drug use: Never   Sexual activity: Yes  Other Topics Concern   Not on file  Social History Narrative   Married, 1 son one daughter. Wife is Charity fundraiser. Neurosurgeon)   Disabled, previously worked at [employer], disability due to multiple sclerosis   Caffeine one daily   Left handed.   Education- one year of college.   Social Determinants of Health   Financial Resource Strain: Low Risk  (05/04/2023)   Overall Financial Resource Strain (CARDIA)    Difficulty of Paying Living Expenses: Not hard at all  Food Insecurity: No Food Insecurity (05/04/2023)   Hunger Vital Sign    Worried About Running Out of Food in the Last Year: Never true    Ran Out of Food in the Last Year: Never true  Transportation Needs: No Transportation Needs (05/04/2023)   PRAPARE - Administrator, Civil Service (Medical): No    Lack of Transportation (Non-Medical): No  Physical Activity: Insufficiently Active (05/04/2023)   Exercise Vital Sign    Days of Exercise per Week: 3 days    Minutes of Exercise per Session: 30 min  Stress: No Stress Concern Present (05/04/2023)   [employer] of Occupational Health - Occupational Stress Questionnaire    Feeling of Stress : Not at all  Social Connections: Socially Integrated (05/04/2023)   Social Connection and Isolation Panel [NHANES]    Frequency of Communication with  Friends and Family: More than three times a week    Frequency of Social Gatherings with Friends and Family: Three times a week    Attends Religious Services: More than 4 times per year    Active Member of Clubs or Organizations: Yes    Attends Banker Meetings: More than 4 times per year    Marital Status: Married  Catering manager Violence: Not At Risk (05/04/2023)   Humiliation, Afraid, Rape, and Kick questionnaire    Fear of Current or Ex-Partner: No    Emotionally Abused: No    Physically Abused: No    Sexually Abused: No   Family History  Problem Relation Age of Onset   Heart disease Mother    Ulcerative colitis Father    Diabetes Paternal Grandmother    Prostate cancer Paternal Grandfather    Colon cancer Neg Hx    Rectal cancer Neg Hx    Stomach cancer Neg Hx    Colon polyps Neg Hx    Esophageal cancer Neg Hx      Review of Systems  All other systems reviewed and are negative.      Objective:   Physical Exam Vitals reviewed.  Constitutional:      General: He is not in acute distress.    Appearance: He is well-developed. He is not diaphoretic.  HENT:     Head: Normocephalic and atraumatic.     Right Ear: External ear normal.     Left Ear: External ear normal.     Nose: Nose normal.     Mouth/Throat:     Pharynx: No oropharyngeal exudate.  Eyes:     General: No scleral icterus.       Right eye: No discharge.        Left eye: No discharge.     Conjunctiva/sclera: Conjunctivae normal.     Pupils: Pupils are equal, round, and reactive to light.  Neck:     Thyroid: No thyromegaly.     Vascular: No JVD.     Trachea: No tracheal deviation.  Cardiovascular:     Rate and Rhythm: Normal rate and regular rhythm.     Heart sounds: Normal heart sounds. No murmur heard.    No friction rub. No gallop.  Pulmonary:     Effort: Pulmonary effort is normal. No respiratory distress.     Breath sounds: Normal breath sounds. No stridor. No wheezing or rales.   Chest:     Chest wall: No tenderness.  Abdominal:     General: Bowel sounds are normal. There is no distension.     Palpations: Abdomen is soft. There is no mass.     Tenderness: There is no abdominal tenderness. There is no guarding or rebound.  Genitourinary:    Penis: Normal.      Testes: Normal. Cremasteric reflex is present.        Right: Mass not present.        Left: Mass not present.  Musculoskeletal:  General: No tenderness. Normal range of motion.     Cervical back: Normal range of motion and neck supple.  Lymphadenopathy:     Cervical: No cervical adenopathy.  Skin:    General: Skin is warm.     Coloration: Skin is not pale.     Findings: No erythema or rash.  Neurological:     Mental Status: He is alert and oriented to person, place, and time.     Cranial Nerves: No cranial nerve deficit.     Motor: No abnormal muscle tone.     Coordination: Coordination normal.     Deep Tendon Reflexes: Reflexes are normal and symmetric.  Psychiatric:        Behavior: Behavior normal.        Thought Content: Thought content normal.        Judgment: Judgment normal.    There are numerous keloids on his shoulders and upper back.       Assessment & Plan:  General medical exam  Screening cholesterol level  Prostate cancer screening PSA was normal.  Cholesterol is elevated but borderline.  We discussed obtaining a coronary artery calcium score to determine if the patient would benefit from taking statin given that his cholesterol is borderline.  He will discuss this with his wife.  He received his flu shot today.  He declines a shingles vaccine.  I recommended a COVID shot.  Colonoscopy is due again in 2027.

## 2023-09-28 NOTE — Addendum Note (Signed)
Addended by: Venia Carbon K on: 09/28/2023 10:12 AM   Modules accepted: Orders

## 2023-10-06 ENCOUNTER — Encounter: Payer: Self-pay | Admitting: Neurology

## 2023-10-10 ENCOUNTER — Telehealth: Payer: Self-pay

## 2023-10-10 NOTE — Telephone Encounter (Signed)
  Faxed to [facility]

## 2023-10-13 ENCOUNTER — Ambulatory Visit
Admission: RE | Admit: 2023-10-13 | Discharge: 2023-10-13 | Disposition: A | Discharge status: Home / Self Care | Payer: Medicare Other | Source: Ambulatory Visit | Attending: Neurology | Admitting: Neurology

## 2023-10-13 DIAGNOSIS — G35 Multiple sclerosis: Secondary | ICD-10-CM

## 2023-10-13 MED ORDER — GADOPICLENOL 0.5 MMOL/ML IV SOLN
9.0000 mL | Freq: Once | INTRAVENOUS | Status: AC | PRN
  Administered 2023-10-13: 9 mL via INTRAVENOUS

## 2023-11-15 ENCOUNTER — Other Ambulatory Visit: Payer: Self-pay

## 2023-11-15 MED ORDER — FLUTICASONE PROPIONATE 50 MCG/ACT NA SUSP: 2.0000 | Freq: Every day | NASAL | 3 refills | Status: AC

## 2023-11-15 NOTE — Telephone Encounter (Signed)
Prescription Request  11/15/2023  LOV: 10/08/23  What is the name of the medication or equipment? fluticasone (FLONASE) 50 MCG/ACT nasal spray [[id number]]   Have you contacted your pharmacy to request a refill? Yes   Which pharmacy would you like this sent to?  [id number] - [place],  - [address] AT [facility] [address] [place] [address] Phone: [phone] Fax: [phone]    Patient notified that their request is being sent to the clinical staff for review and that they should receive a response within 2 business days.   Please advise at [facility] [phone]

## 2023-11-15 NOTE — Telephone Encounter (Signed)
Requested Prescriptions  Pending Prescriptions Disp Refills   fluticasone (FLONASE) 50 MCG/ACT nasal spray 48 mL 3    Sig: Place 2 sprays into both nostrils daily. SPRAY 2 SPRAYS INTO EACH NOSTRIL EVERY DAY     Ear, Nose, and Throat: Nasal Preparations - Corticosteroids Failed - 11/15/2023  5:09 PM      Failed - Valid encounter within last 12 months    Recent Outpatient Visits           1 year ago General medical exam   [facility] Donita Brooks, MD   3 years ago Prostate cancer screening   [facility] Donita Brooks, MD   3 years ago Visit for suture removal   [facility] Donita Brooks, MD   3 years ago Mass of chest wall, right   [facility] Pickard, Priscille Heidelberg, MD   3 years ago Mass in chest   [facility] Pickard, Priscille Heidelberg, MD

## 2024-01-04 ENCOUNTER — Other Ambulatory Visit: Payer: Self-pay

## 2024-01-04 DIAGNOSIS — G35 Multiple sclerosis: Secondary | ICD-10-CM

## 2024-01-08 ENCOUNTER — Other Ambulatory Visit: Payer: Self-pay

## 2024-02-07 ENCOUNTER — Other Ambulatory Visit: Payer: Self-pay

## 2024-02-07 DIAGNOSIS — G35 Multiple sclerosis: Secondary | ICD-10-CM

## 2024-02-08 LAB — CBC WITH DIFFERENTIAL/PLATELET
Basophils Absolute: 0 10*3/uL (ref 0.0–0.2)
Basos: 1 %
EOS (ABSOLUTE): 0.3 10*3/uL (ref 0.0–0.4)
Eos: 4 %
Hematocrit: 41.1 % (ref 37.5–51.0)
Hemoglobin: 13.8 g/dL (ref 13.0–17.7)
Immature Grans (Abs): 0 10*3/uL (ref 0.0–0.1)
Immature Granulocytes: 1 %
Lymphocytes Absolute: 2.8 10*3/uL (ref 0.7–3.1)
Lymphs: 43 %
MCH: 28.3 pg (ref 26.6–33.0)
MCHC: 33.6 g/dL (ref 31.5–35.7)
MCV: 84 fL (ref 79–97)
Monocytes Absolute: 0.5 10*3/uL (ref 0.1–0.9)
Monocytes: 7 %
NRBC: 1 % — ABNORMAL HIGH (ref 0–0)
Neutrophils Absolute: 2.9 10*3/uL (ref 1.4–7.0)
Neutrophils: 44 %
Platelets: 162 10*3/uL (ref 150–450)
RBC: 4.87 x10E6/uL (ref 4.14–5.80)
RDW: 13.1 % (ref 11.6–15.4)
WBC: 6.4 10*3/uL (ref 3.4–10.8)

## 2024-02-12 ENCOUNTER — Encounter: Payer: Self-pay | Admitting: Neurology

## 2024-02-14 ENCOUNTER — Telehealth: Payer: Self-pay | Admitting: Neurology

## 2024-02-14 NOTE — Telephone Encounter (Signed)
 JCV collected 02/07/2024 0.24, negative.

## 2024-03-25 ENCOUNTER — Ambulatory Visit: Payer: Self-pay

## 2024-03-25 NOTE — Telephone Encounter (Signed)
   Chief Complaint: left leg swelling Symptoms: pain   Disposition: [] ED /[] Urgent Care (no appt availability in office) / [x] Appointment(In office/virtual)/ []  [facility]/ [] Home Care/ [] Refused Recommended Disposition /[] [facility]/ []  Follow-up with PCP Additional Notes: Pt calling after ED visit on 5/31. Pt had left leg swelling and pain. Swelling is in calf and ankle. Pt denies SOB, chest pain. Leg is cool to touch. Pt stated in spots the pain is 6/10. DVT was ruled out and was told to f/u with PCP. Pt has appt 6/6 @ 11 with PCP. RN gave care advice and pt verbalized understanding.            Copied from CRM [id number]. Topic: Clinical - Red Word Triage >> Mar 25, 2024  8:02 AM Tiffany B wrote: Red Word that prompted transfer to Nurse Triage: Left leg lower calf ankle area and swelling is still present and pain when walking, Reason for Disposition  [1] MILD swelling of both ankles (i.e., pedal edema) AND [2] new-onset or worsening  Answer Assessment - Initial Assessment Questions 1. ONSET: "When did the swelling start?" (e.g., minutes, hours, days)     4-5 days ago  2. LOCATION: "What part of the leg is swollen?"  "Are both legs swollen or just one leg?"     Calf and ankle  3. SEVERITY: "How bad is the swelling?" (e.g., localized; mild, moderate, severe)   - Localized: Small area of swelling localized to one leg.   - MILD pedal edema: Swelling limited to foot and ankle, pitting edema < 1/4 inch (6 mm) deep, rest and elevation eliminate most or all swelling.   - MODERATE edema: Swelling of lower leg to knee, pitting edema > 1/4 inch (6 mm) deep, rest and elevation only partially reduce swelling.   - SEVERE edema: Swelling extends above knee, facial or hand swelling present.      Moderate  4. REDNESS: "Does the swelling look red or infected?"     Denies  5. PAIN: "Is the swelling painful to touch?" If Yes, ask: "How painful is it?"   (Scale 1-10; mild,  moderate or severe)     6-7 6. FEVER: "Do you have a fever?" If Yes, ask: "What is it, how was it measured, and when did it start?"      Denies  7. CAUSE: "What do you think is causing the leg swelling?"     Not sure  8. MEDICAL HISTORY: "Do you have a history of blood clots (e.g., DVT), cancer, heart failure, kidney disease, or liver failure?"     Na  9. RECURRENT SYMPTOM: "Have you had leg swelling before?" If Yes, ask: "When was the last time?" "What happened that time?"    Denies   10. OTHER SYMPTOMS: "Do you have any other symptoms?" (e.g., chest pain, difficulty breathing)      When squatting, losses feeling in left leg  Protocols used: Leg Swelling and Edema-A-AH

## 2024-03-29 ENCOUNTER — Encounter: Payer: Self-pay | Admitting: Family Medicine

## 2024-03-29 ENCOUNTER — Ambulatory Visit: Admitting: Family Medicine

## 2024-03-29 VITALS — BP 124/62 | HR 84 | Temp 98.0°F | Ht 71.0 in | Wt 202.4 lb

## 2024-03-29 DIAGNOSIS — S86112A Strain of other muscle(s) and tendon(s) of posterior muscle group at lower leg level, left leg, initial encounter: Secondary | ICD-10-CM | POA: Diagnosis not present

## 2024-03-29 MED ORDER — PREDNISONE 20 MG PO TABS: ORAL_TABLET | ORAL | 0 refills | Status: DC

## 2024-03-29 NOTE — Progress Notes (Signed)
 Subjective:    Patient ID: Jonathon Vazquez, male    DOB: 04-26-1971, 53 y.o.   MRN: [id number]  HPI Over the last 3 weeks, the patient has had significant pain behind his left knee.  He also had swelling and pain in his left calf.  The pain is primarily in the left calf.  Due to the swelling in his left leg from his knee to his ankle, he went to an urgent care and was referred to the emergency room.  They performed an ultrasound to rule out a DVT.  This was negative.  However despite having the DVT ruled out, he had pain to the left trapezius.  Pain is exacerbated by Hildy Lowers testing.  Pain is also exacerbated by plantarflexion of the ankle.  However the pain is primarily in the superior body of the gastrocnemius and in the middle of the gastrocnemius.  There is no rash to suggest shingles.  There is no erythema or warmth.  There is no palpable aneurysm in the popliteal fossa.  Patient has no pain with knee flexion.  Past Medical History:  Diagnosis Date   Anal fistula 1996   Anal stenosis    Atrial fibrillation (HCC)    pt and wife deny this   Colon stricture (HCC)    descending colon   Crohn's disease (HCC)    Hx of colonic polyp 10/21/2016   MRSA (methicillin resistant Staphylococcus aureus)    Multiple sclerosis (HCC)    Neuromuscular disorder (HCC)    Patent ductus arteriosus    Regional enteritis of large intestine (HCC)    S/P left hemicolectomy 2015   [facility] for stricture   Stricture of descending colon due to Crohn's disease 02/13/2012   Past Surgical History:  Procedure Laterality Date   COLON SURGERY  2015   descending colon stricture    COLONOSCOPY     multiple   Current Outpatient Medications on File Prior to Visit  Medication Sig Dispense Refill   acetaminophen  (TYLENOL ) 325 MG tablet Take by mouth.     clonazePAM  (KLONOPIN ) 0.5 MG tablet Take 1 tablet (0.5 mg total) by mouth at bedtime as needed for anxiety. 30 tablet 1   fluticasone  (FLONASE ) 50 MCG/ACT nasal  spray Place 2 sprays into both nostrils daily. SPRAY 2 SPRAYS INTO EACH NOSTRIL EVERY DAY 48 mL 3   MULTIPLE VITAMIN PO Take by mouth.     natalizumab  (TYSABRI ) 300 MG/15ML injection Inject into the vein. 1 hour infusion monthly     No current facility-administered medications on file prior to visit.   Allergies  Allergen Reactions   Lialda  [Mesalamine ] Diarrhea    Severe diarrhea   Social History   Socioeconomic History   Marital status: Married    Spouse name: Youth worker   Number of children: 3   Years of education: 13   Highest education level: Associate degree: occupational, Scientist, product/process development, or vocational program  Occupational History   Occupation: disabled  Tobacco Use   Smoking status: Former    Current packs/day: 0.00    Average packs/day: 1 pack/day for 15.0 years (15.0 ttl pk-yrs)    Types: Cigarettes    Start date: 10/24/1989    Quit date: 10/24/2004    Years since quitting: 19.4   Smokeless tobacco: Never  Vaping Use   Vaping status: Never Used  Substance and Sexual Activity   Alcohol use: Never   Drug use: Never   Sexual activity: Yes  Other Topics Concern   Not on  file  Social History Narrative   Married, 1 son one daughter. Wife is Charity fundraiser. Neurosurgeon)   Disabled, previously worked at [employer], disability due to multiple sclerosis   Caffeine one daily   Left handed.   Education- one year of college.   Social Drivers of Corporate investment banker Strain: Low Risk  (03/28/2024)   Overall Financial Resource Strain (CARDIA)    Difficulty of Paying Living Expenses: Not very hard  Food Insecurity: No Food Insecurity (03/28/2024)   Hunger Vital Sign    Worried About Running Out of Food in the Last Year: Never true    Ran Out of Food in the Last Year: Never true  Transportation Needs: No Transportation Needs (03/28/2024)   PRAPARE - Administrator, Civil Service (Medical): No    Lack of Transportation (Non-Medical): No  Physical Activity: Inactive (03/28/2024)    Exercise Vital Sign    Days of Exercise per Week: 0 days    Minutes of Exercise per Session: 30 min  Stress: No Stress Concern Present (03/28/2024)   [employer] of Occupational Health - Occupational Stress Questionnaire    Feeling of Stress : Only a little  Social Connections: Socially Integrated (03/28/2024)   Social Connection and Isolation Panel [NHANES]    Frequency of Communication with Friends and Family: More than three times a week    Frequency of Social Gatherings with Friends and Family: Once a week    Attends Religious Services: More than 4 times per year    Active Member of [employer] or Organizations: Yes    Attends Engineer, structural: More than 4 times per year    Marital Status: Married  Catering manager Violence: Not At Risk (05/04/2023)   Humiliation, Afraid, Rape, and Kick questionnaire    Fear of Current or Ex-Partner: No    Emotionally Abused: No    Physically Abused: No    Sexually Abused: No   Family History  Problem Relation Age of Onset   Heart disease Mother    Ulcerative colitis Father    Diabetes Paternal Grandmother    Prostate cancer Paternal Grandfather    Colon cancer Neg Hx    Rectal cancer Neg Hx    Stomach cancer Neg Hx    Colon polyps Neg Hx    Esophageal cancer Neg Hx      Review of Systems  All other systems reviewed and are negative.      Objective:   Physical Exam Vitals reviewed.  Constitutional:      General: He is not in acute distress.    Appearance: He is well-developed. He is not diaphoretic.  HENT:     Head: Normocephalic and atraumatic.  Cardiovascular:     Rate and Rhythm: Normal rate and regular rhythm.     Heart sounds: Normal heart sounds. No murmur heard.    No friction rub. No gallop.  Pulmonary:     Effort: Pulmonary effort is normal. No respiratory distress.     Breath sounds: Normal breath sounds. No stridor. No wheezing or rales.  Chest:     Chest wall: No tenderness.  Genitourinary:    Penis:  Normal.      Testes: Normal. Cremasteric reflex is present.        Right: Mass not present.        Left: Mass not present.  Musculoskeletal:        General: Tenderness present.     Right lower leg: No edema.  Left lower leg: Tenderness present. No bony tenderness. No edema.     Left ankle:     Left Achilles Tendon: Tenderness present. No defects. Thompson's test negative.       Legs:  Neurological:     Mental Status: He is alert.     Motor: No abnormal muscle tone.     Deep Tendon Reflexes: Reflexes are normal and symmetric.       Assessment & Plan:   Strain of gastrocnemius muscle of left lower extremity, initial encounter - Plan: Ambulatory referral to Physical Therapy I believe the patient has strained the superior insertion of the gastrocnemius muscle and has a partial tear in the gastrocnemius muscle.  I believe the swelling within the muscle body could be causing compression on the vein triggering swelling in the left leg similar to what a Baker's cyst can cause.  There is no palpable Baker's cyst on exam today.  Recommended a trial of prednisone  as an anti-inflammatory and a referral to physical therapy.  If pain persist, consider an MRI of the left lower leg for further evaluation

## 2024-04-04 ENCOUNTER — Ambulatory Visit: Admitting: Family Medicine

## 2024-04-08 ENCOUNTER — Ambulatory Visit: Admitting: Neurology

## 2024-04-08 ENCOUNTER — Encounter: Payer: Self-pay | Admitting: Neurology

## 2024-04-08 VITALS — BP 118/68 | Ht 71.0 in | Wt 205.0 lb

## 2024-04-08 DIAGNOSIS — G4700 Insomnia, unspecified: Secondary | ICD-10-CM

## 2024-04-08 DIAGNOSIS — G35 Multiple sclerosis: Secondary | ICD-10-CM

## 2024-04-08 NOTE — Progress Notes (Signed)
ASSESSMENT AND PLAN 53 y.o. year old male   1.  Relapsing remitting multiple sclerosis 2.  Crohn's disease, no exacerbations 3.  Chronic insomnia -Overall doing well, no worsening symptoms, dealing with some knee pain issues  -On Tysabri  since December 2016, will continue this -JCV virus antibody April 2025 0.24 negative, reminder to recheck in October 2025 -MRI of the brain was stable in December 2024, no new lesions compared to November 2023 -Continue clonazepam  0.5 mg as needed at bedtime -Follow-up in 6 months or sooner if needed with Dr. Gracie Lav to rotate every few visits with me  DIAGNOSTIC DATA (LABS, IMAGING, TESTING) - I reviewed patient records, labs, notes, testing and imaging myself where available.   Anti-JC virus antibody was negative with a titer of 0.25 in October 2023  MRI of the brain with and without contrast in Nov 2023: Multiple T2/FLAIR hyperintense foci in the brainstem, cerebellum, left thalamus and cerebral hemispheres in a pattern consistent with chronic demyelinating plaque associated with multiple sclerosis.  None of the foci enhanced or appear to be acute.  Compared to the MRI from 03/24/2021, there were no new lesions. No acute findings.  Normal enhancement pattern.  MRI of cervical spine w/wo in Nov 2023  Chronic demyelinating lesions involving the cervical cord at C2 and C4-5, not significantly changed or progressed as compared to previous MRI from 2015. No evidence for active demyelination. 2. Left paracentral disc protrusions at C5-6 and C6-7 with resultant mild cord flattening, but no significant spinal stenosis.   HISTORY:  He was seen by his [facility] since 2000, initially presenting with facial numbness, MRI demonstrates lesions consistent with MS, however he has lost followups, was suggested Copaxone in 2004, but never followed through, he was reevaluated by Dr. Wilmer Hash in February of 2008, he began to have bladder incontinence, dizziness, blurry vision,  unbalanced gait, repeat MRI at that time continued to demonstrate multiple foci of signal hyperintensity, involving brainstem, superior peduncle, deep white matters, there was pericallosal involvement, no enhancement, but there was positive DWIs lesion, he was started on Avenox since 06/13/2007,  he continued to have flulike illness following injection,   He also reported a history of Crohn's disease, last treatment was in 2004 with [facility], he no longer has significant GI symptoms, He was treated with immunosuppressive treatment mercaptopurine in late 1990s, for 3-4 months, still had diahrrea.   He had one to 2 flareups each year,  presenting with increased dizziness, unbalanced gait, blurry vision, worsening memory trouble, often partial improvement with steroid package.   Most recent MRI cervical was in July 2011, remote demyelinating plaque at C2 and C4, no enhancing lesions.   Repeat MRI brain and cervical in Jan 2013 showed a defined hyperintensity in the spinal cord at C2 which likely represents remote age inactive demyelinating plaque. No enhancing lesions are noted. As compared with previous MRI dated 04/30/2010 the C4 spinal cord hyperintensity is no longer seen.   MRI scan of the brain showed multiple brainstem, cerebellar, corpus callosal and periventricular white matter hyperintensities consistent with multiple sclerosis. No enhancing lesions are noted. Presence of T1 black holes  andd corpus callosal atrophy indicate chronic disease. Overall no significant changes compared with MRI from 01/23/2007    He is doing very well, there was no new neurological symptoms, he is taking Avonex , mild flulike illness right out of the injection, improved by Tylenol , or aleve.  He complains of frequent bilateral frontal headaches.   We have reviewed MRI of  the brain, which has demonstrated multiple periventricular and subcortical and infratentorial chronic demyelinating plaques. No acute plaques. Compared to  prior MRI on 01/23/07, there are has been disease progression.   He continues to do very well, reported prior to MRI scan, he has been off of his Avonex  for 4 months, due to insurance reasons, after discussed with Mr. Meulemans we decided to stay on current Avonex  treatment, overall only slight change in his MRI scan there is no significant clinical worsening, we will repeat MRI of the brain and cervical spine with and without contrast in one year.   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 Willy Harvest, he has been treated with prednisone , mercaptopurine in 2011 150mg  daily for one year, ( which is a nucleoside metabolic inhibitor that results in cell-cycle arrest and death) and mesalamine  in the past,   We decided not to proceed with Catarina Cline,  Keep him on Avonex  he complains of 3 weeks history of left arm numbness, since January 2015, has much improved now,   Update September 15, 2021: Patient return for electrodiagnostic study today, showed no evidence of left upper extremity neuropathy, the tenderness is more at the left radial styloid, happened after excessive use of left hand,   We again reviewed multiple MRI of the brain, cervical spine in the past, reviewed his MS history, had worsening neurological symptoms, from 20 14-20 16, correlate with worsening Crohn's disease, there was also evidence of mild progression by MRI standard  His symptoms overall has much improved with Tysabri  treatment since December 2016, he tolerated infusion every 4 weeks well, JC virus titer remains at the low side, 0.25 in October 2022  Patient's and wife quoted recent biogen study for extending Tysabri  infusion to every  6 weeks, to avoid long-term infusion side effect, especially the worrisome PML, despite the fact that he is at the lower risk group,  We went over published data from Lancet neurology in April 2022 https://www.thelancet.com/journals/laneur/article/[email or web address]/fulltext   We found a numerical difference in the mean number of new or newly enlarging T2 hyperintense lesions at week 72 between the once every 6 weeks and once every 4 weeks groups, which reached significance under the secondary estimand, but interpretation of statistical differences (or absence thereof) is limited because disease activity in the once every 4 weeks group was lower than expected. The safety profiles of natalizumab  once every 6 weeks and once every 4 weeks were similar. Although this trial was not powered to assess differences in risk of progressive multifocal leukoencephalopathy, the occurrence of the (asymptomatic) case underscores the importance of monitoring and risk factor consideration in all patients receiving natalizumab .  UPDATE Feb 28 2023: He is accompanied by his wife at today's clinical visit, remained on Tysabri  every 4 weeks, doing very well, Crohn's disease is in remission as well, no new MS flareup  He has mild fatigue, some improvement with workout  JC virus titer 0.25, negative on August 17, 2022  We personally reviewed and compared MRI of the brain August 25, 2022, multiple T2/FLAIR hyperintensity signal in the brainstem, cerebellum, left thalamus, cerebral hemisphere, no change compared to previous MRIs  MRI of cervical spine showed chronic demyelinating lesions involving cervical cord at C2,  C4-5, no significant change compared to previous scan, no evidence of contrast-enhancement  Update September 14, 2023 SS: remains on Tysabri , tolerating well. Feels MS is stable. Sometimes problems with balance. Dr. Gracie Lav has given Klonopin  as needed, last fill was march 2023, would like refill for  insomnia. [facility], educational. Getting physical soon.  JCV October 2024 0.24 indeterminate, inhibition assay negative. CBC normal.  Update April 08, 2024 SS: JCV April 2025 0.24 negative. MRI brain December 2024 stable MS lesions, no new lesions compared to November 2023. When he walks his knees gives out. Has not fallen. Saw PCP, given prednisone  for left leg pain, PT. Remains on Tysabri , feels MS stable.   REVIEW OF SYSTEMS: Out of a complete 14 system review of symptoms, the patient complains only of the following symptoms, and all other reviewed systems are negative.  See HPI  PHYSICAL EXAM  Vitals:   02/28/23 1504  BP: 128/78  Weight: 201 lb (91.2 kg)  Height: 5' 11 (1.803 m)   Body mass index is 28.03 kg/m.  PHYSICAL EXAMNIATION:  General: The patient is alert and cooperative at the time of the examination.  Skin: No significant peripheral edema is noted.  Neurologic Exam  Mental status: The patient is alert and oriented x 3 at the time of the examination. The patient has apparent normal recent and remote memory, with an apparently normal attention span and concentration ability.  Cranial nerves: Facial symmetry is present. Speech is normal, no aphasia or dysarthria is noted. Extraocular movements are full. Visual fields are full.  Motor: The patient has good strength in all 4 extremities, slightly decreased to right upper extremity  Sensory examination: Soft touch sensation is symmetric on the face, arms, and legs.  Coordination: The patient has good finger-nose-finger and heel-to-shin bilaterally.  Gait and station: The patient has a normal gait. Tandem gait is slightly unsteady.  Reflexes: Deep tendon reflexes are symmetric.  ALLERGIES: Allergies  Allergen Reactions   Lialda  [Mesalamine ] Diarrhea    Severe diarrhea    HOME MEDICATIONS: Outpatient Medications Prior to Visit  Medication Sig Dispense Refill   acetaminophen  (TYLENOL ) 325 MG tablet  Take by mouth.     clonazePAM  (KLONOPIN ) 0.5 MG tablet Take 1 tablet (0.5 mg total) by mouth at bedtime as needed for anxiety. 30 tablet 1   fluticasone  (FLONASE ) 50 MCG/ACT nasal spray Place 2 sprays into both nostrils daily. SPRAY 2 SPRAYS INTO EACH NOSTRIL EVERY DAY 48 mL 3   MULTIPLE VITAMIN PO Take by mouth.     natalizumab  (TYSABRI ) 300 MG/15ML injection Inject into the vein. 1 hour infusion monthly     predniSONE  (DELTASONE ) 20 MG tablet 3 tabs poqday 1-2, 2 tabs poqday 3-4, 1 tab poqday 5-6 12 tablet 0   No facility-administered medications prior to visit.    PAST MEDICAL HISTORY: Past Medical History:  Diagnosis Date   Anal fistula 1996   Anal stenosis    Atrial fibrillation (HCC)    pt and wife deny this   Colon stricture (HCC)    descending colon   Crohn's disease (HCC)    Hx of colonic polyp 10/21/2016   MRSA (methicillin resistant Staphylococcus aureus)    Multiple sclerosis (HCC)    Neuromuscular disorder (HCC)    Patent ductus arteriosus    Regional enteritis of large intestine (HCC)    S/P left hemicolectomy 2015   [facility] for stricture   Stricture of descending colon due to Crohn's disease 02/13/2012  PAST SURGICAL HISTORY: Past Surgical History:  Procedure Laterality Date   COLON SURGERY  2015   descending colon stricture    COLONOSCOPY     multiple    FAMILY HISTORY: Family History  Problem Relation Age of Onset   Heart disease Mother    Ulcerative colitis Father    Diabetes Paternal Grandmother    Prostate cancer Paternal Grandfather    Colon cancer Neg Hx    Rectal cancer Neg Hx    Stomach cancer Neg Hx    Colon polyps Neg Hx    Esophageal cancer Neg Hx     SOCIAL HISTORY: Social History   Socioeconomic History   Marital status: Married    Spouse name: Youth worker   Number of children: 3   Years of education: 13   Highest education level: Associate degree: occupational, Scientist, product/process development, or vocational program  Occupational History    Occupation: disabled  Tobacco Use   Smoking status: Former    Current packs/day: 0.00    Average packs/day: 1 pack/day for 15.0 years (15.0 ttl pk-yrs)    Types: Cigarettes    Start date: 10/24/1989    Quit date: 10/24/2004    Years since quitting: 19.4   Smokeless tobacco: Never  Vaping Use   Vaping status: Never Used  Substance and Sexual Activity   Alcohol use: Never   Drug use: Never   Sexual activity: Yes  Other Topics Concern   Not on file  Social History Narrative   Married, 1 son one daughter. Wife is Charity fundraiser. Neurosurgeon)   Disabled, previously worked at [employer], disability due to multiple sclerosis   Caffeine one daily   Left handed.   Education- one year of college.   Social Drivers of Corporate investment banker Strain: Low Risk  (03/28/2024)   Overall Financial Resource Strain (CARDIA)    Difficulty of Paying Living Expenses: Not very hard  Food Insecurity: No Food Insecurity (03/28/2024)   Hunger Vital Sign    Worried About Running Out of Food in the Last Year: Never true    Ran Out of Food in the Last Year: Never true  Transportation Needs: No Transportation Needs (03/28/2024)   PRAPARE - Administrator, Civil Service (Medical): No    Lack of Transportation (Non-Medical): No  Physical Activity: Inactive (03/28/2024)   Exercise Vital Sign    Days of Exercise per Week: 0 days    Minutes of Exercise per Session: 30 min  Stress: No Stress Concern Present (03/28/2024)   [employer] of Occupational Health - Occupational Stress Questionnaire    Feeling of Stress : Only a little  Social Connections: Socially Integrated (03/28/2024)   Social Connection and Isolation Panel    Frequency of Communication with Friends and Family: More than three times a week    Frequency of Social Gatherings with Friends and Family: Once a week    Attends Religious Services: More than 4 times per year    Active Member of [employer] or Organizations: Yes    Attends Banker  Meetings: More than 4 times per year    Marital Status: Married  Catering manager Violence: Not At Risk (05/04/2023)   Humiliation, Afraid, Rape, and Kick questionnaire    Fear of Current or Ex-Partner: No    Emotionally Abused: No    Physically Abused: No    Sexually Abused: No   Jeanmarie Millet, Maritza Sidles, DNP  [facility] [address] [place], [place] [address] [phone]  

## 2024-04-08 NOTE — Patient Instructions (Signed)
 Great to see you today.  We will continue Tysabri .  Plan to recheck labs around October.  Please call for any issues.  Follow-up in 6 months.  Thanks!!

## 2024-04-16 ENCOUNTER — Ambulatory Visit: Payer: Medicare Other | Admitting: Neurology

## 2024-04-17 ENCOUNTER — Telehealth: Payer: Self-pay

## 2024-04-17 NOTE — Telephone Encounter (Signed)
 SABRA

## 2024-04-17 NOTE — Telephone Encounter (Signed)
 Tysabri  patient status report & reauthorization questionnaire faxed to the touch prescribing program

## 2024-04-18 ENCOUNTER — Telehealth: Payer: Self-pay

## 2024-04-18 ENCOUNTER — Encounter: Payer: Self-pay | Admitting: Family Medicine

## 2024-04-22 ENCOUNTER — Other Ambulatory Visit: Payer: Self-pay

## 2024-04-23 ENCOUNTER — Encounter: Payer: Self-pay | Admitting: Rehabilitative and Restorative Service Providers"

## 2024-04-23 ENCOUNTER — Ambulatory Visit: Admitting: Rehabilitative and Restorative Service Providers"

## 2024-04-23 ENCOUNTER — Other Ambulatory Visit: Payer: Self-pay | Admitting: Family Medicine

## 2024-04-23 DIAGNOSIS — M79605 Pain in left leg: Secondary | ICD-10-CM

## 2024-04-23 DIAGNOSIS — R262 Difficulty in walking, not elsewhere classified: Secondary | ICD-10-CM

## 2024-04-23 DIAGNOSIS — M6281 Muscle weakness (generalized): Secondary | ICD-10-CM

## 2024-04-23 DIAGNOSIS — M25562 Pain in left knee: Secondary | ICD-10-CM

## 2024-04-23 DIAGNOSIS — G8929 Other chronic pain: Secondary | ICD-10-CM

## 2024-04-23 DIAGNOSIS — M25511 Pain in right shoulder: Secondary | ICD-10-CM

## 2024-04-23 DIAGNOSIS — S838X2S Sprain of other specified parts of left knee, sequela: Secondary | ICD-10-CM

## 2024-04-23 NOTE — Therapy (Signed)
 OUTPATIENT PHYSICAL THERAPY  EVALUATION   Patient Name: Jonathon Vazquez MRN: [id number] DOB:June 24, 1971, 53 y.o., male Today's Date: 04/23/2024  END OF SESSION:  PT End of Session - 04/23/24 1444     Visit Number 1    Number of Visits 20    Date for PT Re-Evaluation 07/02/24    Authorization Type BCBS    Progress Note Due on Visit 10    PT Start Time 1440    PT Stop Time 1516    PT Time Calculation (min) 36 min    Activity Tolerance Patient tolerated treatment well    Behavior During Therapy WFL for tasks assessed/performed          Past Medical History:  Diagnosis Date   Anal fistula 1996   Anal stenosis    Atrial fibrillation (HCC)    pt and wife deny this   Colon stricture (HCC)    descending colon   Crohn's disease (HCC)    Hx of colonic polyp 10/21/2016   MRSA (methicillin resistant Staphylococcus aureus)    Multiple sclerosis (HCC)    Neuromuscular disorder (HCC)    Patent ductus arteriosus    Regional enteritis of large intestine (HCC)    S/P left hemicolectomy 2015   [facility] for stricture   Stricture of descending colon due to Crohn's disease 02/13/2012   Past Surgical History:  Procedure Laterality Date   COLON SURGERY  2015   descending colon stricture    COLONOSCOPY     multiple   Patient Active Problem List   Diagnosis Date Noted   Left wrist pain 09/15/2021   Relapsing remitting multiple sclerosis (HCC) 09/09/2021   High risk medication use 09/09/2021   Crohn's disease of small intestine without complication (HCC) 08/22/2019   Cellulitis 06/24/2018   Patent ductus arteriosus    MRSA (methicillin resistant Staphylococcus aureus)    Crohn's disease (HCC)    Therapeutic drug monitoring 06/13/2017   Encounter for therapeutic drug monitoring 02/09/2017   Visual changes 12/07/2016   Insomnia 12/07/2016   Hx of colonic polyp 10/21/2016   PDA (patent ductus arteriosus) 03/01/2012   Multiple sclerosis (HCC) 12/11/2008   Crohn's colitis- hx  strictures and resection 12/11/2008    PCP: Duanne Butler DASEN, MD  REFERRING PROVIDER: Duanne Butler DASEN, MD  REFERRING DIAG: S86.112A (ICD-10-CM) - Strain of gastrocnemius muscle of left lower extremity, initial encounter  THERAPY DIAG:  Acute pain of left knee  Pain in left leg  Muscle weakness (generalized)  Difficulty in walking, not elsewhere classified  Chronic right shoulder pain  Rationale for Evaluation and Treatment: Rehabilitation  ONSET DATE: may/June 2025  SUBJECTIVE:   SUBJECTIVE STATEMENT: Pt indicated onset of symptoms about a month or so ago.  Pt indicated he started exercising and noted swelling in lower leg/ankle.  Reported going to urgent care for symptoms and was given prednisone .  Pt indicated referral for physical therapy but medicine did help the swelling.  Pt indicated a week later the knee was swollen.  Wore compression socks and elevating that did help.  Pt indicated during the day feeling worse with activity including walking, bending/squatting, stairs. Pt indicated complaints currently in knee/lower leg.   Pt indicated workouts were sit ups/pull ups.   Reported having some complaints in Rt shoulder and would like to work on that.   MRI possible in future.   PERTINENT HISTORY: Seen by clinic in 2024 for shoulder pain  PAIN:  NPRS scale: at worst 8/10 Pain location: knee/lower  leg - current knee Pain description: sharp, numbness at times.  Aggravating factors: standing, walking, stairs, bending.  Relieving factors: nothing specific other than taking weight off.   PRECAUTIONS: None  WEIGHT BEARING RESTRICTIONS: No  FALLS:  Has patient fallen in last 6 months? No  LIVING ENVIRONMENT: Lives in: House/apartment Stairs to entry 3 with no hand rails  OCCUPATION: Disability   PLOF: Independent, Lt hand dominant, going to softball games for family member. Goes to gym 2x/week typically for whole body exercise. Limited due to Rt arm symptoms.    PATIENT GOALS: Reduce pain   OBJECTIVE:    PATIENT SURVEYS:  Patient-Specific Activity Scoring Scheme  0 represents "unable to perform." 10 represents "able to perform at prior level. 0 1 2 3 4 5 6 7 8 9  10 (Date and Score)   Activity Eval  04/23/2024    1. Walking  4    2. Bending/squatting  3    3. Stairs 3   4.    5.    Score 3.667    Total score = sum of the activity scores/number of activities Minimum detectable change (90%CI) for average score = 2 points Minimum detectable change (90%CI) for single activity score = 3 points  COGNITION: 04/23/2024 Overall cognitive status: WFL    SENSATION: 04/23/2024 WFL  EDEMA:  04/23/2024 Joint line  16.5 inch Lt 15.5 inch Rt   MUSCLE LENGTH: 04/23/2024   POSTURE:  04/23/2024 Unremarkable   PALPATION: 04/23/2024 Tenderness posterior knee, distal medial hamstring tendons, Anterior inferior patellar ligament.   LOWER EXTREMITY ROM:   ROM Right Eval 04/23/2024 Left Eval 04/23/2024  Hip flexion    Hip extension    Hip abduction    Hip adduction    Hip internal rotation    Hip external rotation    Knee flexion    Knee extension  -19 AROM seated LAQ  0 in heel prop  Ankle dorsiflexion    Ankle plantarflexion    Ankle inversion    Ankle eversion     (Blank rows = not tested)  LOWER EXTREMITY MMT:  MMT Right Eval 04/23/2024 Left Eval 04/23/2024  Hip flexion 5/5 5/5  Hip extension    Hip abduction    Hip adduction    Hip internal rotation    Hip external rotation    Knee flexion 5/5 4/5 With pain anterior knee  Knee extension 5/5 4/5 With pain anterior knee  Ankle dorsiflexion 5/5 5/5  Ankle plantarflexion    Ankle inversion 5/5 5/5  Ankle eversion 5/5 5/5   (Blank rows = not tested)  LOWER EXTREMITY SPECIAL TESTS:  04/23/2024 No specific testing  FUNCTIONAL TESTS:  04/23/2024 18 inch chair transfer: pain in back and front of Lt knee. Able to perform on 1st try Lt SLS: unable unassisted.  Pain  noted  Rt SLS: < 5 seconds  GAIT: 04/23/2024 Independent ambulation.  TODAY'S TREATMENT                                                                          DATE: 04/23/2024 Therex:    HEP instruction/performance c cues for techniques, handout provided.  Trial set performed of each for comprehension and symptom assessment.  See below for exercise list  Vaso 10 mins Lt knee in elevation medium compression 34 degrees.   PATIENT EDUCATION:  04/23/2024 Education details: HEP, POC Person educated: Patient Education method: Programmer, multimedia, Demonstration, Verbal cues, and Handouts Education comprehension: verbalized understanding, returned demonstration, and verbal cues required  HOME EXERCISE PROGRAM: Access Code: [email or web address] URL: [email or web address] Date: 04/23/2024 Prepared by: Ozell Silvan  Exercises - Supine Quadricep Sets  - 2-3 x daily - 7 x weekly - 1 sets - 10 reps - 5 hold - Seated Quad Set (Mirrored)  - 2-3 x daily - 7 x weekly - 1 sets - 10 reps - 5 hold - Seated Long Arc Quad (Mirrored)  - 3-5 x daily - 7 x weekly - 1-2 sets - 10 reps - 2 hold - Seated Gastroc Stretch with Strap (Mirrored)  - 2-3 x daily - 7 x weekly - 1 sets - 5 reps - 30 hold Supine or seated isometric hamstring slr press 5 to 10 sec hold x 10 1-2x/day  ASSESSMENT:  CLINICAL IMPRESSION: Patient is a 53 y.o. who comes to clinic with complaints of Lt knee, lower leg pain c insidious onset with mobility, strength and movement coordination deficits that impair their ability to perform usual daily and recreational functional activities without increase difficulty/symptoms at this time.  Patient to benefit from skilled PT services to address impairments and limitations to improve to previous level of function without restriction  secondary to condition.   OBJECTIVE IMPAIRMENTS: Abnormal gait, decreased activity tolerance, decreased balance, decreased coordination, decreased endurance, decreased mobility, difficulty walking, decreased ROM, decreased strength, hypomobility, increased edema, increased fascial restrictions, impaired perceived functional ability, increased muscle spasms, impaired flexibility, improper body mechanics, and pain.   ACTIVITY LIMITATIONS: carrying, lifting, bending, sitting, standing, squatting, sleeping, stairs, transfers, bed mobility, and locomotion level  PARTICIPATION LIMITATIONS: meal prep, cleaning, laundry, interpersonal relationship, driving, shopping, and community activity  PERSONAL FACTORS: multiple body parts, Crohn's Disease, MS (see chart list) are also affecting patient's functional outcome.   REHAB POTENTIAL: Good  CLINICAL DECISION MAKING: Evolving/moderate complexity  EVALUATION COMPLEXITY: Moderate   GOALS: Goals reviewed with patient? Yes  SHORT TERM GOALS: (target date for Short term goals are 3 weeks 05/14/2024)   1.  Patient will demonstrate independent use of home exercise program to maintain progress from in clinic treatments.  Goal status: New  LONG TERM GOALS: (target dates for all long term goals are 10 weeks  07/02/2024 )   1. Patient will demonstrate/report pain at worst less than or equal to 2/10 to facilitate minimal limitation in daily activity secondary to pain symptoms.  Goal status: New   2. Patient will demonstrate independent use of home exercise program to facilitate ability to maintain/progress functional gains from skilled physical therapy services.  Goal status: New   3. Patient will demonstrate Patient specific functional scale avg > or = 8/10 to indicate reduced disability due  to condition.   Goal status: New   4.  Patient will demonstrate bilateral LE MMT 5/5 throughout to faciltiate usual transfers, stairs, squatting at [facility] for daily  life.   Goal status: New   5.  Patient will demonstrate Lt knee AROM full range s symptoms to facilitate usual mobility at PLOF.  Goal status: New   6.  Patient will demonstrate ascending/descending stairs reciprocally s UE assist for community integration.   Goal status: New   7.  Patient will demonstrate bilateral SLS > 10 seconds for stability in ambulation.  Goal Status: New   PLAN:  PT FREQUENCY: 1-2x/week  PT DURATION: 10 weeks  PLANNED INTERVENTIONS: Can include 02853- PT Re-evaluation, 97110-Therapeutic exercises, 97530- Therapeutic activity, [id number]- Neuromuscular re-education, 97535- Self Care, 97140- Manual therapy, [phone]- Gait training, [phone]- Orthotic Fit/training, [phone]- Canalith repositioning, [id number]- Aquatic Therapy, [phone]- Electrical stimulation (unattended), [id number] Physical performance testing, 97016- Vasopneumatic device, [id number]- Ultrasound, [id number]- Traction (mechanical), [id number]- Ionotophoresis 4mg /ml Dexamethasone,  20560 - Needle insertion w/o injection 1 or 2 muscles, 20561 - Needle insertion w/o injection 3 or more muscles.   Patient/Family education, Balance training, Stair training, Taping, Dry Needling, Joint mobilization, Joint manipulation, Spinal manipulation, Spinal mobilization, Scar mobilization, Vestibular training, Visual/preceptual remediation/compensation, DME instructions, Cryotherapy, and Moist heat.  All performed as medically necessary.  All included unless contraindicated  PLAN FOR NEXT SESSION: Review HEP knowledge/results.  Progressive strengthening for Lt leg as tolerated.  Vaso as necessary for edema.    Ozell Silvan, PT, DPT, OCS, ATC 04/23/24  4:09 PM

## 2024-04-29 ENCOUNTER — Ambulatory Visit: Admitting: Rehabilitative and Restorative Service Providers"

## 2024-04-29 ENCOUNTER — Encounter: Payer: Self-pay | Admitting: Rehabilitative and Restorative Service Providers"

## 2024-04-29 DIAGNOSIS — M25511 Pain in right shoulder: Secondary | ICD-10-CM

## 2024-04-29 DIAGNOSIS — M25562 Pain in left knee: Secondary | ICD-10-CM

## 2024-04-29 DIAGNOSIS — M79605 Pain in left leg: Secondary | ICD-10-CM

## 2024-04-29 DIAGNOSIS — M6281 Muscle weakness (generalized): Secondary | ICD-10-CM | POA: Diagnosis not present

## 2024-04-29 DIAGNOSIS — G8929 Other chronic pain: Secondary | ICD-10-CM

## 2024-04-29 DIAGNOSIS — R262 Difficulty in walking, not elsewhere classified: Secondary | ICD-10-CM | POA: Diagnosis not present

## 2024-04-29 NOTE — Therapy (Signed)
 OUTPATIENT PHYSICAL THERAPY  TREATMENT   Patient Name: Jonathon Vazquez MRN: [id number] DOB:Mar 14, 1971, 53 y.o., male Today's Date: 04/29/2024  END OF SESSION:  PT End of Session - 04/29/24 1559     Visit Number 2    Number of Visits 20    Date for PT Re-Evaluation 07/02/24    Authorization Type BCBS    Progress Note Due on Visit 10    PT Start Time 1552    PT Stop Time 1633    PT Time Calculation (min) 41 min    Activity Tolerance Patient limited by pain    Behavior During Therapy [facility] for tasks assessed/performed           Past Medical History:  Diagnosis Date   Anal fistula 1996   Anal stenosis    Atrial fibrillation (HCC)    pt and wife deny this   Colon stricture (HCC)    descending colon   Crohn's disease (HCC)    Hx of colonic polyp 10/21/2016   MRSA (methicillin resistant Staphylococcus aureus)    Multiple sclerosis (HCC)    Neuromuscular disorder (HCC)    Patent ductus arteriosus    Regional enteritis of large intestine (HCC)    S/P left hemicolectomy 2015   [facility] for stricture   Stricture of descending colon due to Crohn's disease 02/13/2012   Past Surgical History:  Procedure Laterality Date   COLON SURGERY  2015   descending colon stricture    COLONOSCOPY     multiple   Patient Active Problem List   Diagnosis Date Noted   Left wrist pain 09/15/2021   Relapsing remitting multiple sclerosis (HCC) 09/09/2021   High risk medication use 09/09/2021   Crohn's disease of small intestine without complication (HCC) 08/22/2019   Cellulitis 06/24/2018   Patent ductus arteriosus    MRSA (methicillin resistant Staphylococcus aureus)    Crohn's disease (HCC)    Therapeutic drug monitoring 06/13/2017   Encounter for therapeutic drug monitoring 02/09/2017   Visual changes 12/07/2016   Insomnia 12/07/2016   Hx of colonic polyp 10/21/2016   PDA (patent ductus arteriosus) 03/01/2012   Multiple sclerosis (HCC) 12/11/2008   Crohn's colitis- hx strictures and  resection 12/11/2008    PCP: Duanne Butler DASEN, MD  REFERRING PROVIDER: Duanne Butler DASEN, MD  REFERRING DIAG: S86.112A (ICD-10-CM) - Strain of gastrocnemius muscle of left lower extremity, initial encounter  THERAPY DIAG:  Acute pain of left knee  Pain in left leg  Muscle weakness (generalized)  Difficulty in walking, not elsewhere classified  Chronic right shoulder pain  Rationale for Evaluation and Treatment: Rehabilitation  ONSET DATE: may/June 2025  SUBJECTIVE:   SUBJECTIVE STATEMENT: Pt indicated feeling a little better, noted reduced complaints in walking.  Pt indicated squatting, kneeling is most chief complaint of pain.   PERTINENT HISTORY: Seen by clinic in 2024 for shoulder pain  PAIN:  NPRS scale: 4/10 today Pain location: knee/lower leg - current knee Pain description: sharp, numbness at times.  Aggravating factors: standing, walking, stairs, bending.  Relieving factors: nothing specific other than taking weight off.   PRECAUTIONS: None  WEIGHT BEARING RESTRICTIONS: No  FALLS:  Has patient fallen in last 6 months? No  LIVING ENVIRONMENT: Lives in: House/apartment Stairs to entry 3 with no hand rails  OCCUPATION: Disability   PLOF: Independent, Lt hand dominant, going to softball games for family member. Goes to gym 2x/week typically for whole body exercise. Limited due to Rt arm symptoms.   PATIENT GOALS:  Reduce pain   OBJECTIVE:    PATIENT SURVEYS:  Patient-Specific Activity Scoring Scheme  0 represents "unable to perform." 10 represents "able to perform at prior level. 0 1 2 3 4 5 6 7 8 9  10 (Date and Score)   Activity Eval  04/23/2024    1. Walking  4    2. Bending/squatting  3    3. Stairs 3   4.    5.    Score 3.667    Total score = sum of the activity scores/number of activities Minimum detectable change (90%CI) for average score = 2 points Minimum detectable change (90%CI) for single activity score = 3  points  COGNITION: 04/23/2024 Overall cognitive status: WFL    SENSATION: 04/23/2024 WFL  EDEMA:  04/23/2024 Joint line  16.5 inch Lt 15.5 inch Rt   MUSCLE LENGTH: 04/23/2024 No specific testing  POSTURE:  04/23/2024 Unremarkable   PALPATION: 04/29/2024: Trigger points in Rt infraspinatus noted.   04/23/2024 Tenderness posterior knee, distal medial hamstring tendons, Anterior inferior patellar ligament.   LOWER EXTREMITY ROM:   ROM Right Eval 04/23/2024 Left Eval 04/23/2024 Left 04/29/2024  Hip flexion     Hip extension     Hip abduction     Hip adduction     Hip internal rotation     Hip external rotation     Knee flexion   120 AROM in supine heel slide  Knee extension  -19 AROM seated LAQ  0 in heel prop -9 AROM in seated LAQ  Ankle dorsiflexion     Ankle plantarflexion     Ankle inversion     Ankle eversion      (Blank rows = not tested)  LOWER EXTREMITY MMT:  MMT Right Eval 04/23/2024 Left Eval 04/23/2024  Hip flexion 5/5 5/5  Hip extension    Hip abduction    Hip adduction    Hip internal rotation    Hip external rotation    Knee flexion 5/5 4/5 With pain anterior knee  Knee extension 5/5 4/5 With pain anterior knee  Ankle dorsiflexion 5/5 5/5  Ankle plantarflexion    Ankle inversion 5/5 5/5  Ankle eversion 5/5 5/5   (Blank rows = not tested)  UPPER EXTREMITY MMT:  MMT Right eval Left eval  Shoulder flexion 5/5 5/5  Shoulder extension    Shoulder abduction 4/5 c pain 5/5  Shoulder adduction    Shoulder extension    Shoulder internal rotation 5/5 5/5  Shoulder external rotation 4/5 5/5  Middle trapezius    Lower trapezius    Elbow flexion    Elbow extension    Wrist flexion    Wrist extension    Wrist ulnar deviation    Wrist radial deviation    Wrist pronation    Wrist supination    Grip strength     (Blank rows = not tested)   LOWER EXTREMITY SPECIAL TESTS:  04/23/2024 No specific testing  FUNCTIONAL TESTS:  04/23/2024 18 inch  chair transfer: pain in back and front of Lt knee. Able to perform on 1st try Lt SLS: unable unassisted.  Pain noted  Rt SLS: < 5 seconds  GAIT: 04/23/2024 Independent ambulation.  TODAY'S TREATMENT                                                                          DATE: 04/29/2024 Therex: Nustep Lvl 5 8 mins UE/LE for knee ROM. (Mild symptoms noted in increased flexion mobility Lt knee) Supine quad set 5 sec hold x 10 bilaterally Seated  Rt arm cross over stretch 15 sec x 3 Instruction for sidelying Rt shoulder ER with 1-2 lbs in HEP.  Supine Lt knee heel slide 5 sec hold x 10 to tolerance.  Seated Lt quad set with SLR x 5 (stopped due to pain)  Leg press double leg 75 lbs x 15 in available knee range without pain, single leg Lt 37 lbs x 15  Manual Compression c movement active for infraspinatus with ER in sidelying.  Percussive device to Rt infraspinatus.   TODAY'S TREATMENT                                                                          DATE: 04/23/2024 Therex:    HEP instruction/performance c cues for techniques, handout provided.  Trial set performed of each for comprehension and symptom assessment.  See below for exercise list  Vaso 10 mins Lt knee in elevation medium compression 34 degrees.   PATIENT EDUCATION:  04/23/2024 Education details: HEP, POC Person educated: Patient Education method: Programmer, multimedia, Demonstration, Verbal cues, and Handouts Education comprehension: verbalized understanding, returned demonstration, and verbal cues required  HOME EXERCISE PROGRAM: Access Code: [email or web address] URL: [email or web address] Date: 04/29/2024 Prepared by: Ozell Silvan  Exercises - Supine Quadricep Sets  - 2-3 x daily - 7 x weekly - 1 sets - 10 reps - 5 hold - Seated Quad Set (Mirrored)  -  2-3 x daily - 7 x weekly - 1 sets - 10 reps - 5 hold - Seated Long Arc Quad (Mirrored)  - 3-5 x daily - 7 x weekly - 1-2 sets - 10 reps - 2 hold - Seated Gastroc Stretch with Strap (Mirrored)  - 2-3 x daily - 7 x weekly - 1 sets - 5 reps - 30 hold - Standing Shoulder Posterior Capsule Stretch (Mirrored)  - 2 x daily - 7 x weekly - 1 sets - 5 reps - 15-30 hold - Sidelying Shoulder ER with Towel and Dumbbell (Mirrored)  - 1-2 x daily - 7 x weekly - 2-3 sets - 10-20 reps  ASSESSMENT:  CLINICAL IMPRESSION: Treated Rt shoulder trigger points in manual today to try to improve those symptoms.  Mild strength deficits noted in Rt shoulder today compared to Lt.  Knee flexion continued to show pain complaints.  Introduced lower weight leg press activity painfree to help improve leg strength.  Continued skilled PT services   OBJECTIVE IMPAIRMENTS: Abnormal gait, decreased activity tolerance, decreased balance, decreased coordination, decreased endurance, decreased mobility, difficulty walking, decreased ROM, decreased strength, hypomobility, increased edema, increased fascial restrictions, impaired perceived functional ability, increased muscle spasms, impaired flexibility,  improper body mechanics, and pain.   ACTIVITY LIMITATIONS: carrying, lifting, bending, sitting, standing, squatting, sleeping, stairs, transfers, bed mobility, and locomotion level  PARTICIPATION LIMITATIONS: meal prep, cleaning, laundry, interpersonal relationship, driving, shopping, and community activity  PERSONAL FACTORS: multiple body parts, Crohn's Disease, MS (see chart list) are also affecting patient's functional outcome.   REHAB POTENTIAL: Good  CLINICAL DECISION MAKING: Evolving/moderate complexity  EVALUATION COMPLEXITY: Moderate   GOALS: Goals reviewed with patient? Yes  SHORT TERM GOALS: (target date for Short term goals are 3 weeks 05/14/2024)   1.  Patient will demonstrate independent use of home exercise program  to maintain progress from in clinic treatments.  Goal status: New  LONG TERM GOALS: (target dates for all long term goals are 10 weeks  07/02/2024 )   1. Patient will demonstrate/report pain at worst less than or equal to 2/10 to facilitate minimal limitation in daily activity secondary to pain symptoms.  Goal status: New   2. Patient will demonstrate independent use of home exercise program to facilitate ability to maintain/progress functional gains from skilled physical therapy services.  Goal status: New   3. Patient will demonstrate Patient specific functional scale avg > or = 8/10 to indicate reduced disability due to condition.   Goal status: New   4.  Patient will demonstrate bilateral LE MMT 5/5 throughout to faciltiate usual transfers, stairs, squatting at [facility] for daily life.   Goal status: New   5.  Patient will demonstrate Lt knee AROM full range s symptoms to facilitate usual mobility at PLOF.  Goal status: New   6.  Patient will demonstrate ascending/descending stairs reciprocally s UE assist for community integration.   Goal status: New   7.  Patient will demonstrate bilateral SLS > 10 seconds for stability in ambulation.  Goal Status: New   PLAN:  PT FREQUENCY: 1-2x/week  PT DURATION: 10 weeks  PLANNED INTERVENTIONS: Can include 02853- PT Re-evaluation, 97110-Therapeutic exercises, 97530- Therapeutic activity, [id number]- Neuromuscular re-education, 97535- Self Care, 97140- Manual therapy, [phone]- Gait training, [phone]- Orthotic Fit/training, [phone]- Canalith repositioning, [id number]- Aquatic Therapy, [phone]- Electrical stimulation (unattended), [id number] Physical performance testing, 97016- Vasopneumatic device, [id number]- Ultrasound, [id number]- Traction (mechanical), [id number]- Ionotophoresis 4mg /ml Dexamethasone,  20560 - Needle insertion w/o injection 1 or 2 muscles, 20561 - Needle insertion w/o injection 3 or more muscles.   Patient/Family education, Balance training, Stair training,  Taping, Dry Needling, Joint mobilization, Joint manipulation, Spinal manipulation, Spinal mobilization, Scar mobilization, Vestibular training, Visual/preceptual remediation/compensation, DME instructions, Cryotherapy, and Moist heat.  All performed as medically necessary.  All included unless contraindicated  PLAN FOR NEXT SESSION: Strengthening as able.  Manual for Rt shoulder trigger points.    Ozell Silvan, PT, DPT, OCS, ATC 04/29/24  4:35 PM

## 2024-05-03 ENCOUNTER — Ambulatory Visit (HOSPITAL_COMMUNITY)
Admission: RE | Admit: 2024-05-03 | Discharge: 2024-05-03 | Disposition: A | Discharge status: Home / Self Care | Source: Ambulatory Visit | Attending: Family Medicine | Admitting: Family Medicine

## 2024-05-03 DIAGNOSIS — S838X2S Sprain of other specified parts of left knee, sequela: Secondary | ICD-10-CM | POA: Diagnosis present

## 2024-05-03 DIAGNOSIS — M7122 Synovial cyst of popliteal space [Baker], left knee: Secondary | ICD-10-CM | POA: Diagnosis not present

## 2024-05-03 DIAGNOSIS — M1712 Unilateral primary osteoarthritis, left knee: Secondary | ICD-10-CM | POA: Diagnosis not present

## 2024-05-03 DIAGNOSIS — X58XXXS Exposure to other specified factors, sequela: Secondary | ICD-10-CM | POA: Insufficient documentation

## 2024-05-03 DIAGNOSIS — M25462 Effusion, left knee: Secondary | ICD-10-CM | POA: Insufficient documentation

## 2024-05-03 DIAGNOSIS — M94262 Chondromalacia, left knee: Secondary | ICD-10-CM | POA: Diagnosis not present

## 2024-05-06 ENCOUNTER — Other Ambulatory Visit: Payer: Self-pay

## 2024-05-06 ENCOUNTER — Ambulatory Visit: Payer: Self-pay | Admitting: Family Medicine

## 2024-05-06 DIAGNOSIS — S838X2S Sprain of other specified parts of left knee, sequela: Secondary | ICD-10-CM

## 2024-05-06 NOTE — Progress Notes (Signed)
 Referral for Urgent evaluation by orthopedics entered for [facility]. 05/06/2024

## 2024-05-08 ENCOUNTER — Ambulatory Visit (HOSPITAL_BASED_OUTPATIENT_CLINIC_OR_DEPARTMENT_OTHER): Admitting: Student

## 2024-05-08 ENCOUNTER — Encounter (HOSPITAL_BASED_OUTPATIENT_CLINIC_OR_DEPARTMENT_OTHER): Payer: Self-pay | Admitting: Student

## 2024-05-08 ENCOUNTER — Ambulatory Visit (HOSPITAL_BASED_OUTPATIENT_CLINIC_OR_DEPARTMENT_OTHER)

## 2024-05-08 DIAGNOSIS — M25562 Pain in left knee: Secondary | ICD-10-CM | POA: Diagnosis not present

## 2024-05-08 DIAGNOSIS — M25462 Effusion, left knee: Secondary | ICD-10-CM | POA: Diagnosis not present

## 2024-05-08 MED ORDER — LIDOCAINE HCL 1 % IJ SOLN
4.0000 mL | INTRAMUSCULAR | Status: AC | PRN
Start: 2024-05-08 — End: 2024-05-08
  Administered 2024-05-08: 4 mL

## 2024-05-08 MED ORDER — TRIAMCINOLONE ACETONIDE 40 MG/ML IJ SUSP
2.0000 mL | INTRAMUSCULAR | Status: AC | PRN
Start: 2024-05-08 — End: 2024-05-08
  Administered 2024-05-08: 2 mL via INTRA_ARTICULAR

## 2024-05-08 NOTE — Progress Notes (Signed)
 Chief Complaint: Left knee pain    Discussed the use of AI scribe software for clinical note transcription with the patient, who gave verbal consent to proceed.  History of Present Illness Jonathon Vazquez is a 53 year old male who presents with knee swelling and pain.  He has experienced persistent swelling and pain in the knee for two months, primarily on the medial side. Pain is dull, becoming sharp when rising from the floor, and worsens with repeated bending and standing. Initially, numbness occurred when squatting, resolving after standing. Ankle and calf swelling occurred, treated with prednisone , reducing swelling but not knee symptoms. A second prednisone  course was ineffective. Home management includes a knee brace, ice packs, and elevation, with no change in swelling. He has difficulty bending the knee, with dull pain becoming sharp with certain movements. An MRI was recently performed on 7/11. He has no history of gout or similar joint issues. He is attending physical therapy for the knee and shoulder.   Surgical History:   None  PMH/PSH/Family History/Social History/Meds/Allergies:    Past Medical History:  Diagnosis Date   Anal fistula 1996   Anal stenosis    Atrial fibrillation (HCC)    pt and wife deny this   Colon stricture (HCC)    descending colon   Crohn's disease (HCC)    Hx of colonic polyp 10/21/2016   MRSA (methicillin resistant Staphylococcus aureus)    Multiple sclerosis (HCC)    Neuromuscular disorder (HCC)    Patent ductus arteriosus    Regional enteritis of large intestine (HCC)    S/P left hemicolectomy 2015   [facility]-done for stricture   Stricture of descending colon due to Crohn's disease 02/13/2012   Past Surgical History:  Procedure Laterality Date   COLON SURGERY  2015   descending colon stricture    COLONOSCOPY     multiple   Social History   Socioeconomic History   Marital status: Married    Spouse name:  Youth worker   Number of children: 3   Years of education: 13   Highest education level: Associate degree: occupational, Scientist, product/process development, or vocational program  Occupational History   Occupation: disabled  Tobacco Use   Smoking status: Former    Current packs/day: 0.00    Average packs/day: 1 pack/day for 15.0 years (15.0 ttl pk-yrs)    Types: Cigarettes    Start date: 10/24/1989    Quit date: 10/24/2004    Years since quitting: 19.5   Smokeless tobacco: Never  Vaping Use   Vaping status: Never Used  Substance and Sexual Activity   Alcohol use: Never   Drug use: Never   Sexual activity: Yes  Other Topics Concern   Not on file  Social History Narrative   Married, 1 son one daughter. Wife is Charity fundraiser. Neurosurgeon)   Disabled, previously worked at [employer], disability due to multiple sclerosis   Caffeine one daily   Left handed.   Education- one year of college.   Social Drivers of Corporate investment banker Strain: Low Risk  (03/28/2024)   Overall Financial Resource Strain (CARDIA)    Difficulty of Paying Living Expenses: Not very hard  Food Insecurity: No Food Insecurity (03/28/2024)   Hunger Vital Sign    Worried About Running Out of Food in the Last Year: Never true  Ran Out of Food in the Last Year: Never true  Transportation Needs: No Transportation Needs (03/28/2024)   PRAPARE - Administrator, Civil Service (Medical): No    Lack of Transportation (Non-Medical): No  Physical Activity: Inactive (03/28/2024)   Exercise Vital Sign    Days of Exercise per Week: 0 days    Minutes of Exercise per Session: 30 min  Stress: No Stress Concern Present (03/28/2024)   [employer] of Occupational Health - Occupational Stress Questionnaire    Feeling of Stress : Only a little  Social Connections: Socially Integrated (03/28/2024)   Social Connection and Isolation Panel    Frequency of Communication with Friends and Family: More than three times a week    Frequency of Social Gatherings  with Friends and Family: Once a week    Attends Religious Services: More than 4 times per year    Active Member of [employer] or Organizations: Yes    Attends Engineer, structural: More than 4 times per year    Marital Status: Married   Family History  Problem Relation Age of Onset   Heart disease Mother    Ulcerative colitis Father    Diabetes Paternal Grandmother    Prostate cancer Paternal Grandfather    Colon cancer Neg Hx    Rectal cancer Neg Hx    Stomach cancer Neg Hx    Colon polyps Neg Hx    Esophageal cancer Neg Hx    Allergies  Allergen Reactions   Lialda  [Mesalamine ] Diarrhea    Severe diarrhea   Current Outpatient Medications  Medication Sig Dispense Refill   acetaminophen  (TYLENOL ) 325 MG tablet Take by mouth.     clonazePAM  (KLONOPIN ) 0.5 MG tablet Take 1 tablet (0.5 mg total) by mouth at bedtime as needed for anxiety. 30 tablet 1   fluticasone  (FLONASE ) 50 MCG/ACT nasal spray Place 2 sprays into both nostrils daily. SPRAY 2 SPRAYS INTO EACH NOSTRIL EVERY DAY 48 mL 3   MULTIPLE VITAMIN PO Take by mouth.     natalizumab  (TYSABRI ) 300 MG/15ML injection Inject into the vein. 1 hour infusion monthly     predniSONE  (DELTASONE ) 20 MG tablet 3 tabs poqday 1-2, 2 tabs poqday 3-4, 1 tab poqday 5-6 12 tablet 0   No current facility-administered medications for this visit.   No results found.  Review of Systems:   A ROS was performed including pertinent positives and negatives as documented in the HPI.  Physical Exam :   Constitutional: NAD and appears stated age Neurological: Alert and oriented Psych: Appropriate affect and cooperative There were no vitals taken for this visit.   Comprehensive Musculoskeletal Exam:    Exam of the left knee demonstrates active range of motion from 5 to 90 degrees.  Moderate effusion present without overlying erythema or warmth.  No medial or lateral joint line tenderness.  Baker's cyst palpable.  Stable collaterals with varus  and valgus stress.  Imaging:   Xray (left knee 4 views): Negative for acute bony abnormality.  Mild to moderate degenerative changes with medial joint space narrowing.  Effusion present.   I personally reviewed and interpreted the radiographs.      Assessment & Plan Acute left knee pain and swelling Patient is experiencing 3-month history of left knee pain and swelling and is unsure of an injury mechanism.  On exam today he does have a notable effusion.  Recent MRI demonstrates tricompartmental cartilage loss most notable in the medial and patellofemoral compartments as  well as the trochlea.  MRI also suggests pathology within the MCL, although this does appear to be intact and feels stable on exam.  I believe the majority of his symptoms are more related to the degenerative changes which could be responsible for the effusion.  A knee aspiration and cortisone injection was offered and performed today after patient consent obtained.  Will plan to send off synovial fluid for analysis.  Can return to clinic as needed and continue physical therapy as tolerated.      Procedure Note  Patient: Jonathon Vazquez             Date of Birth: 1971/04/04           MRN: [id number]             Visit Date: 05/08/2024  Procedures: Visit Diagnoses:  1. Acute pain of left knee     Large Joint Inj: L knee on 05/08/2024 9:52 AM Indications: pain Details: 18 G 1.5 in needle, superolateral approach Medications: 4 mL lidocaine  1 %; 2 mL triamcinolone  acetonide 40 MG/ML Aspirate: 20 mL clear and yellow; sent for lab analysis Outcome: tolerated well, no immediate complications Procedure, treatment alternatives, risks and benefits explained, specific risks discussed. Consent was given by the patient. Immediately prior to procedure a time out was called to verify the correct patient, procedure, equipment, support staff and site/side marked as required. Patient was prepped and draped in the usual sterile fashion.        I personally saw and evaluated the patient, and participated in the management and treatment plan.  Leonce Reveal, PA-C Orthopedics

## 2024-05-09 ENCOUNTER — Ambulatory Visit: Payer: Medicare Other | Admitting: *Deleted

## 2024-05-09 VITALS — Ht 71.0 in | Wt 205.0 lb

## 2024-05-09 DIAGNOSIS — Z0000 Encounter for general adult medical examination without abnormal findings: Secondary | ICD-10-CM

## 2024-05-09 NOTE — Patient Instructions (Signed)
 Mr. Jonathon Vazquez , Thank you for taking time to come for your Medicare Wellness Visit. I appreciate your ongoing commitment to your health goals. Please review the following plan we discussed and let me know if I can assist you in the future.   Screening recommendations/referrals:  Recommended yearly ophthalmology/optometry visit for glaucoma screening and checkup Recommended yearly dental visit for hygiene and checkup  Vaccinations: Influenza vaccine: up to date Tdap vaccine: up to date Shingles vaccine: Education provided   Preventive Care 40-64 Years, Male Preventive care refers to lifestyle choices and visits with your health care provider that can promote health and wellness. What does preventive care include? A yearly physical exam. This is also called an annual well check. Dental exams once or twice a year. Routine eye exams. Ask your health care provider how often you should have your eyes checked. Personal lifestyle choices, including: Daily care of your teeth and gums. Regular physical activity. Eating a healthy diet. Avoiding tobacco and drug use. Limiting alcohol use. Practicing safe sex. Taking low-dose aspirin every day starting at age 75. What happens during an annual well check? The services and screenings done by your health care provider during your annual well check will depend on your age, overall health, lifestyle risk factors, and family history of disease. Counseling  Your health care provider may ask you questions about your: Alcohol use. Tobacco use. Drug use. Emotional well-being. Home and relationship well-being. Sexual activity. Eating habits. Work and work Astronomer. Screening  You may have the following tests or measurements: Height, weight, and BMI. Blood pressure. Lipid and cholesterol levels. These may be checked every 5 years, or more frequently if you are over 61 years old. Skin check. Lung cancer screening. You may have this screening every  year starting at age 98 if you have a 30-pack-year history of smoking and currently smoke or have quit within the past 15 years. Fecal occult blood test (FOBT) of the stool. You may have this test every year starting at age 67. Flexible sigmoidoscopy or colonoscopy. You may have a sigmoidoscopy every 5 years or a colonoscopy every 10 years starting at age 60. Prostate cancer screening. Recommendations will vary depending on your family history and other risks. Hepatitis C blood test. Hepatitis B blood test. Sexually transmitted disease (STD) testing. Diabetes screening. This is done by checking your blood sugar (glucose) after you have not eaten for a while (fasting). You may have this done every 1-3 years. Discuss your test results, treatment options, and if necessary, the need for more tests with your health care provider. Vaccines  Your health care provider may recommend certain vaccines, such as: Influenza vaccine. This is recommended every year. Tetanus, diphtheria, and acellular pertussis (Tdap, Td) vaccine. You may need a Td booster every 10 years. Zoster vaccine. You may need this after age 80. Pneumococcal 13-valent conjugate (PCV13) vaccine. You may need this if you have certain conditions and have not been vaccinated. Pneumococcal polysaccharide (PPSV23) vaccine. You may need one or two doses if you smoke cigarettes or if you have certain conditions. Talk to your health care provider about which screenings and vaccines you need and how often you need them. This information is not intended to replace advice given to you by your health care provider. Make sure you discuss any questions you have with your health care provider. Document Released: 11/06/2015 Document Revised: 06/29/2016 Document Reviewed: 08/11/2015 Elsevier Interactive Patient Education  2017 [employer].  Fall Prevention in the Home Falls can  cause injuries. They can happen to people of all ages. There are many things  you can do to make your home safe and to help prevent falls. What can I do on the outside of my home? Regularly fix the edges of walkways and driveways and fix any cracks. Remove anything that might make you trip as you walk through a door, such as a raised step or threshold. Trim any bushes or trees on the path to your home. Use bright outdoor lighting. Clear any walking paths of anything that might make someone trip, such as rocks or tools. Regularly check to see if handrails are loose or broken. Make sure that both sides of any steps have handrails. Any raised decks and porches should have guardrails on the edges. Have any leaves, snow, or ice cleared regularly. Use sand or salt on walking paths during winter. Clean up any spills in your garage right away. This includes oil or grease spills. What can I do in the bathroom? Use night lights. Install grab bars by the toilet and in the tub and shower. Do not use towel bars as grab bars. Use non-skid mats or decals in the tub or shower. If you need to sit down in the shower, use a plastic, non-slip stool. Keep the floor dry. Clean up any water that spills on the floor as soon as it happens. Remove soap buildup in the tub or shower regularly. Attach bath mats securely with double-sided non-slip rug tape. Do not have throw rugs and other things on the floor that can make you trip. What can I do in the bedroom? Use night lights. Make sure that you have a light by your bed that is easy to reach. Do not use any sheets or blankets that are too big for your bed. They should not hang down onto the floor. Have a firm chair that has side arms. You can use this for support while you get dressed. Do not have throw rugs and other things on the floor that can make you trip. What can I do in the kitchen? Clean up any spills right away. Avoid walking on wet floors. Keep items that you use a lot in easy-to-reach places. If you need to reach something  above you, use a strong step stool that has a grab bar. Keep electrical cords out of the way. Do not use floor polish or wax that makes floors slippery. If you must use wax, use non-skid floor wax. Do not have throw rugs and other things on the floor that can make you trip. What can I do with my stairs? Do not leave any items on the stairs. Make sure that there are handrails on both sides of the stairs and use them. Fix handrails that are broken or loose. Make sure that handrails are as long as the stairways. Check any carpeting to make sure that it is firmly attached to the stairs. Fix any carpet that is loose or worn. Avoid having throw rugs at the top or bottom of the stairs. If you do have throw rugs, attach them to the floor with carpet tape. Make sure that you have a light switch at the top of the stairs and the bottom of the stairs. If you do not have them, ask someone to add them for you. What else can I do to help prevent falls? Wear shoes that: Do not have high heels. Have rubber bottoms. Are comfortable and fit you well. Are closed at the toe.  Do not wear sandals. If you use a stepladder: Make sure that it is fully opened. Do not climb a closed stepladder. Make sure that both sides of the stepladder are locked into place. Ask someone to hold it for you, if possible. Clearly mark and make sure that you can see: Any grab bars or handrails. First and last steps. Where the edge of each step is. Use tools that help you move around (mobility aids) if they are needed. These include: Canes. Walkers. Scooters. Crutches. Turn on the lights when you go into a dark area. Replace any light bulbs as soon as they burn out. Set up your furniture so you have a clear path. Avoid moving your furniture around. If any of your floors are uneven, fix them. If there are any pets around you, be aware of where they are. Review your medicines with your doctor. Some medicines can make you feel dizzy.  This can increase your chance of falling. Ask your doctor what other things that you can do to help prevent falls. This information is not intended to replace advice given to you by your health care provider. Make sure you discuss any questions you have with your health care provider. Document Released: 08/06/2009 Document Revised: 03/17/2016 Document Reviewed: 11/14/2014 Elsevier Interactive Patient Education  2017 [employer].

## 2024-05-09 NOTE — Progress Notes (Signed)
 Subjective:   Jonathon Vazquez is a 53 y.o. male who presents for Medicare Annual/Subsequent preventive examination.  Visit Complete: Virtual I connected with  Jonathon Vazquez on 05/09/24 by a audio enabled telemedicine application and verified that I am speaking with the correct person using two identifiers.  Patient Location: Home  Provider Location: Home Office  I discussed the limitations of evaluation and management by telemedicine. The patient expressed understanding and agreed to proceed.  Vital Signs: Because this visit was a virtual/telehealth visit, some criteria may be missing or patient reported. Any vitals not documented were not able to be obtained and vitals that have been documented are patient reported.   Cardiac Risk Factors include: advanced age (>64men, >55 women);male gender;obesity (BMI >30kg/m2)     Objective:    Today's Vitals   05/09/24 1142  Weight: 205 lb (93 kg)  Height: 5' 11 (1.803 m)  PainSc: 2    Body mass index is 28.59 kg/m.     05/09/2024   11:45 AM 04/23/2024    2:43 PM 05/04/2023   11:08 AM 11/28/2022    2:34 PM 04/21/2022   10:47 AM 06/24/2018    1:47 AM 09/24/2015    3:55 PM  Advanced Directives  Does Patient Have a Medical Advance Directive? No No No No No No  No   Does patient want to make changes to medical advance directive?      No - Patient declined    Would patient like information on creating a medical advance directive?  No - Patient declined Yes (MAU/Ambulatory/Procedural Areas - Information given) No - Patient declined No - Patient declined No - Patient declined       Data saved with a previous flowsheet row definition    Current Medications (verified) Outpatient Encounter Medications as of 05/09/2024  Medication Sig   acetaminophen  (TYLENOL ) 325 MG tablet Take by mouth.   clonazePAM  (KLONOPIN ) 0.5 MG tablet Take 1 tablet (0.5 mg total) by mouth at bedtime as needed for anxiety.   fluticasone  (FLONASE ) 50 MCG/ACT nasal spray  Place 2 sprays into both nostrils daily. SPRAY 2 SPRAYS INTO EACH NOSTRIL EVERY DAY   MULTIPLE VITAMIN PO Take by mouth.   natalizumab  (TYSABRI ) 300 MG/15ML injection Inject into the vein. 1 hour infusion monthly   predniSONE  (DELTASONE ) 20 MG tablet 3 tabs poqday 1-2, 2 tabs poqday 3-4, 1 tab poqday 5-6   No facility-administered encounter medications on file as of 05/09/2024.    Allergies (verified) Lialda  [mesalamine ]   History: Past Medical History:  Diagnosis Date   Anal fistula 1996   Anal stenosis    Atrial fibrillation (HCC)    pt and wife deny this   Colon stricture (HCC)    descending colon   Crohn's disease (HCC)    Hx of colonic polyp 10/21/2016   MRSA (methicillin resistant Staphylococcus aureus)    Multiple sclerosis (HCC)    Neuromuscular disorder (HCC)    Patent ductus arteriosus    Regional enteritis of large intestine (HCC)    S/P left hemicolectomy 2015   [facility]-done for stricture   Stricture of descending colon due to Crohn's disease 02/13/2012   Past Surgical History:  Procedure Laterality Date   COLON SURGERY  2015   descending colon stricture    COLONOSCOPY     multiple   Family History  Problem Relation Age of Onset   Heart disease Mother    Ulcerative colitis Father    Diabetes Paternal Grandmother  Prostate cancer Paternal Grandfather    Colon cancer Neg Hx    Rectal cancer Neg Hx    Stomach cancer Neg Hx    Colon polyps Neg Hx    Esophageal cancer Neg Hx    Social History   Socioeconomic History   Marital status: Married    Spouse name: Youth worker   Number of children: 3   Years of education: 13   Highest education level: Associate degree: occupational, Scientist, product/process development, or vocational program  Occupational History   Occupation: disabled  Tobacco Use   Smoking status: Former    Current packs/day: 0.00    Average packs/day: 1 pack/day for 15.0 years (15.0 ttl pk-yrs)    Types: Cigarettes    Start date: 10/24/1989    Quit date: 10/24/2004     Years since quitting: 19.5   Smokeless tobacco: Never  Vaping Use   Vaping status: Never Used  Substance and Sexual Activity   Alcohol use: Never   Drug use: Never   Sexual activity: Yes  Other Topics Concern   Not on file  Social History Narrative   Married, 1 son one daughter. Wife is Charity fundraiser. Neurosurgeon)   Disabled, previously worked at [employer], disability due to multiple sclerosis   Caffeine one daily   Left handed.   Education- one year of college.   Social Drivers of Corporate investment banker Strain: Low Risk  (05/09/2024)   Overall Financial Resource Strain (CARDIA)    Difficulty of Paying Living Expenses: Not hard at all  Food Insecurity: No Food Insecurity (05/09/2024)   Hunger Vital Sign    Worried About Running Out of Food in the Last Year: Never true    Ran Out of Food in the Last Year: Never true  Transportation Needs: No Transportation Needs (05/09/2024)   PRAPARE - Administrator, Civil Service (Medical): No    Lack of Transportation (Non-Medical): No  Physical Activity: Inactive (05/09/2024)   Exercise Vital Sign    Days of Exercise per Week: 0 days    Minutes of Exercise per Session: 0 min  Stress: No Stress Concern Present (05/09/2024)   [employer] of Occupational Health - Occupational Stress Questionnaire    Feeling of Stress: Not at all  Social Connections: Socially Integrated (05/09/2024)   Social Connection and Isolation Panel    Frequency of Communication with Friends and Family: More than three times a week    Frequency of Social Gatherings with Friends and Family: More than three times a week    Attends Religious Services: More than 4 times per year    Active Member of [employer] or Organizations: Yes    Attends Engineer, structural: More than 4 times per year    Marital Status: Married    Tobacco Counseling Counseling given: Not Answered   Clinical Intake:  Pre-visit preparation completed: Yes  Pain : 0-10 Pain Score: 2   Pain Type: Acute pain Pain Location: Knee Pain Orientation: Left Pain Descriptors / Indicators: Aching, Dull     Diabetes: No  How often do you need to have someone help you when you read instructions, pamphlets, or other written materials from your doctor or pharmacy?: 1 - Never  Interpreter Needed?: No  Information entered by :: Mliss Graff LPN   Activities of Daily Living    05/09/2024   11:47 AM  In your present state of health, do you have any difficulty performing the following activities:  Hearing? 0  Vision? 0  Difficulty concentrating or making decisions? 0  Walking or climbing stairs? 0  Dressing or bathing? 0  Doing errands, shopping? 0  Preparing Food and eating ? N  Using the Toilet? N  In the past six months, have you accidently leaked urine? N  Do you have problems with loss of bowel control? N  Managing your Medications? N  Managing your Finances? N  Housekeeping or managing your Housekeeping? N    Patient Care Team: Duanne Butler DASEN, MD as PCP - General (Family Medicine) Avram Lupita BRAVO, MD as Consulting Physician (Gastroenterology) Onita Duos, MD as Consulting Physician (Neurology)  Indicate any recent Medical Services you may have received from other than [facility] providers in the past year (date may be approximate).     Assessment:   This is a routine wellness examination for Dillian.  Hearing/Vision screen Hearing Screening - Comments:: No trouble hearing Vision Screening - Comments:: Up to date bowen   Goals Addressed             This Visit's Progress    Weight (lb) < 200 lb (90.7 kg)   205 lb (93 kg)      Depression Screen    05/09/2024   11:48 AM 09/28/2023   10:11 AM 05/04/2023   11:07 AM 11/07/2022    8:03 AM 04/21/2022   10:46 AM 05/22/2020    2:53 PM 05/28/2018    2:54 PM  PHQ 2/9 Scores  PHQ - 2 Score 0 0 0 0 0 0 0  PHQ- 9 Score 1     0     Fall Risk    05/09/2024   11:43 AM 09/28/2023   10:11 AM 05/04/2023   11:08 AM  11/07/2022    8:03 AM 04/21/2022   10:48 AM  Fall Risk   Falls in the past year? 0 0 0 0 0  Number falls in past yr: 0 0 0 0 0  Injury with Fall? 0 0 0 0 0  Risk for fall due to :  No Fall Risks No Fall Risks No Fall Risks No Fall Risks  Follow up Falls evaluation completed;Education provided;Falls prevention discussed Falls prevention discussed Falls prevention discussed;Education provided;Falls evaluation completed Falls prevention discussed  Falls prevention discussed      Data saved with a previous flowsheet row definition    MEDICARE RISK AT HOME: Medicare Risk at Home Any stairs in or around the home?: No If so, are there any without handrails?: No Home free of loose throw rugs in walkways, pet beds, electrical cords, etc?: Yes Adequate lighting in your home to reduce risk of falls?: Yes Life alert?: No Use of a cane, walker or w/c?: No Grab bars in the bathroom?: No Shower chair or bench in shower?: No Elevated toilet seat or a handicapped toilet?: No  TIMED UP AND GO:  Was the test performed?  No    Cognitive Function:        05/09/2024   11:45 AM 05/04/2023   11:08 AM 04/21/2022   10:49 AM  6CIT Screen  What Year? 0 points 0 points 0 points  What month? 0 points 0 points 0 points  What time? 0 points 0 points 0 points  Count back from 20 0 points 0 points 0 points  Months in reverse 0 points 0 points 0 points  Repeat phrase 0 points 0 points 0 points  Total Score 0 points 0 points 0 points    Immunizations Immunization History  Administered Date(s)  Administered   H1N1 10/30/2008   Hepatitis A 08/30/2007, 05/05/2015   IPV 08/30/2007   Influenza, Seasonal, Injecte, Preservative Fre 09/28/2023   Influenza,inj,Quad PF,6+ Mos 09/28/2015, 08/15/2016, 09/22/2017, 09/04/2018, 08/28/2019   Moderna Sars-Covid-2 Vaccination 01/12/2020, 02/19/2020   Pneumococcal Conjugate-13 10/03/2016   Pneumococcal Polysaccharide-23 09/28/2015   Td 08/30/2007   Tdap 08/30/2007,  04/30/2018, 12/24/2022   Typhoid Parenteral, AKD ([place]  [employer]) 08/30/2007, 05/05/2015   Yellow Fever 08/30/2007    TDAP status: Up to date  Flu Vaccine status: Up to date  Pneumococcal vaccine status: Up to date  Covid-19 vaccine status: Information provided on how to obtain vaccines.   Qualifies for Shingles Vaccine? Yes   Zostavax completed No   Shingrix Completed?: No.    Education has been provided regarding the importance of this vaccine. Patient has been advised to call insurance company to determine out of pocket expense if they have not yet received this vaccine. Advised may also receive vaccine at local pharmacy or Health Dept. Verbalized acceptance and understanding.  Screening Tests Health Maintenance  Topic Date Due   Hepatitis B Vaccines (1 of 3 - 19+ 3-dose series) Never done   Zoster Vaccines- Shingrix (1 of 2) Never done   COVID-19 Vaccine (3 - Moderna risk series) 03/18/2020   Colonoscopy  08/12/2024   INFLUENZA VACCINE  05/24/2024   Medicare Annual Wellness (AWV)  05/09/2025   DTaP/Tdap/Td (5 - Td or Tdap) 12/23/2032   Hepatitis C Screening  Completed   Pneumococcal Vaccine 8-67 Years old  Aged Out   HPV VACCINES  Aged Out   Meningococcal B Vaccine  Aged Out   HIV Screening  Discontinued    Health Maintenance  Health Maintenance Due  Topic Date Due   Hepatitis B Vaccines (1 of 3 - 19+ 3-dose series) Never done   Zoster Vaccines- Shingrix (1 of 2) Never done   COVID-19 Vaccine (3 - Moderna risk series) 03/18/2020   Colonoscopy  08/12/2024    Colorectal cancer screening: Type of screening: Colonoscopy. Completed 2022. Repeat every 3 years  Lung Cancer Screening: (Low Dose CT Chest recommended if Age 22-80 years, 20 pack-year currently smoking OR have quit w/in 15years.) does not qualify.   Lung Cancer Screening Referral:   Additional Screening:  Hepatitis C Screening: does not qualify; Completed 2021  Vision Screening: Recommended annual  ophthalmology exams for early detection of glaucoma and other disorders of the eye. Is the patient up to date with their annual eye exam?  Yes  Who is the provider or what is the name of the office in which the patient attends annual eye exams? [facility]  If pt is not established with a provider, would they like to be referred to a provider to establish care? No .   Dental Screening: Recommended annual dental exams for proper oral hygiene    Community Resource Referral / Chronic Care Management: CRR required this visit?  No   CCM required this visit?  No     Plan:     I have personally reviewed and noted the following in the patient's chart:   Medical and social history Use of alcohol, tobacco or illicit drugs  Current medications and supplements including opioid prescriptions. Patient is not currently taking opioid prescriptions. Functional ability and status Nutritional status Physical activity Advanced directives List of other physicians Hospitalizations, surgeries, and ER visits in previous 12 months Vitals Screenings to include cognitive, depression, and falls Referrals and appointments  In addition, I have reviewed and  discussed with patient certain preventive protocols, quality metrics, and best practice recommendations. A written personalized care plan for preventive services as well as general preventive health recommendations were provided to patient.     Mliss Graff, LPN   [id number]   After Visit Summary: (MyChart) Due to this being a telephonic visit, the after visit summary with patients personalized plan was offered to patient via MyChart   Nurse Notes:

## 2024-05-10 ENCOUNTER — Encounter: Admitting: Physical Therapy

## 2024-05-16 ENCOUNTER — Encounter: Admitting: Rehabilitative and Restorative Service Providers"

## 2024-05-16 ENCOUNTER — Ambulatory Visit: Admitting: Rehabilitative and Restorative Service Providers"

## 2024-05-16 ENCOUNTER — Encounter: Payer: Self-pay | Admitting: Rehabilitative and Restorative Service Providers"

## 2024-05-16 DIAGNOSIS — R262 Difficulty in walking, not elsewhere classified: Secondary | ICD-10-CM | POA: Diagnosis not present

## 2024-05-16 DIAGNOSIS — G8929 Other chronic pain: Secondary | ICD-10-CM

## 2024-05-16 DIAGNOSIS — M25511 Pain in right shoulder: Secondary | ICD-10-CM

## 2024-05-16 DIAGNOSIS — M25562 Pain in left knee: Secondary | ICD-10-CM

## 2024-05-16 DIAGNOSIS — M6281 Muscle weakness (generalized): Secondary | ICD-10-CM

## 2024-05-16 DIAGNOSIS — M79605 Pain in left leg: Secondary | ICD-10-CM

## 2024-05-16 NOTE — Therapy (Signed)
 OUTPATIENT PHYSICAL THERAPY  TREATMENT   Patient Name: Jonathon Vazquez MRN: [id number] DOB:14-Aug-1971, 53 y.o., male Today's Date: 05/16/2024  END OF SESSION:  PT End of Session - 05/16/24 1433     Visit Number 3    Number of Visits 20    Date for PT Re-Evaluation 07/02/24    Authorization Type [employer]    Progress Note Due on Visit 10    PT Start Time 1430    PT Stop Time 1510    PT Time Calculation (min) 40 min    Activity Tolerance Patient tolerated treatment well    Behavior During Therapy [facility] for tasks assessed/performed            Past Medical History:  Diagnosis Date   Anal fistula 1996   Anal stenosis    Atrial fibrillation (HCC)    pt and wife deny this   Colon stricture (HCC)    descending colon   Crohn's disease (HCC)    Hx of colonic polyp 10/21/2016   MRSA (methicillin resistant Staphylococcus aureus)    Multiple sclerosis (HCC)    Neuromuscular disorder (HCC)    Patent ductus arteriosus    Regional enteritis of large intestine (HCC)    S/P left hemicolectomy 2015   [facility]-done for stricture   Stricture of descending colon due to Crohn's disease 02/13/2012   Past Surgical History:  Procedure Laterality Date   COLON SURGERY  2015   descending colon stricture    COLONOSCOPY     multiple   Patient Active Problem List   Diagnosis Date Noted   Left wrist pain 09/15/2021   Relapsing remitting multiple sclerosis (HCC) 09/09/2021   High risk medication use 09/09/2021   Crohn's disease of small intestine without complication (HCC) 08/22/2019   Cellulitis 06/24/2018   Patent ductus arteriosus    MRSA (methicillin resistant Staphylococcus aureus)    Crohn's disease (HCC)    Therapeutic drug monitoring 06/13/2017   Encounter for therapeutic drug monitoring 02/09/2017   Visual changes 12/07/2016   Insomnia 12/07/2016   Hx of colonic polyp 10/21/2016   PDA (patent ductus arteriosus) 03/01/2012   Multiple sclerosis (HCC) 12/11/2008   Crohn's colitis- hx  strictures and resection 12/11/2008    PCP: Duanne Butler DASEN, MD  REFERRING PROVIDER: Duanne Butler DASEN, MD  REFERRING DIAG: S86.112A (ICD-10-CM) - Strain of gastrocnemius muscle of left lower extremity, initial encounter  THERAPY DIAG:  Acute pain of left knee  Pain in left leg  Muscle weakness (generalized)  Difficulty in walking, not elsewhere classified  Chronic right shoulder pain  Rationale for Evaluation and Treatment: Rehabilitation  ONSET DATE: may/June 2025  SUBJECTIVE:   SUBJECTIVE STATEMENT: Pt indicated not hurting in knee today.  Reported feeling improvement noted with use of brace.  Reported one complaint of squatting on floor.  Pt indicated shoulder hasn't been much of an issue. Reported improvement on that as well.    PERTINENT HISTORY: Seen by clinic in 2024 for shoulder pain  PAIN:  NPRS scale: 4/10 today Pain location: knee/lower leg - current knee Pain description: sharp, numbness at times.  Aggravating factors: standing, walking, stairs, bending.  Relieving factors: nothing specific other than taking weight off.   PRECAUTIONS: None  WEIGHT BEARING RESTRICTIONS: No  FALLS:  Has patient fallen in last 6 months? No  LIVING ENVIRONMENT: Lives in: House/apartment Stairs to entry 3 with no hand rails  OCCUPATION: Disability   PLOF: Independent, Lt hand dominant, going to softball games for family  member. Goes to gym 2x/week typically for whole body exercise. Limited due to Rt arm symptoms.   PATIENT GOALS: Reduce pain   OBJECTIVE:   IMAGING 05/16/2024 review of 05/03/2024 MRI: IMPRESSION: Thickening at the femoral attachment of the MCL with mild irregularity. There are intact fibers. Correlation for MCL symptoms. This at least represents is high-grade sprain. There is a large reactive joint effusion and a leaking Baker's cyst.   Severe chondromalacia the central trochlea with subchondral reactive edema full-thickness cartilage loss.    No medial or lateral meniscal tear.  PATIENT SURVEYS:  Patient-Specific Activity Scoring Scheme  0 represents "unable to perform." 10 represents "able to perform at prior level. 0 1 2 3 4 5 6 7 8 9  10 (Date and Score)   Activity Eval  04/23/2024 05/16/2024   1. Walking  4 10   2. Bending/squatting  3  7  3. Stairs 3 7  4.    5.    Score 3.667 8 avg   Total score = sum of the activity scores/number of activities Minimum detectable change (90%CI) for average score = 2 points Minimum detectable change (90%CI) for single activity score = 3 points  COGNITION: 04/23/2024 Overall cognitive status: WFL    SENSATION: 04/23/2024 WFL  EDEMA:  04/23/2024 Joint line  16.5 inch Lt 15.5 inch Rt   MUSCLE LENGTH: 04/23/2024 No specific testing  POSTURE:  04/23/2024 Unremarkable   PALPATION: 04/29/2024: Trigger points in Rt infraspinatus noted.   04/23/2024 Tenderness posterior knee, distal medial hamstring tendons, Anterior inferior patellar ligament.   LOWER EXTREMITY ROM:   ROM Right Eval 04/23/2024 Left Eval 04/23/2024 Left 04/29/2024  Hip flexion     Hip extension     Hip abduction     Hip adduction     Hip internal rotation     Hip external rotation     Knee flexion   120 AROM in supine heel slide  Knee extension  -19 AROM seated LAQ  0 in heel prop -9 AROM in seated LAQ  Ankle dorsiflexion     Ankle plantarflexion     Ankle inversion     Ankle eversion      (Blank rows = not tested)  LOWER EXTREMITY MMT:  MMT Right Eval 04/23/2024 Left Eval 04/23/2024   Hip flexion 5/5 5/5   Hip extension     Hip abduction     Hip adduction     Hip internal rotation     Hip external rotation     Knee flexion 5/5 4/5 With pain anterior knee   Knee extension 5/5 4/5 With pain anterior knee   Ankle dorsiflexion 5/5 5/5   Ankle plantarflexion     Ankle inversion 5/5 5/5   Ankle eversion 5/5 5/5    (Blank rows = not tested)  UPPER EXTREMITY MMT:  MMT Right eval  Left eval  Shoulder flexion 5/5 5/5  Shoulder extension    Shoulder abduction 4/5 c pain 5/5  Shoulder adduction    Shoulder extension    Shoulder internal rotation 5/5 5/5  Shoulder external rotation 4/5 5/5  Middle trapezius    Lower trapezius    Elbow flexion    Elbow extension    Wrist flexion    Wrist extension    Wrist ulnar deviation    Wrist radial deviation    Wrist pronation    Wrist supination    Grip strength     (Blank rows = not tested)   LOWER  EXTREMITY SPECIAL TESTS:  04/23/2024 No specific testing  FUNCTIONAL TESTS:  04/23/2024 18 inch chair transfer: pain in back and front of Lt knee. Able to perform on 1st try Lt SLS: unable unassisted.  Pain noted  Rt SLS: < 5 seconds  GAIT: 04/23/2024 Independent ambulation.                                                                                                                                                                        TODAY'S TREATMENT                                                                          DATE: 05/16/2024 Therex: Recumbent bike lvl 3 10 mins with 10 seconds intervals at top of each minute.  Time spent in education of activity.  Review of   TherActivity (stairs improvement, transfers,etc) Leg press double leg 12 lbs x 15, single leg 75 lbs 2 x 15 bilateral  Lateral step down 4 inch step x 15 slowly WB on Lt leg   Neuro Re-ed SLS on airex pad with contralateral foot tapping corners x 8 each, performed bilaterally     Manual Lt sidelying Rt shoulder percussive device to Rt infraspinatus.     TODAY'S TREATMENT                                                                          DATE: 04/29/2024 Therex: Nustep Lvl 5 8 mins UE/LE for knee ROM. (Mild symptoms noted in increased flexion mobility Lt knee) Supine quad set 5 sec hold x 10 bilaterally Seated  Rt arm cross over stretch 15 sec x 3 Instruction for sidelying Rt shoulder ER with 1-2 lbs in HEP.  Supine Lt knee heel  slide 5 sec hold x 10 to tolerance.  Seated Lt quad set with SLR x 5 (stopped due to pain)  Leg press double leg 75 lbs x 15 in available knee range without pain, single leg Lt 37 lbs x 15  Manual Compression c movement active for infraspinatus with ER in sidelying.  Percussive device to Rt infraspinatus.   TODAY'S TREATMENT  DATE: 04/23/2024 Therex:    HEP instruction/performance c cues for techniques, handout provided.  Trial set performed of each for comprehension and symptom assessment.  See below for exercise list  Vaso 10 mins Lt knee in elevation medium compression 34 degrees.   PATIENT EDUCATION:  04/23/2024 Education details: HEP, POC Person educated: Patient Education method: Programmer, multimedia, Demonstration, Verbal cues, and Handouts Education comprehension: verbalized understanding, returned demonstration, and verbal cues required  HOME EXERCISE PROGRAM: Access Code: [email or web address] URL: [email or web address] Date: 04/29/2024 Prepared by: Ozell Silvan  Exercises - Supine Quadricep Sets  - 2-3 x daily - 7 x weekly - 1 sets - 10 reps - 5 hold - Seated Quad Set (Mirrored)  - 2-3 x daily - 7 x weekly - 1 sets - 10 reps - 5 hold - Seated Long Arc Quad (Mirrored)  - 3-5 x daily - 7 x weekly - 1-2 sets - 10 reps - 2 hold - Seated Gastroc Stretch with Strap (Mirrored)  - 2-3 x daily - 7 x weekly - 1 sets - 5 reps - 30 hold - Standing Shoulder Posterior Capsule Stretch (Mirrored)  - 2 x daily - 7 x weekly - 1 sets - 5 reps - 15-30 hold - Sidelying Shoulder ER with Towel and Dumbbell (Mirrored)  - 1-2 x daily - 7 x weekly - 2-3 sets - 10-20 reps  ASSESSMENT:  CLINICAL IMPRESSION: Pt reported improvement in symptoms for shoulder and leg.  Patient specific functional scale was improved greatly.  MRI was reviewed and discussed with PA on patients last visit to PA.  Plan to continue to improve functional  strength and activity tolerances.   OBJECTIVE IMPAIRMENTS: Abnormal gait, decreased activity tolerance, decreased balance, decreased coordination, decreased endurance, decreased mobility, difficulty walking, decreased ROM, decreased strength, hypomobility, increased edema, increased fascial restrictions, impaired perceived functional ability, increased muscle spasms, impaired flexibility, improper body mechanics, and pain.   ACTIVITY LIMITATIONS: carrying, lifting, bending, sitting, standing, squatting, sleeping, stairs, transfers, bed mobility, and locomotion level  PARTICIPATION LIMITATIONS: meal prep, cleaning, laundry, interpersonal relationship, driving, shopping, and community activity  PERSONAL FACTORS: multiple body parts, Crohn's Disease, MS (see chart list) are also affecting patient's functional outcome.   REHAB POTENTIAL: Good  CLINICAL DECISION MAKING: Evolving/moderate complexity  EVALUATION COMPLEXITY: Moderate   GOALS: Goals reviewed with patient? Yes  SHORT TERM GOALS: (target date for Short term goals are 3 weeks 05/14/2024)   1.  Patient will demonstrate independent use of home exercise program to maintain progress from in clinic treatments.  Goal status: Met  LONG TERM GOALS: (target dates for all long term goals are 10 weeks  07/02/2024 )   1. Patient will demonstrate/report pain at worst less than or equal to 2/10 to facilitate minimal limitation in daily activity secondary to pain symptoms.  Goal status: on going 05/16/2024   2. Patient will demonstrate independent use of home exercise program to facilitate ability to maintain/progress functional gains from skilled physical therapy services.  Goal status: on going 05/16/2024   3. Patient will demonstrate Patient specific functional scale avg > or = 8/10 to indicate reduced disability due to condition.   Goal status: Met 05/16/2024   4.  Patient will demonstrate bilateral LE MMT 5/5 throughout to faciltiate  usual transfers, stairs, squatting at [facility] for daily life.   Goal status: on going 05/16/2024   5.  Patient will demonstrate Lt knee AROM full range s symptoms to facilitate usual mobility at PLOF.  Goal status: on going 05/16/2024  6.  Patient will demonstrate ascending/descending stairs reciprocally s UE assist for community integration.   Goal status: on going 05/16/2024   7.  Patient will demonstrate bilateral SLS > 10 seconds for stability in ambulation.  Goal Status: on going 05/16/2024   PLAN:  PT FREQUENCY: 1-2x/week  PT DURATION: 10 weeks  PLANNED INTERVENTIONS: Can include 02853- PT Re-evaluation, 97110-Therapeutic exercises, 97530- Therapeutic activity, 97112- Neuromuscular re-education, 97535- Self Care, 97140- Manual therapy, [phone]- Gait training, [phone]- Orthotic Fit/training, [phone]- Canalith repositioning, [id number]- Aquatic Therapy, [phone]- Electrical stimulation (unattended), [id number] Physical performance testing, 97016- Vasopneumatic device, [id number]- Ultrasound, [id number]- Traction (mechanical), [id number]- Ionotophoresis 4mg /ml Dexamethasone,  20560 - Needle insertion w/o injection 1 or 2 muscles, 20561 - Needle insertion w/o injection 3 or more muscles.   Patient/Family education, Balance training, Stair training, Taping, Dry Needling, Joint mobilization, Joint manipulation, Spinal manipulation, Spinal mobilization, Scar mobilization, Vestibular training, Visual/preceptual remediation/compensation, DME instructions, Cryotherapy, and Moist heat.  All performed as medically necessary.  All included unless contraindicated  PLAN FOR NEXT SESSION: recheck strength and motion.    Ozell Silvan, PT, DPT, OCS, ATC 05/16/24  3:08 PM

## 2024-05-22 ENCOUNTER — Telehealth: Payer: Self-pay | Admitting: Rehabilitative and Restorative Service Providers"

## 2024-05-22 ENCOUNTER — Encounter: Admitting: Rehabilitative and Restorative Service Providers"

## 2024-05-22 NOTE — Telephone Encounter (Signed)
 Called patient after 15 mins no show for appointment today.  Called patient and he indicated he forgot about appointment.  Reminded of next appointment on Friday 05/24/2024.   Ozell Silvan, PT, DPT, OCS, ATC 05/22/24  2:50 PM

## 2024-05-22 NOTE — Therapy (Incomplete)
 OUTPATIENT PHYSICAL THERAPY  TREATMENT   Patient Name: Jonathon Vazquez MRN: [id number] DOB:06-27-1971, 53 y.o., male Today's Date: 05/22/2024  END OF SESSION:      Past Medical History:  Diagnosis Date   Anal fistula 1996   Anal stenosis    Atrial fibrillation (HCC)    pt and wife deny this   Colon stricture (HCC)    descending colon   Crohn's disease (HCC)    Hx of colonic polyp 10/21/2016   MRSA (methicillin resistant Staphylococcus aureus)    Multiple sclerosis (HCC)    Neuromuscular disorder (HCC)    Patent ductus arteriosus    Regional enteritis of large intestine (HCC)    S/P left hemicolectomy 2015   [facility]-done for stricture   Stricture of descending colon due to Crohn's disease 02/13/2012   Past Surgical History:  Procedure Laterality Date   COLON SURGERY  2015   descending colon stricture    COLONOSCOPY     multiple   Patient Active Problem List   Diagnosis Date Noted   Left wrist pain 09/15/2021   Relapsing remitting multiple sclerosis (HCC) 09/09/2021   High risk medication use 09/09/2021   Crohn's disease of small intestine without complication (HCC) 08/22/2019   Cellulitis 06/24/2018   Patent ductus arteriosus    MRSA (methicillin resistant Staphylococcus aureus)    Crohn's disease (HCC)    Therapeutic drug monitoring 06/13/2017   Encounter for therapeutic drug monitoring 02/09/2017   Visual changes 12/07/2016   Insomnia 12/07/2016   Hx of colonic polyp 10/21/2016   PDA (patent ductus arteriosus) 03/01/2012   Multiple sclerosis (HCC) 12/11/2008   Crohn's colitis- hx strictures and resection 12/11/2008    PCP: Duanne Butler DASEN, MD  REFERRING PROVIDER: Duanne Butler DASEN, MD  REFERRING DIAG: S86.112A (ICD-10-CM) - Strain of gastrocnemius muscle of left lower extremity, initial encounter  THERAPY DIAG:  No diagnosis found.  Rationale for Evaluation and Treatment: Rehabilitation  ONSET DATE: may/June 2025  SUBJECTIVE:   SUBJECTIVE  STATEMENT: Pt indicated not hurting in knee today.  Reported feeling improvement noted with use of brace.  Reported one complaint of squatting on floor.  Pt indicated shoulder hasn't been much of an issue. Reported improvement on that as well.    PERTINENT HISTORY: Seen by clinic in 2024 for shoulder pain  PAIN:  NPRS scale: 4/10 today Pain location: knee/lower leg - current knee Pain description: sharp, numbness at times.  Aggravating factors: standing, walking, stairs, bending.  Relieving factors: nothing specific other than taking weight off.   PRECAUTIONS: None  WEIGHT BEARING RESTRICTIONS: No  FALLS:  Has patient fallen in last 6 months? No  LIVING ENVIRONMENT: Lives in: House/apartment Stairs to entry 3 with no hand rails  OCCUPATION: Disability   PLOF: Independent, Lt hand dominant, going to softball games for family member. Goes to gym 2x/week typically for whole body exercise. Limited due to Rt arm symptoms.   PATIENT GOALS: Reduce pain   OBJECTIVE:   IMAGING 05/16/2024 review of 05/03/2024 MRI: IMPRESSION: Thickening at the femoral attachment of the MCL with mild irregularity. There are intact fibers. Correlation for MCL symptoms. This at least represents is high-grade sprain. There is a large reactive joint effusion and a leaking Baker's cyst.   Severe chondromalacia the central trochlea with subchondral reactive edema full-thickness cartilage loss.   No medial or lateral meniscal tear.  PATIENT SURVEYS:  Patient-Specific Activity Scoring Scheme  0 represents "unable to perform." 10 represents "able to perform at prior level.  0 1 2 3 4 5 6 7 8 9  10 (Date and Score)   Activity Eval  04/23/2024 05/16/2024   1. Walking  4 10   2. Bending/squatting  3  7  3. Stairs 3 7  4.    5.    Score 3.667 8 avg   Total score = sum of the activity scores/number of activities Minimum detectable change (90%CI) for average score = 2 points Minimum detectable  change (90%CI) for single activity score = 3 points  COGNITION: 04/23/2024 Overall cognitive status: WFL    SENSATION: 04/23/2024 WFL  EDEMA:  04/23/2024 Joint line  16.5 inch Lt 15.5 inch Rt   MUSCLE LENGTH: 04/23/2024 No specific testing  POSTURE:  04/23/2024 Unremarkable   PALPATION: 04/29/2024: Trigger points in Rt infraspinatus noted.   04/23/2024 Tenderness posterior knee, distal medial hamstring tendons, Anterior inferior patellar ligament.   LOWER EXTREMITY ROM:   ROM Right Eval 04/23/2024 Left Eval 04/23/2024 Left 04/29/2024  Hip flexion     Hip extension     Hip abduction     Hip adduction     Hip internal rotation     Hip external rotation     Knee flexion   120 AROM in supine heel slide  Knee extension  -19 AROM seated LAQ  0 in heel prop -9 AROM in seated LAQ  Ankle dorsiflexion     Ankle plantarflexion     Ankle inversion     Ankle eversion      (Blank rows = not tested)  LOWER EXTREMITY MMT:  MMT Right Eval 04/23/2024 Left Eval 04/23/2024   Hip flexion 5/5 5/5   Hip extension     Hip abduction     Hip adduction     Hip internal rotation     Hip external rotation     Knee flexion 5/5 4/5 With pain anterior knee   Knee extension 5/5 4/5 With pain anterior knee   Ankle dorsiflexion 5/5 5/5   Ankle plantarflexion     Ankle inversion 5/5 5/5   Ankle eversion 5/5 5/5    (Blank rows = not tested)  UPPER EXTREMITY MMT:  MMT Right eval Left eval  Shoulder flexion 5/5 5/5  Shoulder extension    Shoulder abduction 4/5 c pain 5/5  Shoulder adduction    Shoulder extension    Shoulder internal rotation 5/5 5/5  Shoulder external rotation 4/5 5/5  Middle trapezius    Lower trapezius    Elbow flexion    Elbow extension    Wrist flexion    Wrist extension    Wrist ulnar deviation    Wrist radial deviation    Wrist pronation    Wrist supination    Grip strength     (Blank rows = not tested)   LOWER EXTREMITY SPECIAL TESTS:  04/23/2024 No  specific testing  FUNCTIONAL TESTS:  04/23/2024 18 inch chair transfer: pain in back and front of Lt knee. Able to perform on 1st try Lt SLS: unable unassisted.  Pain noted  Rt SLS: < 5 seconds  GAIT: 04/23/2024 Independent ambulation.  TODAY'S TREATMENT                                                                          DATE: 05/22/2024 Therex: Recumbent bike lvl 3 10 mins with 10 seconds intervals at top of each minute.  Time spent in education of activity.  Review of   TherActivity (stairs improvement, transfers,etc) Leg press double leg 12 lbs x 15, single leg 75 lbs 2 x 15 bilateral  Lateral step down 4 inch step x 15 slowly WB on Lt leg   Neuro Re-ed SLS on airex pad with contralateral foot tapping corners x 8 each, performed bilaterally     Manual Lt sidelying Rt shoulder percussive device to Rt infraspinatus.    TODAY'S TREATMENT                                                                          DATE: 05/16/2024 Therex: Recumbent bike lvl 3 10 mins with 10 seconds intervals at top of each minute.  Time spent in education of activity.  Review of   TherActivity (stairs improvement, transfers,etc) Leg press double leg 12 lbs x 15, single leg 75 lbs 2 x 15 bilateral  Lateral step down 4 inch step x 15 slowly WB on Lt leg   Neuro Re-ed SLS on airex pad with contralateral foot tapping corners x 8 each, performed bilaterally     Manual Lt sidelying Rt shoulder percussive device to Rt infraspinatus.     TODAY'S TREATMENT                                                                          DATE: 04/29/2024 Therex: Nustep Lvl 5 8 mins UE/LE for knee ROM. (Mild symptoms noted in increased flexion mobility Lt knee) Supine quad set 5 sec hold x 10 bilaterally Seated  Rt arm cross over stretch 15  sec x 3 Instruction for sidelying Rt shoulder ER with 1-2 lbs in HEP.  Supine Lt knee heel slide 5 sec hold x 10 to tolerance.  Seated Lt quad set with SLR x 5 (stopped due to pain)  Leg press double leg 75 lbs x 15 in available knee range without pain, single leg Lt 37 lbs x 15  Manual Compression c movement active for infraspinatus with ER in sidelying.  Percussive device to Rt infraspinatus.   TODAY'S TREATMENT  DATE: 04/23/2024 Therex:    HEP instruction/performance c cues for techniques, handout provided.  Trial set performed of each for comprehension and symptom assessment.  See below for exercise list  Vaso 10 mins Lt knee in elevation medium compression 34 degrees.   PATIENT EDUCATION:  04/23/2024 Education details: HEP, POC Person educated: Patient Education method: Programmer, multimedia, Demonstration, Verbal cues, and Handouts Education comprehension: verbalized understanding, returned demonstration, and verbal cues required  HOME EXERCISE PROGRAM: Access Code: [email or web address] URL: [email or web address] Date: 04/29/2024 Prepared by: Ozell Silvan  Exercises - Supine Quadricep Sets  - 2-3 x daily - 7 x weekly - 1 sets - 10 reps - 5 hold - Seated Quad Set (Mirrored)  - 2-3 x daily - 7 x weekly - 1 sets - 10 reps - 5 hold - Seated Long Arc Quad (Mirrored)  - 3-5 x daily - 7 x weekly - 1-2 sets - 10 reps - 2 hold - Seated Gastroc Stretch with Strap (Mirrored)  - 2-3 x daily - 7 x weekly - 1 sets - 5 reps - 30 hold - Standing Shoulder Posterior Capsule Stretch (Mirrored)  - 2 x daily - 7 x weekly - 1 sets - 5 reps - 15-30 hold - Sidelying Shoulder ER with Towel and Dumbbell (Mirrored)  - 1-2 x daily - 7 x weekly - 2-3 sets - 10-20 reps  ASSESSMENT:  CLINICAL IMPRESSION: Pt reported improvement in symptoms for shoulder and leg.  Patient specific functional scale was improved greatly.  MRI was reviewed and  discussed with PA on patients last visit to PA.  Plan to continue to improve functional strength and activity tolerances.   OBJECTIVE IMPAIRMENTS: Abnormal gait, decreased activity tolerance, decreased balance, decreased coordination, decreased endurance, decreased mobility, difficulty walking, decreased ROM, decreased strength, hypomobility, increased edema, increased fascial restrictions, impaired perceived functional ability, increased muscle spasms, impaired flexibility, improper body mechanics, and pain.   ACTIVITY LIMITATIONS: carrying, lifting, bending, sitting, standing, squatting, sleeping, stairs, transfers, bed mobility, and locomotion level  PARTICIPATION LIMITATIONS: meal prep, cleaning, laundry, interpersonal relationship, driving, shopping, and community activity  PERSONAL FACTORS: multiple body parts, Crohn's Disease, MS (see chart list) are also affecting patient's functional outcome.   REHAB POTENTIAL: Good  CLINICAL DECISION MAKING: Evolving/moderate complexity  EVALUATION COMPLEXITY: Moderate   GOALS: Goals reviewed with patient? Yes  SHORT TERM GOALS: (target date for Short term goals are 3 weeks 05/14/2024)   1.  Patient will demonstrate independent use of home exercise program to maintain progress from in clinic treatments.  Goal status: Met  LONG TERM GOALS: (target dates for all long term goals are 10 weeks  07/02/2024 )   1. Patient will demonstrate/report pain at worst less than or equal to 2/10 to facilitate minimal limitation in daily activity secondary to pain symptoms.  Goal status: on going 05/16/2024   2. Patient will demonstrate independent use of home exercise program to facilitate ability to maintain/progress functional gains from skilled physical therapy services.  Goal status: on going 05/16/2024   3. Patient will demonstrate Patient specific functional scale avg > or = 8/10 to indicate reduced disability due to condition.   Goal status: Met  05/16/2024   4.  Patient will demonstrate bilateral LE MMT 5/5 throughout to faciltiate usual transfers, stairs, squatting at [facility] for daily life.   Goal status: on going 05/16/2024   5.  Patient will demonstrate Lt knee AROM full range s symptoms to facilitate usual mobility at PLOF.  Goal status: on going 05/16/2024  6.  Patient will demonstrate ascending/descending stairs reciprocally s UE assist for community integration.   Goal status: on going 05/16/2024   7.  Patient will demonstrate bilateral SLS > 10 seconds for stability in ambulation.  Goal Status: on going 05/16/2024   PLAN:  PT FREQUENCY: 1-2x/week  PT DURATION: 10 weeks  PLANNED INTERVENTIONS: Can include 02853- PT Re-evaluation, 97110-Therapeutic exercises, 97530- Therapeutic activity, 97112- Neuromuscular re-education, 97535- Self Care, 97140- Manual therapy, [phone]- Gait training, [phone]- Orthotic Fit/training, [phone]- Canalith repositioning, [id number]- Aquatic Therapy, [id number]- Electrical stimulation (unattended), [id number] Physical performance testing, 97016- Vasopneumatic device, [id number]- Ultrasound, [id number]- Traction (mechanical), [id number]- Ionotophoresis 4mg /ml Dexamethasone,  20560 - Needle insertion w/o injection 1 or 2 muscles, 20561 - Needle insertion w/o injection 3 or more muscles.   Patient/Family education, Balance training, Stair training, Taping, Dry Needling, Joint mobilization, Joint manipulation, Spinal manipulation, Spinal mobilization, Scar mobilization, Vestibular training, Visual/preceptual remediation/compensation, DME instructions, Cryotherapy, and Moist heat.  All performed as medically necessary.  All included unless contraindicated  PLAN FOR NEXT SESSION: recheck strength and motion.    Ozell Silvan, PT, DPT, OCS, ATC 05/22/24  9:11 AM

## 2024-05-24 ENCOUNTER — Encounter: Admitting: Rehabilitative and Restorative Service Providers"

## 2024-05-28 ENCOUNTER — Ambulatory Visit: Admitting: Rehabilitative and Restorative Service Providers"

## 2024-05-28 ENCOUNTER — Encounter: Payer: Self-pay | Admitting: Rehabilitative and Restorative Service Providers"

## 2024-05-28 DIAGNOSIS — R262 Difficulty in walking, not elsewhere classified: Secondary | ICD-10-CM

## 2024-05-28 DIAGNOSIS — M79605 Pain in left leg: Secondary | ICD-10-CM | POA: Diagnosis not present

## 2024-05-28 DIAGNOSIS — M6281 Muscle weakness (generalized): Secondary | ICD-10-CM

## 2024-05-28 DIAGNOSIS — M25562 Pain in left knee: Secondary | ICD-10-CM | POA: Diagnosis not present

## 2024-05-28 DIAGNOSIS — G8929 Other chronic pain: Secondary | ICD-10-CM

## 2024-05-28 DIAGNOSIS — M25511 Pain in right shoulder: Secondary | ICD-10-CM

## 2024-05-28 NOTE — Therapy (Signed)
 OUTPATIENT PHYSICAL THERAPY  TREATMENT   Patient Name: Jonathon Vazquez MRN: [id number] DOB:May 28, 1971, 53 y.o., male Today's Date: 05/28/2024  END OF SESSION:  PT End of Session - 05/28/24 1512     Visit Number 4    Number of Visits 20    Date for PT Re-Evaluation 07/02/24    Authorization Type [employer]    Progress Note Due on Visit 10    PT Start Time 1512    PT Stop Time 1551    PT Time Calculation (min) 39 min    Activity Tolerance Patient tolerated treatment well    Behavior During Therapy WFL for tasks assessed/performed             Past Medical History:  Diagnosis Date   Anal fistula 1996   Anal stenosis    Atrial fibrillation (HCC)    pt and wife deny this   Colon stricture (HCC)    descending colon   Crohn's disease (HCC)    Hx of colonic polyp 10/21/2016   MRSA (methicillin resistant Staphylococcus aureus)    Multiple sclerosis (HCC)    Neuromuscular disorder (HCC)    Patent ductus arteriosus    Regional enteritis of large intestine (HCC)    S/P left hemicolectomy 2015   [facility]-done for stricture   Stricture of descending colon due to Crohn's disease 02/13/2012   Past Surgical History:  Procedure Laterality Date   COLON SURGERY  2015   descending colon stricture    COLONOSCOPY     multiple   Patient Active Problem List   Diagnosis Date Noted   Left wrist pain 09/15/2021   Relapsing remitting multiple sclerosis (HCC) 09/09/2021   High risk medication use 09/09/2021   Crohn's disease of small intestine without complication (HCC) 08/22/2019   Cellulitis 06/24/2018   Patent ductus arteriosus    MRSA (methicillin resistant Staphylococcus aureus)    Crohn's disease (HCC)    Therapeutic drug monitoring 06/13/2017   Encounter for therapeutic drug monitoring 02/09/2017   Visual changes 12/07/2016   Insomnia 12/07/2016   Hx of colonic polyp 10/21/2016   PDA (patent ductus arteriosus) 03/01/2012   Multiple sclerosis (HCC) 12/11/2008   Crohn's colitis- hx  strictures and resection 12/11/2008    PCP: Duanne Butler DASEN, MD  REFERRING PROVIDER: Duanne Butler DASEN, MD  REFERRING DIAG: S86.112A (ICD-10-CM) - Strain of gastrocnemius muscle of left lower extremity, initial encounter  THERAPY DIAG:  Acute pain of left knee  Pain in left leg  Muscle weakness (generalized)  Difficulty in walking, not elsewhere classified  Chronic right shoulder pain  Rationale for Evaluation and Treatment: Rehabilitation  ONSET DATE: may/June 2025  SUBJECTIVE:   SUBJECTIVE STATEMENT: Pt indicated continued to do better.  Pt indicated knee definitely doing better.  Shoulder comes and goes but not as bad as before.   PERTINENT HISTORY: Seen by clinic in 2024 for shoulder pain  PAIN:  NPRS scale: 4/10 today Pain location: knee/lower leg - current knee Pain description: sharp, numbness at times.  Aggravating factors: standing, walking, stairs, bending.  Relieving factors: nothing specific other than taking weight off.   PRECAUTIONS: None  WEIGHT BEARING RESTRICTIONS: No  FALLS:  Has patient fallen in last 6 months? No  LIVING ENVIRONMENT: Lives in: House/apartment Stairs to entry 3 with no hand rails  OCCUPATION: Disability   PLOF: Independent, Lt hand dominant, going to softball games for family member. Goes to gym 2x/week typically for whole body exercise. Limited due to Rt arm symptoms.  PATIENT GOALS: Reduce pain   OBJECTIVE:   IMAGING 05/16/2024 review of 05/03/2024 MRI: IMPRESSION: Thickening at the femoral attachment of the MCL with mild irregularity. There are intact fibers. Correlation for MCL symptoms. This at least represents is high-grade sprain. There is a large reactive joint effusion and a leaking Baker's cyst.   Severe chondromalacia the central trochlea with subchondral reactive edema full-thickness cartilage loss.   No medial or lateral meniscal tear.  PATIENT SURVEYS:  Patient-Specific Activity Scoring  Scheme  0 represents "unable to perform." 10 represents "able to perform at prior level. 0 1 2 3 4 5 6 7 8 9  10 (Date and Score)   Activity Eval  04/23/2024 05/16/2024   1. Walking  4 10   2. Bending/squatting  3  7  3. Stairs 3 7  4.    5.    Score 3.667 8 avg   Total score = sum of the activity scores/number of activities Minimum detectable change (90%CI) for average score = 2 points Minimum detectable change (90%CI) for single activity score = 3 points  COGNITION: 04/23/2024 Overall cognitive status: WFL    SENSATION: 04/23/2024 WFL  EDEMA:  04/23/2024 Joint line  16.5 inch Lt 15.5 inch Rt   MUSCLE LENGTH: 04/23/2024 No specific testing  POSTURE:  04/23/2024 Unremarkable   PALPATION: 04/29/2024: Trigger points in Rt infraspinatus noted.   04/23/2024 Tenderness posterior knee, distal medial hamstring tendons, Anterior inferior patellar ligament.   LOWER EXTREMITY ROM:   ROM Right Eval 04/23/2024 Left Eval 04/23/2024 Left 04/29/2024  Hip flexion     Hip extension     Hip abduction     Hip adduction     Hip internal rotation     Hip external rotation     Knee flexion   120 AROM in supine heel slide  Knee extension  -19 AROM seated LAQ  0 in heel prop -9 AROM in seated LAQ  Ankle dorsiflexion     Ankle plantarflexion     Ankle inversion     Ankle eversion      (Blank rows = not tested)  LOWER EXTREMITY MMT:  MMT Right Eval 04/23/2024 Left Eval 04/23/2024 Right 05/28/2024 Left 05/28/2024  Hip flexion 5/5 5/5    Hip extension      Hip abduction      Hip adduction      Hip internal rotation      Hip external rotation      Knee flexion 5/5 4/5 With pain anterior knee    Knee extension 5/5 4/5 With pain anterior knee 5/5 79.2, 79.6 lbs 5/5 79.7, 76 lbs  Ankle dorsiflexion 5/5 5/5    Ankle plantarflexion      Ankle inversion 5/5 5/5    Ankle eversion 5/5 5/5     (Blank rows = not tested)  UPPER EXTREMITY MMT:  MMT Right eval Left eval  Right 05/28/2024  Shoulder flexion 5/5 5/5   Shoulder extension     Shoulder abduction 4/5 c pain 5/5 5/5  Shoulder adduction     Shoulder extension     Shoulder internal rotation 5/5 5/5   Shoulder external rotation 4/5 5/5 5/5  Middle trapezius     Lower trapezius     Elbow flexion     Elbow extension     Wrist flexion     Wrist extension     Wrist ulnar deviation     Wrist radial deviation     Wrist pronation  Wrist supination     Grip strength      (Blank rows = not tested)   LOWER EXTREMITY SPECIAL TESTS:  04/23/2024 No specific testing  FUNCTIONAL TESTS:  05/28/2024: Flight of stairs reciprocally without complaints.   04/23/2024 18 inch chair transfer: pain in back and front of Lt knee. Able to perform on 1st try Lt SLS: unable unassisted.  Pain noted  Rt SLS: < 5 seconds  GAIT: 04/23/2024 Independent ambulation.                                                                                                                                                                        TODAY'S TREATMENT                                                                          DATE: 05/28/2024 Therex: Recumbent bike lvl 3 10 mins  Knee extension double leg up, Lt leg lowering 2 x 10  35 lbs  Knee flexion Lt leg only 35 lbs 2 x 10 Cues for HEP updates with handout provided.   Neuro Re-ed Rt shoulder ER walk outs blue band with towel under arm Rt shoulder 5 sec hold x 10  Tband rows blue band c scapular retraction focus 2 x 15 Tband gh ext blue band 2 x 15    TherActivity (stairs improvement, transfers,etc) Flight of stairs up/down reciprocal gait patten with minimal HHA.    TODAY'S TREATMENT                                                                          DATE: 05/16/2024 Therex: Recumbent bike lvl 3 10 mins with 10 seconds intervals at top of each minute.  Time spent in education of activity.  Review of   TherActivity (stairs improvement, transfers,etc) Leg  press double leg 12 lbs x 15, single leg 75 lbs 2 x 15 bilateral  Lateral step down 4 inch step x 15 slowly WB on Lt leg   Neuro Re-ed SLS on airex pad with contralateral foot tapping corners x 8 each, performed bilaterally     Manual Lt sidelying Rt shoulder percussive device to Rt infraspinatus.  TODAY'S TREATMENT  DATE: 04/29/2024 Therex: Nustep Lvl 5 8 mins UE/LE for knee ROM. (Mild symptoms noted in increased flexion mobility Lt knee) Supine quad set 5 sec hold x 10 bilaterally Seated  Rt arm cross over stretch 15 sec x 3 Instruction for sidelying Rt shoulder ER with 1-2 lbs in HEP.  Supine Lt knee heel slide 5 sec hold x 10 to tolerance.  Seated Lt quad set with SLR x 5 (stopped due to pain)  Leg press double leg 75 lbs x 15 in available knee range without pain, single leg Lt 37 lbs x 15  Manual Compression c movement active for infraspinatus with ER in sidelying.  Percussive device to Rt infraspinatus.   TODAY'S TREATMENT                                                                          DATE: 04/23/2024 Therex:    HEP instruction/performance c cues for techniques, handout provided.  Trial set performed of each for comprehension and symptom assessment.  See below for exercise list  Vaso 10 mins Lt knee in elevation medium compression 34 degrees.   PATIENT EDUCATION:  05/28/2024 Education details: HEP update Person educated: Patient Education method: Programmer, multimedia, Demonstration, Verbal cues, and Handouts Education comprehension: verbalized understanding, returned demonstration, and verbal cues required  HOME EXERCISE PROGRAM: Access Code: [email or web address] URL: [email or web address] Date: 05/28/2024 Prepared by: Ozell Silvan  Exercises - Supine Quadricep Sets  - 2-3 x daily - 7 x weekly - 1 sets - 10 reps - 5 hold - Seated Quad Set (Mirrored)  - 2-3 x daily - 7 x weekly - 1 sets - 10 reps  - 5 hold - Seated Long Arc Quad (Mirrored)  - 3-5 x daily - 7 x weekly - 1-2 sets - 10 reps - 2 hold - Seated Gastroc Stretch with Strap (Mirrored)  - 2-3 x daily - 7 x weekly - 1 sets - 5 reps - 30 hold - Standing Shoulder Posterior Capsule Stretch (Mirrored)  - 2 x daily - 7 x weekly - 1 sets - 5 reps - 15-30 hold - Sidelying Shoulder ER with Towel and Dumbbell  - 1-2 x daily - 7 x weekly - 2-3 sets - 10-20 reps - Seated SLR (Mirrored)  - 1-2 x daily - 7 x weekly - 1-2 sets - 10-15 reps - 2 hold - Standing Bilateral Low Shoulder Row with Anchored Resistance  - 1 x daily - 7 x weekly - 2-3 sets - 10-15 reps - Shoulder Extension with Resistance  - 1 x daily - 7 x weekly - 1-2 sets - 10-15 reps - Shoulder External Rotation Reactive Isometrics (Mirrored)  - 1 x daily - 7 x weekly - 1 sets - 10 reps - 5-15 hold  ASSESSMENT:  CLINICAL IMPRESSION: Strength testing showed good improvement today vs. Previous with continued reduction of symptoms noted for all areas.  Due to gains, recommended decreased frequency of visits with HEP trial and encouraged gym attendance for continued gains.  Will return in approx. 1 week for possible discharge pending symptoms.   OBJECTIVE IMPAIRMENTS: Abnormal gait, decreased activity tolerance, decreased balance, decreased coordination, decreased endurance, decreased mobility, difficulty walking, decreased ROM, decreased strength, hypomobility, increased edema, increased  fascial restrictions, impaired perceived functional ability, increased muscle spasms, impaired flexibility, improper body mechanics, and pain.   ACTIVITY LIMITATIONS: carrying, lifting, bending, sitting, standing, squatting, sleeping, stairs, transfers, bed mobility, and locomotion level  PARTICIPATION LIMITATIONS: meal prep, cleaning, laundry, interpersonal relationship, driving, shopping, and community activity  PERSONAL FACTORS: multiple body parts, Crohn's Disease, MS (see chart list) are also  affecting patient's functional outcome.   REHAB POTENTIAL: Good  CLINICAL DECISION MAKING: Evolving/moderate complexity  EVALUATION COMPLEXITY: Moderate   GOALS: Goals reviewed with patient? Yes  SHORT TERM GOALS: (target date for Short term goals are 3 weeks 05/14/2024)   1.  Patient will demonstrate independent use of home exercise program to maintain progress from in clinic treatments.  Goal status: Met  LONG TERM GOALS: (target dates for all long term goals are 10 weeks  07/02/2024 )   1. Patient will demonstrate/report pain at worst less than or equal to 2/10 to facilitate minimal limitation in daily activity secondary to pain symptoms.  Goal status: on going 05/16/2024   2. Patient will demonstrate independent use of home exercise program to facilitate ability to maintain/progress functional gains from skilled physical therapy services.  Goal status: on going 05/16/2024   3. Patient will demonstrate Patient specific functional scale avg > or = 8/10 to indicate reduced disability due to condition.   Goal status: Met 05/16/2024   4.  Patient will demonstrate bilateral LE MMT 5/5 throughout to faciltiate usual transfers, stairs, squatting at [facility] for daily life.   Goal status: Met 05/28/2024   5.  Patient will demonstrate Lt knee AROM full range s symptoms to facilitate usual mobility at PLOF.  Goal status: on going 05/16/2024   6.  Patient will demonstrate ascending/descending stairs reciprocally s UE assist for community integration.   Goal status:Met 05/28/2024   7.  Patient will demonstrate bilateral SLS > 10 seconds for stability in ambulation.  Goal Status: on going 05/16/2024   PLAN:  PT FREQUENCY: 1-2x/week  PT DURATION: 10 weeks  PLANNED INTERVENTIONS: Can include 02853- PT Re-evaluation, 97110-Therapeutic exercises, 97530- Therapeutic activity, 97112- Neuromuscular re-education, 97535- Self Care, 97140- Manual therapy, [phone]- Gait training, [phone]- Orthotic  Fit/training, [phone]- Canalith repositioning, [id number]- Aquatic Therapy, [phone]- Electrical stimulation (unattended), [id number] Physical performance testing, 97016- Vasopneumatic device, [id number]- Ultrasound, [id number]- Traction (mechanical), [id number]- Ionotophoresis 4mg /ml Dexamethasone,  20560 - Needle insertion w/o injection 1 or 2 muscles, 20561 - Needle insertion w/o injection 3 or more muscles.   Patient/Family education, Balance training, Stair training, Taping, Dry Needling, Joint mobilization, Joint manipulation, Spinal manipulation, Spinal mobilization, Scar mobilization, Vestibular training, Visual/preceptual remediation/compensation, DME instructions, Cryotherapy, and Moist heat.  All performed as medically necessary.  All included unless contraindicated  PLAN FOR NEXT SESSION: Possible discharge to HEP?   Ozell Silvan, PT, DPT, OCS, ATC 05/28/24  3:51 PM

## 2024-05-30 ENCOUNTER — Encounter: Admitting: Rehabilitative and Restorative Service Providers"

## 2024-06-03 ENCOUNTER — Encounter: Admitting: Rehabilitative and Restorative Service Providers"

## 2024-06-05 ENCOUNTER — Ambulatory Visit: Admitting: Rehabilitative and Restorative Service Providers"

## 2024-06-05 ENCOUNTER — Encounter: Payer: Self-pay | Admitting: Rehabilitative and Restorative Service Providers"

## 2024-06-05 DIAGNOSIS — M79605 Pain in left leg: Secondary | ICD-10-CM | POA: Diagnosis not present

## 2024-06-05 DIAGNOSIS — M25562 Pain in left knee: Secondary | ICD-10-CM

## 2024-06-05 DIAGNOSIS — G8929 Other chronic pain: Secondary | ICD-10-CM

## 2024-06-05 DIAGNOSIS — R262 Difficulty in walking, not elsewhere classified: Secondary | ICD-10-CM | POA: Diagnosis not present

## 2024-06-05 DIAGNOSIS — M6281 Muscle weakness (generalized): Secondary | ICD-10-CM

## 2024-06-05 DIAGNOSIS — M25511 Pain in right shoulder: Secondary | ICD-10-CM

## 2024-06-05 NOTE — Therapy (Signed)
 OUTPATIENT PHYSICAL THERAPY  TREATMENT/ DISCHARGE   Patient Name: Jonathon Vazquez MRN: [id number] DOB:Sep 24, 1971, 53 y.o., male Today's Date: 06/05/2024  PHYSICAL THERAPY DISCHARGE SUMMARY  Visits from Start of Care: 5  Current functional level related to goals / functional outcomes: See note   Remaining deficits: See note   Education / Equipment: HEP  Patient goals were met. Patient is being discharged due to meeting the stated rehab goals.   END OF SESSION:  PT End of Session - 06/05/24 1521     Visit Number 5    Number of Visits 20    Date for PT Re-Evaluation 07/02/24    Authorization Type [employer]    Progress Note Due on Visit 10    PT Start Time 1512    PT Stop Time 1551    PT Time Calculation (min) 39 min    Activity Tolerance Patient tolerated treatment well    Behavior During Therapy WFL for tasks assessed/performed              Past Medical History:  Diagnosis Date   Anal fistula 1996   Anal stenosis    Atrial fibrillation (HCC)    pt and wife deny this   Colon stricture (HCC)    descending colon   Crohn's disease (HCC)    Hx of colonic polyp 10/21/2016   MRSA (methicillin resistant Staphylococcus aureus)    Multiple sclerosis (HCC)    Neuromuscular disorder (HCC)    Patent ductus arteriosus    Regional enteritis of large intestine (HCC)    S/P left hemicolectomy 2015   [facility]-done for stricture   Stricture of descending colon due to Crohn's disease 02/13/2012   Past Surgical History:  Procedure Laterality Date   COLON SURGERY  2015   descending colon stricture    COLONOSCOPY     multiple   Patient Active Problem List   Diagnosis Date Noted   Left wrist pain 09/15/2021   Relapsing remitting multiple sclerosis (HCC) 09/09/2021   High risk medication use 09/09/2021   Crohn's disease of small intestine without complication (HCC) 08/22/2019   Cellulitis 06/24/2018   Patent ductus arteriosus    MRSA (methicillin resistant Staphylococcus  aureus)    Crohn's disease (HCC)    Therapeutic drug monitoring 06/13/2017   Encounter for therapeutic drug monitoring 02/09/2017   Visual changes 12/07/2016   Insomnia 12/07/2016   Hx of colonic polyp 10/21/2016   PDA (patent ductus arteriosus) 03/01/2012   Multiple sclerosis (HCC) 12/11/2008   Crohn's colitis- hx strictures and resection 12/11/2008    PCP: Duanne Butler DASEN, MD  REFERRING PROVIDER: Duanne Butler DASEN, MD  REFERRING DIAG: S86.112A (ICD-10-CM) - Strain of gastrocnemius muscle of left lower extremity, initial encounter  THERAPY DIAG:  Acute pain of left knee  Pain in left leg  Muscle weakness (generalized)  Difficulty in walking, not elsewhere classified  Chronic right shoulder pain  Rationale for Evaluation and Treatment: Rehabilitation  ONSET DATE: may/June 2025  SUBJECTIVE:   SUBJECTIVE STATEMENT: Pt indicated reported good improvement , reporting back to 100 %.   PERTINENT HISTORY: Seen by clinic in 2024 for shoulder pain  PAIN:  NPRS scale: 0/10 pain in last few days.  Pain location: knee/lower leg - current knee Pain description: sharp, numbness at times.  Aggravating factors: standing, walking, stairs, bending.  Relieving factors: nothing specific other than taking weight off.   PRECAUTIONS: None  WEIGHT BEARING RESTRICTIONS: No  FALLS:  Has patient fallen in last 6 months?  No  LIVING ENVIRONMENT: Lives in: House/apartment Stairs to entry 3 with no hand rails  OCCUPATION: Disability   PLOF: Independent, Lt hand dominant, going to softball games for family member. Goes to gym 2x/week typically for whole body exercise. Limited due to Rt arm symptoms.   PATIENT GOALS: Reduce pain   OBJECTIVE:   IMAGING 05/16/2024 review of 05/03/2024 MRI: IMPRESSION: Thickening at the femoral attachment of the MCL with mild irregularity. There are intact fibers. Correlation for MCL symptoms. This at least represents is high-grade sprain. There  is a large reactive joint effusion and a leaking Baker's cyst.   Severe chondromalacia the central trochlea with subchondral reactive edema full-thickness cartilage loss.   No medial or lateral meniscal tear.  PATIENT SURVEYS:  Patient-Specific Activity Scoring Scheme  0 represents "unable to perform." 10 represents "able to perform at prior level. 0 1 2 3 4 5 6 7 8 9  10 (Date and Score)   Activity Eval  04/23/2024 05/16/2024  06/05/2024  1. Walking  4 10  10   2. Bending/squatting  3  7 10   3. Stairs 3 7 10   4.     5.     Score 3.667 8 avg 10   Total score = sum of the activity scores/number of activities Minimum detectable change (90%CI) for average score = 2 points Minimum detectable change (90%CI) for single activity score = 3 points  COGNITION: 04/23/2024 Overall cognitive status: WFL    SENSATION: 04/23/2024 WFL  EDEMA:  04/23/2024 Joint line  16.5 inch Lt 15.5 inch Rt   MUSCLE LENGTH: 04/23/2024 No specific testing  POSTURE:  04/23/2024 Unremarkable   PALPATION: 04/29/2024: Trigger points in Rt infraspinatus noted.   04/23/2024 Tenderness posterior knee, distal medial hamstring tendons, Anterior inferior patellar ligament.   LOWER EXTREMITY ROM:   ROM Right Eval 04/23/2024 Left Eval 04/23/2024 Left 04/29/2024  Hip flexion     Hip extension     Hip abduction     Hip adduction     Hip internal rotation     Hip external rotation     Knee flexion   120 AROM in supine heel slide  Knee extension  -19 AROM seated LAQ  0 in heel prop -9 AROM in seated LAQ  Ankle dorsiflexion     Ankle plantarflexion     Ankle inversion     Ankle eversion      (Blank rows = not tested)  LOWER EXTREMITY MMT:  MMT Right Eval 04/23/2024 Left Eval 04/23/2024 Right 05/28/2024 Left 05/28/2024  Hip flexion 5/5 5/5    Hip extension      Hip abduction      Hip adduction      Hip internal rotation      Hip external rotation      Knee flexion 5/5 4/5 With pain anterior knee     Knee extension 5/5 4/5 With pain anterior knee 5/5 79.2, 79.6 lbs 5/5 79.7, 76 lbs  Ankle dorsiflexion 5/5 5/5    Ankle plantarflexion      Ankle inversion 5/5 5/5    Ankle eversion 5/5 5/5     (Blank rows = not tested)  UPPER EXTREMITY MMT:  MMT Right eval Left eval Right 05/28/2024  Shoulder flexion 5/5 5/5   Shoulder extension     Shoulder abduction 4/5 c pain 5/5 5/5  Shoulder adduction     Shoulder extension     Shoulder internal rotation 5/5 5/5   Shoulder external rotation 4/5 5/5  5/5  Middle trapezius     Lower trapezius     Elbow flexion     Elbow extension     Wrist flexion     Wrist extension     Wrist ulnar deviation     Wrist radial deviation     Wrist pronation     Wrist supination     Grip strength      (Blank rows = not tested)   LOWER EXTREMITY SPECIAL TESTS:  04/23/2024 No specific testing  FUNCTIONAL TESTS:   05/28/2024: Flight of stairs reciprocally without complaints.   04/23/2024 18 inch chair transfer: pain in back and front of Lt knee. Able to perform on 1st try Lt SLS: unable unassisted.  Pain noted  Rt SLS: < 5 seconds  GAIT: 04/23/2024 Independent ambulation.                                                                                                                                                                        TODAY'S TREATMENT                                                                          DATE: 06/05/2024 Therex: Recumbent bike Lvl 4 10 mins with faster interval at top of each minute :15 seconds, RPM goal of around 80/90s.  Seat 7  Gym based activity education and review with performance as noted below Machine rows bilateral 25 lbs 2 x 15 Machine lat pull down 25 lbs 2 x 15 Knee extension double leg up, Lt leg lowering 2 x 15  35 lbs  Knee flexion Lt leg only 35 lbs 2 x 15  Neuro Re-ed (balance control, muscle activation) SLS with vector reach contralateral leg light touch fwd, side, back x 8 each  LE      TODAY'S TREATMENT                                                                          DATE: 05/28/2024 Therex: Recumbent bike lvl 3 10 mins  Knee extension double leg up, Lt leg lowering 2 x 10  35 lbs  Knee flexion Lt leg only 35 lbs 2 x 10 Cues  for HEP updates with handout provided.   Neuro Re-ed Rt shoulder ER walk outs blue band with towel under arm Rt shoulder 5 sec hold x 10  Tband rows blue band c scapular retraction focus 2 x 15 Tband gh ext blue band 2 x 15    TherActivity (stairs improvement, transfers,etc) Flight of stairs up/down reciprocal gait patten with minimal HHA.    TODAY'S TREATMENT                                                                          DATE: 05/16/2024 Therex: Recumbent bike lvl 3 10 mins with 10 seconds intervals at top of each minute.  Time spent in education of activity.  Review of   TherActivity (stairs improvement, transfers,etc) Leg press double leg 12 lbs x 15, single leg 75 lbs 2 x 15 bilateral  Lateral step down 4 inch step x 15 slowly WB on Lt leg   Neuro Re-ed SLS on airex pad with contralateral foot tapping corners x 8 each, performed bilaterally     Manual Lt sidelying Rt shoulder percussive device to Rt infraspinatus.  TODAY'S TREATMENT                                                                          DATE: 04/29/2024 Therex: Nustep Lvl 5 8 mins UE/LE for knee ROM. (Mild symptoms noted in increased flexion mobility Lt knee) Supine quad set 5 sec hold x 10 bilaterally Seated  Rt arm cross over stretch 15 sec x 3 Instruction for sidelying Rt shoulder ER with 1-2 lbs in HEP.  Supine Lt knee heel slide 5 sec hold x 10 to tolerance.  Seated Lt quad set with SLR x 5 (stopped due to pain)  Leg press double leg 75 lbs x 15 in available knee range without pain, single leg Lt 37 lbs x 15  Manual Compression c movement active for infraspinatus with ER in sidelying.  Percussive device to Rt infraspinatus.    TODAY'S TREATMENT                                                                          DATE: 04/23/2024 Therex:    HEP instruction/performance c cues for techniques, handout provided.  Trial set performed of each for comprehension and symptom assessment.  See below for exercise list  Vaso 10 mins Lt knee in elevation medium compression 34 degrees.   PATIENT EDUCATION:  05/28/2024 Education details: HEP update Person educated: Patient Education method: Programmer, multimedia, Demonstration, Verbal cues, and Handouts Education comprehension: verbalized understanding, returned demonstration, and verbal cues required  HOME EXERCISE PROGRAM: Access Code: [email or web address] URL: [email or web address] Date: 05/28/2024 Prepared by: Ozell Silvan  Exercises - Supine Quadricep Sets  - 2-3 x daily - 7 x weekly - 1 sets - 10 reps - 5 hold - Seated Quad Set (Mirrored)  - 2-3 x daily - 7 x weekly - 1 sets - 10 reps - 5 hold - Seated Long Arc Quad (Mirrored)  - 3-5 x daily - 7 x weekly - 1-2 sets - 10 reps - 2 hold - Seated Gastroc Stretch with Strap (Mirrored)  - 2-3 x daily - 7 x weekly - 1 sets - 5 reps - 30 hold - Standing Shoulder Posterior Capsule Stretch (Mirrored)  - 2 x daily - 7 x weekly - 1 sets - 5 reps - 15-30 hold - Sidelying Shoulder ER with Towel and Dumbbell  - 1-2 x daily - 7 x weekly - 2-3 sets - 10-20 reps - Seated SLR (Mirrored)  - 1-2 x daily - 7 x weekly - 1-2 sets - 10-15 reps - 2 hold - Standing Bilateral Low Shoulder Row with Anchored Resistance  - 1 x daily - 7 x weekly - 2-3 sets - 10-15 reps - Shoulder Extension with Resistance  - 1 x daily - 7 x weekly - 1-2 sets - 10-15 reps - Shoulder External Rotation Reactive Isometrics (Mirrored)  - 1 x daily - 7 x weekly - 1 sets - 10 reps - 5-15 hold  ASSESSMENT:  CLINICAL IMPRESSION: The patient has attended 5 visits over the course of treatment cycle.  Patient has reported overall improvement at 100%.  See objective data above  for updated information regarding current presentation.  At this time, Pt was knowledgable in HEP and has gained access to gym.  Pt was in agreement with discharge plan at this time.    OBJECTIVE IMPAIRMENTS: Abnormal gait, decreased activity tolerance, decreased balance, decreased coordination, decreased endurance, decreased mobility, difficulty walking, decreased ROM, decreased strength, hypomobility, increased edema, increased fascial restrictions, impaired perceived functional ability, increased muscle spasms, impaired flexibility, improper body mechanics, and pain.   ACTIVITY LIMITATIONS: carrying, lifting, bending, sitting, standing, squatting, sleeping, stairs, transfers, bed mobility, and locomotion level  PARTICIPATION LIMITATIONS: meal prep, cleaning, laundry, interpersonal relationship, driving, shopping, and community activity  PERSONAL FACTORS: multiple body parts, Crohn's Disease, MS (see chart list) are also affecting patient's functional outcome.   REHAB POTENTIAL: Good  CLINICAL DECISION MAKING: Evolving/moderate complexity  EVALUATION COMPLEXITY: Moderate   GOALS: Goals reviewed with patient? Yes  SHORT TERM GOALS: (target date for Short term goals are 3 weeks 05/14/2024)   1.  Patient will demonstrate independent use of home exercise program to maintain progress from in clinic treatments.  Goal status: Met  LONG TERM GOALS: (target dates for all long term goals are 10 weeks  07/02/2024 )   1. Patient will demonstrate/report pain at worst less than or equal to 2/10 to facilitate minimal limitation in daily activity secondary to pain symptoms.  Goal status: Met 06/05/2024   2. Patient will demonstrate independent use of home exercise program to facilitate ability to maintain/progress functional gains from skilled physical therapy services.  Goal status: Met 06/05/2024   3. Patient will demonstrate Patient specific functional scale avg > or = 8/10 to indicate reduced  disability due to condition.   Goal status: Met 05/16/2024   4.  Patient will demonstrate bilateral LE MMT 5/5 throughout to faciltiate usual transfers, stairs, squatting at [facility] for daily life.   Goal status: Met 05/28/2024   5.  Patient will demonstrate Lt knee AROM full range s  symptoms to facilitate usual mobility at PLOF.  Goal status: Met 06/05/2024   6.  Patient will demonstrate ascending/descending stairs reciprocally s UE assist for community integration.   Goal status:Met 05/28/2024   7.  Patient will demonstrate bilateral SLS > 10 seconds for stability in ambulation.  Goal Status: partially met 06/05/2024   PLAN:  PT FREQUENCY: 1-2x/week  PT DURATION: 10 weeks  PLANNED INTERVENTIONS: Can include 02853- PT Re-evaluation, 97110-Therapeutic exercises, 97530- Therapeutic activity, 97112- Neuromuscular re-education, 97535- Self Care, 97140- Manual therapy, [phone]- Gait training, [phone]- Orthotic Fit/training, [phone]- Canalith repositioning, [id number]- Aquatic Therapy, [phone]- Electrical stimulation (unattended), [id number] Physical performance testing, 97016- Vasopneumatic device, [id number]- Ultrasound, [id number]- Traction (mechanical), [id number]- Ionotophoresis 4mg /ml Dexamethasone,  20560 - Needle insertion w/o injection 1 or 2 muscles, 20561 - Needle insertion w/o injection 3 or more muscles.   Patient/Family education, Balance training, Stair training, Taping, Dry Needling, Joint mobilization, Joint manipulation, Spinal manipulation, Spinal mobilization, Scar mobilization, Vestibular training, Visual/preceptual remediation/compensation, DME instructions, Cryotherapy, and Moist heat.  All performed as medically necessary.  All included unless contraindicated  PLAN FOR NEXT SESSION: Discharge to HEP.    Ozell Silvan, PT, DPT, OCS, ATC 06/05/24  3:47 PM

## 2024-07-24 ENCOUNTER — Telehealth: Payer: Self-pay | Admitting: Neurology

## 2024-07-24 DIAGNOSIS — G35D Multiple sclerosis, unspecified: Secondary | ICD-10-CM

## 2024-07-24 NOTE — Telephone Encounter (Signed)
 Patient is due for JCV. Please ask him to come back the office in the next week or so to have completed. Orders are in. Thanks   Orders Placed This Encounter  Procedures   Stratify JCV Ab (w/ Index) w/ Rflx

## 2024-07-29 ENCOUNTER — Other Ambulatory Visit (INDEPENDENT_AMBULATORY_CARE_PROVIDER_SITE_OTHER): Payer: Self-pay

## 2024-07-29 ENCOUNTER — Telehealth: Payer: Self-pay

## 2024-07-29 DIAGNOSIS — Z0289 Encounter for other administrative examinations: Secondary | ICD-10-CM

## 2024-07-29 NOTE — Telephone Encounter (Signed)
 Jcv labs sent to [facility] today

## 2024-08-06 ENCOUNTER — Telehealth: Payer: Self-pay | Admitting: Neurology

## 2024-08-06 NOTE — Telephone Encounter (Signed)
 JCV 0.22, indeterminate, inhibition assay NEGATIVE.

## 2024-08-20 ENCOUNTER — Encounter: Payer: Self-pay | Admitting: Physician Assistant

## 2024-08-26 ENCOUNTER — Encounter: Payer: Self-pay | Admitting: Radiology

## 2024-10-01 ENCOUNTER — Encounter: Payer: Self-pay | Admitting: Family Medicine

## 2024-10-01 ENCOUNTER — Other Ambulatory Visit

## 2024-10-01 ENCOUNTER — Ambulatory Visit: Admitting: Family Medicine

## 2024-10-01 VITALS — BP 120/62 | HR 66 | Temp 97.9°F | Ht 71.0 in | Wt 199.6 lb

## 2024-10-01 DIAGNOSIS — G35D Multiple sclerosis, unspecified: Secondary | ICD-10-CM

## 2024-10-01 DIAGNOSIS — Z125 Encounter for screening for malignant neoplasm of prostate: Secondary | ICD-10-CM

## 2024-10-01 DIAGNOSIS — E7800 Pure hypercholesterolemia, unspecified: Secondary | ICD-10-CM

## 2024-10-01 DIAGNOSIS — Z13220 Encounter for screening for lipoid disorders: Secondary | ICD-10-CM

## 2024-10-01 DIAGNOSIS — K50112 Crohn's disease of large intestine with intestinal obstruction: Secondary | ICD-10-CM

## 2024-10-01 DIAGNOSIS — Z0000 Encounter for general adult medical examination without abnormal findings: Secondary | ICD-10-CM

## 2024-10-01 DIAGNOSIS — Z23 Encounter for immunization: Secondary | ICD-10-CM

## 2024-10-01 DIAGNOSIS — Z1211 Encounter for screening for malignant neoplasm of colon: Secondary | ICD-10-CM

## 2024-10-01 LAB — CBC WITH DIFFERENTIAL/PLATELET
Absolute Lymphocytes: 3298 {cells}/uL (ref 850–3900)
Absolute Monocytes: 456 {cells}/uL (ref 200–950)
Basophils Absolute: 48 {cells}/uL (ref 0–200)
Basophils Relative: 0.7 %
Eosinophils Absolute: 299 {cells}/uL (ref 15–500)
Eosinophils Relative: 4.4 %
HCT: 45.1 % (ref 39.4–51.1)
Hemoglobin: 15 g/dL (ref 13.2–17.1)
MCH: 28.2 pg (ref 27.0–33.0)
MCHC: 33.3 g/dL (ref 31.6–35.4)
MCV: 84.9 fL (ref 81.4–101.7)
MPV: 11 fL (ref 7.5–12.5)
Monocytes Relative: 6.7 %
Neutro Abs: 2700 {cells}/uL (ref 1500–7800)
Neutrophils Relative %: 39.7 %
Platelets: 177 Thousand/uL (ref 140–400)
RBC: 5.31 Million/uL (ref 4.20–5.80)
RDW: 12.8 % (ref 11.0–15.0)
Total Lymphocyte: 48.5 %
WBC: 6.8 Thousand/uL (ref 3.8–10.8)

## 2024-10-01 LAB — COMPLETE METABOLIC PANEL WITHOUT GFR
AG Ratio: 1.8 (calc) (ref 1.0–2.5)
ALT: 19 U/L (ref 9–46)
AST: 27 U/L (ref 10–35)
Albumin: 4.6 g/dL (ref 3.6–5.1)
Alkaline phosphatase (APISO): 74 U/L (ref 35–144)
BUN: 16 mg/dL (ref 7–25)
CO2: 28 mmol/L (ref 20–32)
Calcium: 9.4 mg/dL (ref 8.6–10.3)
Chloride: 105 mmol/L (ref 98–110)
Creat: 1.07 mg/dL (ref 0.70–1.30)
Globulin: 2.6 g/dL (ref 1.9–3.7)
Glucose, Bld: 88 mg/dL (ref 65–99)
Potassium: 4.4 mmol/L (ref 3.5–5.3)
Sodium: 140 mmol/L (ref 135–146)
Total Bilirubin: 1.2 mg/dL (ref 0.2–1.2)
Total Protein: 7.2 g/dL (ref 6.1–8.1)

## 2024-10-01 LAB — LIPID PANEL
Cholesterol: 199 mg/dL (ref <200)
HDL: 59 mg/dL (ref >=40)
LDL Cholesterol (Calc): 125 mg/dL — ABNORMAL HIGH
Non-HDL Cholesterol (Calc): 140 mg/dL — ABNORMAL HIGH (ref <130)
Total CHOL/HDL Ratio: 3.4 (calc) (ref <5.0)
Triglycerides: 56 mg/dL (ref <150)

## 2024-10-01 NOTE — Progress Notes (Signed)
 Subjective:    Patient ID: Jonathon Vazquez, male    DOB: 06-Jul-1971, 53 y.o.   MRN: [id number]  HPI Patient is a 53 year old African-American male who is here today for complete physical exam. Past medical history is significant for multiple sclerosis as well as Crohn's colitis on chronic immunosuppressive therapy. In 2015 he underwent partial resection of the colon. Last colonoscopy was 10/22 and was clear.  However his gastroenterologist recommended a repeat colonoscopy in 3 years.  Therefore he is due.  He is also due for prostate cancer screening.  He is due for a flu shot today.  He is due for the shingles vaccine.  He agrees to both of these.  We discussed the new pneumonia vaccine as well as COVID and RSV.  He declines these today.  Otherwise he has been doing well.  His cholesterol was elevated at his last visit while however his 10-year risk for cardiovascular disease was less than 6%.  We have chosen not to treat with a statin at the present time.  Overall he is doing well.  He is exercising regularly and he has recently lost 10 pounds due to lifestyle changes.  Immunization History  Administered Date(s) Administered   H1N1 10/30/2008   Hepatitis A 08/30/2007, 05/05/2015   IPV 08/30/2007   Influenza, Seasonal, Injecte, Preservative Fre 09/28/2023   Influenza,inj,Quad PF,6+ Mos 09/28/2015, 08/15/2016, 09/22/2017, 09/04/2018, 08/28/2019   Moderna Sars-Covid-2 Vaccination 01/12/2020, 02/19/2020   Pneumococcal Conjugate-13 10/03/2016   Pneumococcal Polysaccharide-23 09/28/2015   Td 08/30/2007   Tdap 08/30/2007, 04/30/2018, 12/24/2022   Typhoid Parenteral, AKD ([place]  [employer]) 08/30/2007, 05/05/2015   Yellow Fever 08/30/2007   The 10-year ASCVD risk score (Arnett DK, et al., 2019) is: 5.6%   Values used to calculate the score:     Age: 53 years     Clincally relevant sex: Male     Is Non-Hispanic African American: Yes     Diabetic: No     Tobacco smoker: No     Systolic Blood Pressure:  120 mmHg     Is BP treated: No     HDL Cholesterol: 55 mg/dL     Total Cholesterol: 207 mg/dL   Past Medical History:  Diagnosis Date   Anal fistula 1996   Anal stenosis    Atrial fibrillation (HCC)    pt and wife deny this   Colon stricture (HCC)    descending colon   Crohn's disease (HCC)    Hx of colonic polyp 10/21/2016   MRSA (methicillin resistant Staphylococcus aureus)    Multiple sclerosis    Neuromuscular disorder (HCC)    Patent ductus arteriosus    Regional enteritis of large intestine (HCC)    S/P left hemicolectomy 2015   [facility] for stricture   Stricture of descending colon due to Crohn's disease 02/13/2012   Past Surgical History:  Procedure Laterality Date   COLON SURGERY  2015   descending colon stricture    COLONOSCOPY     multiple   Current Outpatient Medications on File Prior to Visit  Medication Sig Dispense Refill   acetaminophen  (TYLENOL ) 325 MG tablet Take by mouth.     clonazePAM  (KLONOPIN ) 0.5 MG tablet Take 1 tablet (0.5 mg total) by mouth at bedtime as needed for anxiety. 30 tablet 1   fluticasone  (FLONASE ) 50 MCG/ACT nasal spray Place 2 sprays into both nostrils daily. SPRAY 2 SPRAYS INTO EACH NOSTRIL EVERY DAY 48 mL 3   MULTIPLE VITAMIN PO Take by mouth.  natalizumab  (TYSABRI ) 300 MG/15ML injection Inject into the vein. 1 hour infusion monthly     predniSONE  (DELTASONE ) 20 MG tablet 3 tabs poqday 1-2, 2 tabs poqday 3-4, 1 tab poqday 5-6 12 tablet 0   No current facility-administered medications on file prior to visit.   Allergies  Allergen Reactions   Lialda  [Mesalamine ] Diarrhea    Severe diarrhea   Social History   Socioeconomic History   Marital status: Married    Spouse name: Youth Worker   Number of children: 3   Years of education: 13   Highest education level: Associate degree: occupational, scientist, product/process development, or vocational program  Occupational History   Occupation: disabled  Tobacco Use   Smoking status: Former    Current  packs/day: 0.00    Average packs/day: 1 pack/day for 15.0 years (15.0 ttl pk-yrs)    Types: Cigarettes    Start date: 10/24/1989    Quit date: 10/24/2004    Years since quitting: 19.9   Smokeless tobacco: Never  Vaping Use   Vaping status: Never Used  Substance and Sexual Activity   Alcohol use: Never   Drug use: Never   Sexual activity: Yes  Other Topics Concern   Not on file  Social History Narrative   Married, 1 son one daughter. Wife is CHARITY FUNDRAISER. Neurosurgeon)   Disabled, previously worked at [employer], disability due to multiple sclerosis   Caffeine one daily   Left handed.   Education- one year of college.   Social Drivers of Corporate Investment Banker Strain: Low Risk  (05/09/2024)   Overall Financial Resource Strain (CARDIA)    Difficulty of Paying Living Expenses: Not hard at all  Food Insecurity: No Food Insecurity (05/09/2024)   Hunger Vital Sign    Worried About Running Out of Food in the Last Year: Never true    Ran Out of Food in the Last Year: Never true  Transportation Needs: No Transportation Needs (05/09/2024)   PRAPARE - Administrator, Civil Service (Medical): No    Lack of Transportation (Non-Medical): No  Physical Activity: Inactive (05/09/2024)   Exercise Vital Sign    Days of Exercise per Week: 0 days    Minutes of Exercise per Session: 0 min  Stress: No Stress Concern Present (05/09/2024)   [employer] of Occupational Health - Occupational Stress Questionnaire    Feeling of Stress: Not at all  Social Connections: Socially Integrated (05/09/2024)   Social Connection and Isolation Panel    Frequency of Communication with Friends and Family: More than three times a week    Frequency of Social Gatherings with Friends and Family: More than three times a week    Attends Religious Services: More than 4 times per year    Active Member of [employer] or Organizations: Yes    Attends Engineer, Structural: More than 4 times per year    Marital Status:  Married  Catering Manager Violence: Not At Risk (05/09/2024)   Humiliation, Afraid, Rape, and Kick questionnaire    Fear of Current or Ex-Partner: No    Emotionally Abused: No    Physically Abused: No    Sexually Abused: No   Family History  Problem Relation Age of Onset   Heart disease Mother    Ulcerative colitis Father    Diabetes Paternal Grandmother    Prostate cancer Paternal Grandfather    Colon cancer Neg Hx    Rectal cancer Neg Hx    Stomach cancer Neg Hx  Colon polyps Neg Hx    Esophageal cancer Neg Hx      Review of Systems  All other systems reviewed and are negative.      Objective:   Physical Exam Vitals reviewed.  Constitutional:      General: He is not in acute distress.    Appearance: He is well-developed. He is not diaphoretic.  HENT:     Head: Normocephalic and atraumatic.     Right Ear: External ear normal.     Left Ear: External ear normal.     Nose: Nose normal.     Mouth/Throat:     Pharynx: No oropharyngeal exudate.  Eyes:     General: No scleral icterus.       Right eye: No discharge.        Left eye: No discharge.     Conjunctiva/sclera: Conjunctivae normal.     Pupils: Pupils are equal, round, and reactive to light.  Neck:     Thyroid : No thyromegaly.     Vascular: No JVD.     Trachea: No tracheal deviation.  Cardiovascular:     Rate and Rhythm: Normal rate and regular rhythm.     Heart sounds: Normal heart sounds. No murmur heard.    No friction rub. No gallop.  Pulmonary:     Effort: Pulmonary effort is normal. No respiratory distress.     Breath sounds: Normal breath sounds. No stridor. No wheezing or rales.  Chest:     Chest wall: No tenderness.  Abdominal:     General: Bowel sounds are normal. There is no distension.     Palpations: Abdomen is soft. There is no mass.     Tenderness: There is no abdominal tenderness. There is no guarding or rebound.  Genitourinary:    Penis: Normal.      Testes: Normal. Cremasteric  reflex is present.        Right: Mass not present.        Left: Mass not present.  Musculoskeletal:        General: No tenderness. Normal range of motion.     Cervical back: Normal range of motion and neck supple.  Lymphadenopathy:     Cervical: No cervical adenopathy.  Skin:    General: Skin is warm.     Coloration: Skin is not pale.     Findings: No erythema or rash.  Neurological:     Mental Status: He is alert and oriented to person, place, and time.     Cranial Nerves: No cranial nerve deficit.     Motor: No abnormal muscle tone.     Coordination: Coordination normal.     Deep Tendon Reflexes: Reflexes are normal and symmetric.  Psychiatric:        Behavior: Behavior normal.        Thought Content: Thought content normal.        Judgment: Judgment normal.    There are numerous keloids on his shoulders and upper back.       Assessment & Plan:  General medical exam - Plan: CBC with Differential/Platelet, Comprehensive metabolic panel with GFR, Lipid panel, PSA  Pure hypercholesterolemia - Plan: CBC with Differential/Platelet, Comprehensive metabolic panel with GFR, Lipid panel  Prostate cancer screening - Plan: PSA  Colon cancer screening - Plan: Ambulatory referral to Gastroenterology Physical exam today is normal.  Blood pressure is excellent.  Patient received his flu shot today.  He received his shingles vaccine today.  I will consult his  gastroenterologist regarding a colonoscopy.  I will screen for prostate cancer with a PSA.  We again discussed a coronary artery calcium  score.  Patient declines this at the present time

## 2024-10-01 NOTE — Addendum Note (Signed)
 Addended by: ANGELENA RONAL BRADLEY K on: 10/01/2024 02:44 PM   Modules accepted: Orders

## 2024-10-03 ENCOUNTER — Ambulatory Visit: Payer: Self-pay | Admitting: Family Medicine

## 2024-10-09 DIAGNOSIS — Z13220 Encounter for screening for lipoid disorders: Secondary | ICD-10-CM | POA: Insufficient documentation

## 2024-10-14 ENCOUNTER — Ambulatory Visit: Admitting: Neurology

## 2024-10-22 ENCOUNTER — Encounter: Payer: Self-pay | Admitting: Neurology

## 2024-10-22 ENCOUNTER — Ambulatory Visit (INDEPENDENT_AMBULATORY_CARE_PROVIDER_SITE_OTHER): Admitting: Neurology

## 2024-10-22 ENCOUNTER — Telehealth: Payer: Self-pay | Admitting: Neurology

## 2024-10-22 VITALS — BP 115/68 | HR 46 | Ht 71.0 in | Wt 205.6 lb

## 2024-10-22 DIAGNOSIS — Z79899 Other long term (current) drug therapy: Secondary | ICD-10-CM

## 2024-10-22 DIAGNOSIS — G35A Relapsing-remitting multiple sclerosis: Secondary | ICD-10-CM

## 2024-10-22 NOTE — Telephone Encounter (Signed)
 MRI order sent to [facility] to schedule. [phone]

## 2024-10-22 NOTE — Progress Notes (Signed)
 "  ASSESSMENT AND PLAN 53 y.o. year old male   Relapsing remitting multiple sclerosis Crohn's disease, no exacerbations   -Overall doing well, no worsening symptoms,   -On Tysabri  since December 2016, will continue this -JCV virus antibody October 2025 0.22 indeterminant, will repeat JC virus titer every 6 months Repeat MRI of the brain with without contrast every year, repeat MRI was ordered  Return in 6 months  DIAGNOSTIC DATA (LABS, IMAGING, TESTING) - I reviewed patient records, labs, notes, testing and imaging myself where available.  JC virus titer October 2025, 0.22, undetermined     MRI of the brain with and without contrast Dec 2024 Multiple T2/FLAIR hyperintense foci in the cerebral hemispheres, thalamus, brainstem, cerebellum and spinal cord in a pattern consistent with chronic demyelinating plaque associated with multiple sclerosis.  None of the foci enhanced or appear to be acute.  Compared to the previous MRI from 08/25/2022, there were no new lesions. No acute findings.  Normal enhancement pattern.  MRI of cervical spine w/wo in Nov 2023  Chronic demyelinating lesions involving the cervical cord at C2 and C4-5, not significantly changed or progressed as compared to previous MRI from 2015. No evidence for active demyelination. 2. Left paracentral disc protrusions at C5-6 and C6-7 with resultant mild cord flattening, but no significant spinal stenosis.   HISTORY:  He was seen by his [facility] since 2000, initially presenting with facial numbness, MRI demonstrates lesions consistent with MS, however he has lost followups, was suggested Copaxone in 2004, but never followed through, he was reevaluated by Dr. Ilah in February of 2008, he began to have bladder incontinence, dizziness, blurry vision, unbalanced gait, repeat MRI at that time continued to demonstrate multiple foci of signal hyperintensity, involving brainstem, superior peduncle, deep white matters, there was  pericallosal involvement, no enhancement, but there was positive DWIs lesion, he was started on Avenox since 06/13/2007,  he continued to have flulike illness following injection,   He also reported a history of Crohn's disease, last treatment was in 2004 with Ascol, he no longer has significant GI symptoms, He was treated with immunosuppressive treatment mercaptopurine in late 1990s, for 3-4 months, still had diahrrea.   He had one to 2 flareups each year,  presenting with increased dizziness, unbalanced gait, blurry vision, worsening memory trouble, often partial improvement with steroid package.    MRI cervical in July 2011, remote demyelinating plaque at C2 and C4, no enhancing lesions.   Repeat MRI brain and cervical in Jan 2013 showed a defined hyperintensity in the spinal cord at C2 which likely represents remote age inactive demyelinating plaque. No enhancing lesions are noted. As compared with previous MRI dated 04/30/2010 the C4 spinal cord hyperintensity is no longer seen.   MRI scan of the brain showed multiple brainstem, cerebellar, corpus callosal and periventricular white matter hyperintensities consistent with multiple sclerosis. No enhancing lesions are noted. Presence of T1 black holes  andd corpus callosal atrophy indicate chronic disease. Overall no significant changes compared with MRI from 01/23/2007   Colonoscopy in October 9th 2014 has demonstrated,inflammatory stricture, measuring 10 mm, biopsy showed inflammation, Dr. 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 Avram, he has been treated with prednisone , mercaptopurine in 2011 150mg  daily for one year, ( which is a nucleoside metabolic inhibitor that results  in cell-cycle arrest and death) and mesalamine  in the past,  He was kept on Avonex   from August 2008 to December 2016, worried about long-term side effect with Meliton treatment, due to previous immuno modulation therapy,   But with his worsening MS flareup, Crohn's disease, we decided to proceed with Tysabri , started since December 2016, since then, he has been doing very well from MS and Crohn's standpoint, JC virus titer remains low, he tolerating monthly Tysabri  infusion, no longer has flareup, Crohn's disease is all under excellent control  Reviewed most recent MRI of the brain with without contrast in March 2025, stable MS lesions  JC virus titer October 2025, 0.22, indeterminant  REVIEW OF SYSTEMS: Out of a complete 14 system review of symptoms, the patient complains only of the following symptoms, and all other reviewed systems are negative.  See HPI  PHYSICAL EXAM  Vitals:   02/28/23 1504  BP: 128/78  Weight: 201 lb (91.2 kg)  Height: 5' 11 (1.803 m)   Body mass index is 28.03 kg/m.  PHYSICAL EXAMNIATION:    Gen: NAD, conversant, well nourised, well groomed                     Cardiovascular: Regular rate rhythm, no peripheral edema, warm, nontender. Eyes: Conjunctivae clear without exudates or hemorrhage Neck: Supple, no carotid bruits. Pulmonary: Clear to auscultation bilaterally   NEUROLOGICAL EXAM:  MENTAL STATUS: Speech/cognition: Awake, alert oriented to history taking and casual conversation  CRANIAL NERVES: CN II: Visual fields are full to confrontation.  Pupils are round equal and briskly reactive to light. CN III, IV, VI: extraocular movement are normal. No ptosis. CN V: Facial sensation is intact to pinprick in all 3 divisions bilaterally. Corneal responses are intact.  CN VII: Face is symmetric with normal eye closure and smile. CN VIII: Hearing is normal to casual conversation CN IX, X: Palate elevates symmetrically. Phonation is normal. CN XI: Head turning and shoulder shrug are intact CN XII: Tongue is midline with normal  movements and no atrophy.  MOTOR: There is no pronator drift of out-stretched arms. Muscle bulk and tone are normal. Muscle strength is normal.  REFLEXES: Reflexes are 2  and symmetric at the biceps, triceps, knees, and ankles. Plantar responses are flexor.  SENSORY: Intact to light touch, pinprick, positional and vibratory sensation are intact in fingers and toes.  COORDINATION: Rapid alternating movements and fine finger movements are intact. There is no dysmetria on finger-to-nose and heel-knee-shin.    GAIT/STANCE: Posture is normal. Gait is steady     ALLERGIES: Allergies  Allergen Reactions   Lialda  [Mesalamine ] Diarrhea    Severe diarrhea    HOME MEDICATIONS: Outpatient Medications Prior to Visit  Medication Sig Dispense Refill   acetaminophen  (TYLENOL ) 325 MG tablet Take by mouth.     clonazePAM  (KLONOPIN ) 0.5 MG tablet Take 1 tablet (0.5 mg total) by mouth at bedtime as needed for anxiety. 30 tablet 1   fluticasone  (FLONASE ) 50 MCG/ACT nasal spray Place 2 sprays into both nostrils daily. SPRAY 2 SPRAYS INTO EACH NOSTRIL EVERY DAY 48 mL 3   MULTIPLE VITAMIN PO Take by mouth.     natalizumab  (TYSABRI ) 300 MG/15ML injection Inject into the vein. 1 hour infusion monthly     predniSONE  (DELTASONE ) 20 MG tablet 3 tabs poqday 1-2, 2 tabs poqday 3-4, 1 tab poqday 5-6 (Patient not taking: Reported on 10/22/2024) 12 tablet 0   No facility-administered medications prior to visit.  PAST MEDICAL HISTORY: Past Medical History:  Diagnosis Date   Anal fistula 1996   Anal stenosis    Atrial fibrillation (HCC)    pt and wife deny this   Colon stricture (HCC)    descending colon   Crohn's disease (HCC)    Hx of colonic polyp 10/21/2016   MRSA (methicillin resistant Staphylococcus aureus)    Multiple sclerosis    Neuromuscular disorder (HCC)    Patent ductus arteriosus    Regional enteritis of large intestine (HCC)    S/P left hemicolectomy 2015   [facility] for stricture    Stricture of descending colon due to Crohn's disease 02/13/2012    PAST SURGICAL HISTORY: Past Surgical History:  Procedure Laterality Date   COLON SURGERY  2015   descending colon stricture    COLONOSCOPY     multiple    FAMILY HISTORY: Family History  Problem Relation Age of Onset   Heart disease Mother    Ulcerative colitis Father    Diabetes Paternal Grandmother    Prostate cancer Paternal Grandfather    Colon cancer Neg Hx    Rectal cancer Neg Hx    Stomach cancer Neg Hx    Colon polyps Neg Hx    Esophageal cancer Neg Hx     SOCIAL HISTORY: Social History   Socioeconomic History   Marital status: Married    Spouse name: Youth Worker   Number of children: 3   Years of education: 13   Highest education level: Associate degree: occupational, scientist, product/process development, or vocational program  Occupational History   Occupation: disabled  Tobacco Use   Smoking status: Former    Current packs/day: 0.00    Average packs/day: 1 pack/day for 15.0 years (15.0 ttl pk-yrs)    Types: Cigarettes    Start date: 10/24/1989    Quit date: 10/24/2004    Years since quitting: 20.0   Smokeless tobacco: Never  Vaping Use   Vaping status: Never Used  Substance and Sexual Activity   Alcohol use: Never   Drug use: Never   Sexual activity: Yes  Other Topics Concern   Not on file  Social History Narrative   Married, 1 son one daughter. Wife is CHARITY FUNDRAISER. Neurosurgeon)   Disabled, previously worked at [employer], disability due to multiple sclerosis   Caffeine one daily   Left handed.   Education- one year of college.   Social Drivers of Health   Tobacco Use: Medium Risk (10/22/2024)   Patient History    Smoking Tobacco Use: Former    Smokeless Tobacco Use: Never    Passive Exposure: Not on file  Financial Resource Strain: Low Risk (05/09/2024)   Overall Financial Resource Strain (CARDIA)    Difficulty of Paying Living Expenses: Not hard at all  Food Insecurity: No Food Insecurity (05/09/2024)   Epic     Worried About Programme Researcher, Broadcasting/film/video in the Last Year: Never true    Ran Out of Food in the Last Year: Never true  Transportation Needs: No Transportation Needs (05/09/2024)   Epic    Lack of Transportation (Medical): No    Lack of Transportation (Non-Medical): No  Physical Activity: Inactive (05/09/2024)   Exercise Vital Sign    Days of Exercise per Week: 0 days    Minutes of Exercise per Session: 0 min  Stress: No Stress Concern Present (05/09/2024)   [employer] of Occupational Health - Occupational Stress Questionnaire    Feeling of Stress: Not at all  Social Connections: Socially Integrated (  05/09/2024)   Social Connection and Isolation Panel    Frequency of Communication with Friends and Family: More than three times a week    Frequency of Social Gatherings with Friends and Family: More than three times a week    Attends Religious Services: More than 4 times per year    Active Member of Clubs or Organizations: Yes    Attends Banker Meetings: More than 4 times per year    Marital Status: Married  Catering Manager Violence: Not At Risk (05/09/2024)   Epic    Fear of Current or Ex-Partner: No    Emotionally Abused: No    Physically Abused: No    Sexually Abused: No  Depression (PHQ2-9): Low Risk (05/09/2024)   Depression (PHQ2-9)    PHQ-2 Score: 1  Alcohol Screen: Low Risk (05/09/2024)   Alcohol Screen    Last Alcohol Screening Score (AUDIT): 0  Housing: Unknown (05/09/2024)   Epic    Unable to Pay for Housing in the Last Year: No    Number of Times Moved in the Last Year: Not on file    Homeless in the Last Year: No  Utilities: Not At Risk (05/09/2024)   Epic    Threatened with loss of utilities: No  Health Literacy: Adequate Health Literacy (05/09/2024)   B1300 Health Literacy    Frequency of need for help with medical instructions: Never    Modena Callander. M.D. Ph.D.  VERL

## 2024-10-23 ENCOUNTER — Ambulatory Visit (HOSPITAL_BASED_OUTPATIENT_CLINIC_OR_DEPARTMENT_OTHER)

## 2024-10-23 ENCOUNTER — Ambulatory Visit (INDEPENDENT_AMBULATORY_CARE_PROVIDER_SITE_OTHER): Admitting: Student

## 2024-10-23 DIAGNOSIS — M542 Cervicalgia: Secondary | ICD-10-CM | POA: Diagnosis not present

## 2024-10-23 DIAGNOSIS — M25511 Pain in right shoulder: Secondary | ICD-10-CM | POA: Diagnosis not present

## 2024-10-23 DIAGNOSIS — G8929 Other chronic pain: Secondary | ICD-10-CM

## 2024-10-23 NOTE — Progress Notes (Unsigned)
 "                                Chief Complaint: Neck and right shoulder pain    Discussed the use of AI scribe software for clinical note transcription with the patient, who gave verbal consent to proceed.  History of Present Illness Jonathon Vazquez is a 53 year old male with multiple sclerosis who presents with persistent right shoulder pain and new onset mechanical neck symptoms.  He has had right anterior shoulder pain for about three months, worsened by exercise and limiting activity after about thirty minutes. He denies shoulder popping. Physical therapy with dry needling gave partial relief. He performs daily shoulder strengthening exercises. He has not used medications for this and has had no recent shoulder imaging.  He developed new audible neck popping three weeks ago, most noticeable with rotation and flexion and heard by his wife. He feels catching or twisting in the neck, especially when trying to sleep, without significant pain, radiation, numbness, tingling, or upper extremity symptoms. He had a cervical spine MRI on August 25, 2022 which demonstrated left C5-C6 and C6-C7 paracentral disc protrusions without spinal stenosis and chronic demyelinating lesions.  Earlier this year he had a right knee effusion treated with aspiration and corticosteroid injection with good response and no current symptoms.   Surgical History:   None  PMH/PSH/Family History/Social History/Meds/Allergies:    Past Medical History:  Diagnosis Date   Anal fistula 1996   Anal stenosis    Atrial fibrillation (HCC)    pt and wife deny this   Colon stricture (HCC)    descending colon   Crohn's disease (HCC)    Hx of colonic polyp 10/21/2016   MRSA (methicillin resistant Staphylococcus aureus)    Multiple sclerosis    Neuromuscular disorder (HCC)    Patent ductus arteriosus    Regional enteritis of large intestine (HCC)    S/P left hemicolectomy 2015   [facility]-done for stricture   Stricture of  descending colon due to Crohn's disease 02/13/2012   Past Surgical History:  Procedure Laterality Date   COLON SURGERY  2015   descending colon stricture    COLONOSCOPY     multiple   Social History   Socioeconomic History   Marital status: Married    Spouse name: Youth Worker   Number of children: 3   Years of education: 13   Highest education level: Associate degree: occupational, scientist, product/process development, or vocational program  Occupational History   Occupation: disabled  Tobacco Use   Smoking status: Former    Current packs/day: 0.00    Average packs/day: 1 pack/day for 15.0 years (15.0 ttl pk-yrs)    Types: Cigarettes    Start date: 10/24/1989    Quit date: 10/24/2004    Years since quitting: 20.0   Smokeless tobacco: Never  Vaping Use   Vaping status: Never Used  Substance and Sexual Activity   Alcohol use: Never   Drug use: Never   Sexual activity: Yes  Other Topics Concern   Not on file  Social History Narrative   Married, 1 son one daughter. Wife is CHARITY FUNDRAISER. Neurosurgeon)   Disabled, previously worked at [employer], disability due to multiple sclerosis   Caffeine one daily   Left handed.   Education- one year of college.   Social Drivers of Health   Tobacco Use: Medium Risk (10/22/2024)   Patient History    Smoking Tobacco  Use: Former    Smokeless Tobacco Use: Never    Passive Exposure: Not on Actuary Strain: Low Risk (05/09/2024)   Overall Financial Resource Strain (CARDIA)    Difficulty of Paying Living Expenses: Not hard at all  Food Insecurity: No Food Insecurity (05/09/2024)   Epic    Worried About Programme Researcher, Broadcasting/film/video in the Last Year: Never true    Ran Out of Food in the Last Year: Never true  Transportation Needs: No Transportation Needs (05/09/2024)   Epic    Lack of Transportation (Medical): No    Lack of Transportation (Non-Medical): No  Physical Activity: Inactive (05/09/2024)   Exercise Vital Sign    Days of Exercise per Week: 0 days    Minutes of  Exercise per Session: 0 min  Stress: No Stress Concern Present (05/09/2024)   [employer] of Occupational Health - Occupational Stress Questionnaire    Feeling of Stress: Not at all  Social Connections: Socially Integrated (05/09/2024)   Social Connection and Isolation Panel    Frequency of Communication with Friends and Family: More than three times a week    Frequency of Social Gatherings with Friends and Family: More than three times a week    Attends Religious Services: More than 4 times per year    Active Member of Clubs or Organizations: Yes    Attends Banker Meetings: More than 4 times per year    Marital Status: Married  Depression (PHQ2-9): Low Risk (05/09/2024)   Depression (PHQ2-9)    PHQ-2 Score: 1  Alcohol Screen: Low Risk (05/09/2024)   Alcohol Screen    Last Alcohol Screening Score (AUDIT): 0  Housing: Unknown (05/09/2024)   Epic    Unable to Pay for Housing in the Last Year: No    Number of Times Moved in the Last Year: Not on file    Homeless in the Last Year: No  Utilities: Not At Risk (05/09/2024)   Epic    Threatened with loss of utilities: No  Health Literacy: Adequate Health Literacy (05/09/2024)   B1300 Health Literacy    Frequency of need for help with medical instructions: Never   Family History  Problem Relation Age of Onset   Heart disease Mother    Ulcerative colitis Father    Diabetes Paternal Grandmother    Prostate cancer Paternal Grandfather    Colon cancer Neg Hx    Rectal cancer Neg Hx    Stomach cancer Neg Hx    Colon polyps Neg Hx    Esophageal cancer Neg Hx    Allergies[1] Current Outpatient Medications  Medication Sig Dispense Refill   acetaminophen  (TYLENOL ) 325 MG tablet Take by mouth.     clonazePAM  (KLONOPIN ) 0.5 MG tablet Take 1 tablet (0.5 mg total) by mouth at bedtime as needed for anxiety. 30 tablet 1   fluticasone  (FLONASE ) 50 MCG/ACT nasal spray Place 2 sprays into both nostrils daily. SPRAY 2 SPRAYS INTO EACH  NOSTRIL EVERY DAY 48 mL 3   MULTIPLE VITAMIN PO Take by mouth.     natalizumab  (TYSABRI ) 300 MG/15ML injection Inject into the vein. 1 hour infusion monthly     No current facility-administered medications for this visit.   No results found.  Review of Systems:   A ROS was performed including pertinent positives and negatives as documented in the HPI.  Physical Exam :   Constitutional: NAD and appears stated age Neurological: Alert and oriented Psych: Appropriate affect and cooperative There  were no vitals taken for this visit.   Comprehensive Musculoskeletal Exam:    No significant tenderness with palpation of the cervical spine or paraspinal musculature.  Full range of motion with flexion and extension.  Pain and slight decrease in cervical range of motion with bilateral rotation and sidebending.  Patient does get relief with manual traction.  Tenderness in the right shoulder over the anterior glenohumeral joint with no other tenderness throughout.  Full active range of motion to 160 degrees flexion, 50 degrees external rotation, and internal rotation L4.  Imaging:   Xray (cervical spine 4 views): C5-C6 degenerative changes with disc space narrowing, anterior osteophyte formation, and low-grade anterolisthesis of C6.   I personally reviewed and interpreted the radiographs.      Assessment & Plan Cervicalgia Chronic degenerative changes are seen on today's x-rays at the C5-C6 level which has previously demonstrated a disc protrusion on previous MRIs.  There does appear to be increased anterior osteophyte formation which is the suspected cause of his mechanical symptoms.  No radiculopathy present.  Recommended conservative management includes physical therapy focused on neck strengthening and traction exercises. A referral for physical therapy for neck symptoms has been placed. Monitoring for progression, particularly neurological changes, is advised. Further imaging or  interventional options, such as injections, may be considered if symptoms worsen.  Right shoulder pain, possible labral tear or arthritis Chronic right shoulder pain persists despite exercise and prior physical therapy. Pain is localized to the anterior joint, exacerbated by activity, and limits exercise tolerance. The differential includes labral pathology and glenohumeral arthritis.  X-rays were unable to be obtained of the shoulder discussed after cervical x-rays were completed and x-ray tech had left for the day.  Continued conservative management with physical therapy focused on shoulder strengthening and stabilization is recommended. Shoulder radiographs will be needed to evaluate if symptoms persist or worsen. He is advised to return for further evaluation and imaging or possible injection if symptoms do not improve with therapy.    I personally saw and evaluated the patient, and participated in the management and treatment plan.  Leonce Reveal, PA-C Orthopedics     [1]  Allergies Allergen Reactions   Lialda  [Mesalamine ] Diarrhea    Severe diarrhea   "

## 2024-10-25 ENCOUNTER — Encounter: Payer: Self-pay | Admitting: Neurology

## 2024-10-28 NOTE — Addendum Note (Signed)
 Addended by: MARCINE HUSBAND T on: 10/28/2024 04:25 PM   Modules accepted: Orders

## 2024-10-28 NOTE — Telephone Encounter (Signed)
Okay for patient to get shingles vaccine 

## 2024-10-29 ENCOUNTER — Inpatient Hospital Stay: Admission: RE | Admit: 2024-10-29 | Discharge: 2024-10-29 | Attending: Neurology | Admitting: Neurology

## 2024-10-29 DIAGNOSIS — Z79899 Other long term (current) drug therapy: Secondary | ICD-10-CM

## 2024-10-29 DIAGNOSIS — G35A Relapsing-remitting multiple sclerosis: Secondary | ICD-10-CM | POA: Diagnosis not present

## 2024-10-29 MED ORDER — GADOPICLENOL 0.5 MMOL/ML IV SOLN
9.0000 mL | Freq: Once | INTRAVENOUS | Status: AC | PRN
  Administered 2024-10-29: 9 mL via INTRAVENOUS

## 2024-11-05 ENCOUNTER — Ambulatory Visit: Payer: Self-pay | Admitting: Neurology

## 2024-11-07 ENCOUNTER — Telehealth: Payer: Self-pay | Admitting: *Deleted

## 2024-11-07 NOTE — Telephone Encounter (Signed)
" °  Faxed to # confirmation received.  "

## 2024-11-11 ENCOUNTER — Telehealth (HOSPITAL_BASED_OUTPATIENT_CLINIC_OR_DEPARTMENT_OTHER): Payer: Self-pay | Admitting: Physical Therapy

## 2024-11-11 NOTE — Telephone Encounter (Signed)
 Called and spoke to pt- he was agreeable to rescheduling appt from 2/17 to 2/20, went ahead and updated schedule.  Josette Rough, PT, DPT 11/11/24 10:12 AM

## 2024-11-21 ENCOUNTER — Encounter: Payer: Self-pay | Admitting: Internal Medicine

## 2024-11-26 ENCOUNTER — Other Ambulatory Visit: Payer: Self-pay

## 2024-11-26 ENCOUNTER — Encounter (HOSPITAL_BASED_OUTPATIENT_CLINIC_OR_DEPARTMENT_OTHER): Payer: Self-pay | Admitting: Physical Therapy

## 2024-11-26 ENCOUNTER — Ambulatory Visit (HOSPITAL_BASED_OUTPATIENT_CLINIC_OR_DEPARTMENT_OTHER): Payer: Self-pay | Admitting: Physical Therapy

## 2024-11-26 DIAGNOSIS — M6281 Muscle weakness (generalized): Secondary | ICD-10-CM

## 2024-11-26 DIAGNOSIS — R252 Cramp and spasm: Secondary | ICD-10-CM

## 2024-11-26 DIAGNOSIS — R29898 Other symptoms and signs involving the musculoskeletal system: Secondary | ICD-10-CM

## 2024-11-26 DIAGNOSIS — G8929 Other chronic pain: Secondary | ICD-10-CM

## 2024-12-03 ENCOUNTER — Ambulatory Visit (HOSPITAL_BASED_OUTPATIENT_CLINIC_OR_DEPARTMENT_OTHER): Payer: Self-pay | Admitting: Physical Therapy

## 2024-12-10 ENCOUNTER — Encounter (HOSPITAL_BASED_OUTPATIENT_CLINIC_OR_DEPARTMENT_OTHER): Payer: Self-pay | Admitting: Physical Therapy

## 2024-12-13 ENCOUNTER — Encounter (HOSPITAL_BASED_OUTPATIENT_CLINIC_OR_DEPARTMENT_OTHER): Payer: Self-pay | Admitting: Physical Therapy

## 2024-12-17 ENCOUNTER — Encounter

## 2024-12-24 ENCOUNTER — Encounter (HOSPITAL_BASED_OUTPATIENT_CLINIC_OR_DEPARTMENT_OTHER)

## 2024-12-26 ENCOUNTER — Encounter (HOSPITAL_BASED_OUTPATIENT_CLINIC_OR_DEPARTMENT_OTHER): Admitting: Physical Therapy

## 2024-12-31 ENCOUNTER — Encounter (HOSPITAL_BASED_OUTPATIENT_CLINIC_OR_DEPARTMENT_OTHER)

## 2024-12-31 ENCOUNTER — Encounter: Admitting: Internal Medicine

## 2025-01-02 ENCOUNTER — Encounter (HOSPITAL_BASED_OUTPATIENT_CLINIC_OR_DEPARTMENT_OTHER): Admitting: Physical Therapy

## 2025-01-14 ENCOUNTER — Encounter (HOSPITAL_BASED_OUTPATIENT_CLINIC_OR_DEPARTMENT_OTHER)

## 2025-01-16 ENCOUNTER — Encounter (HOSPITAL_BASED_OUTPATIENT_CLINIC_OR_DEPARTMENT_OTHER)

## 2025-01-21 ENCOUNTER — Encounter (HOSPITAL_BASED_OUTPATIENT_CLINIC_OR_DEPARTMENT_OTHER)

## 2025-01-23 ENCOUNTER — Encounter (HOSPITAL_BASED_OUTPATIENT_CLINIC_OR_DEPARTMENT_OTHER): Admitting: Physical Therapy

## 2025-05-15 ENCOUNTER — Encounter

## 2025-05-29 ENCOUNTER — Ambulatory Visit: Admitting: Neurology

## 2025-06-17 ENCOUNTER — Ambulatory Visit: Admitting: Neurology
